# Patient Record
Sex: Female | Born: 1995 | Race: White | Hispanic: No | Marital: Married | Smoking: Never smoker
Health system: Southern US, Community
[De-identification: ages and names within clinical notes are randomized; demographics above are authoritative.]

## PROBLEM LIST (undated history)

## (undated) ENCOUNTER — Inpatient Hospital Stay (HOSPITAL_COMMUNITY): Payer: Self-pay

## (undated) ENCOUNTER — Encounter

## (undated) DIAGNOSIS — K859 Acute pancreatitis without necrosis or infection, unspecified: Secondary | ICD-10-CM

## (undated) DIAGNOSIS — E11649 Type 2 diabetes mellitus with hypoglycemia without coma: Secondary | ICD-10-CM

## (undated) DIAGNOSIS — G35D Multiple sclerosis, unspecified: Secondary | ICD-10-CM

## (undated) DIAGNOSIS — E109 Type 1 diabetes mellitus without complications: Secondary | ICD-10-CM

## (undated) DIAGNOSIS — R000 Tachycardia, unspecified: Secondary | ICD-10-CM

## (undated) DIAGNOSIS — R011 Cardiac murmur, unspecified: Secondary | ICD-10-CM

## (undated) DIAGNOSIS — G35 Multiple sclerosis: Secondary | ICD-10-CM

## (undated) HISTORY — DX: Type 2 diabetes mellitus with hypoglycemia without coma: E11.649

## (undated) HISTORY — DX: Tachycardia, unspecified: R00.0

## (undated) HISTORY — DX: Morbid (severe) obesity due to excess calories: E66.01

## (undated) HISTORY — DX: Type 1 diabetes mellitus without complications: E10.9

## (undated) HISTORY — PX: CHOLECYSTECTOMY: SHX55

---
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
## 2015-02-22 ENCOUNTER — Encounter: Payer: Self-pay | Admitting: Endocrinology

## 2015-03-22 ENCOUNTER — Encounter: Payer: Self-pay | Admitting: Internal Medicine

## 2015-03-22 ENCOUNTER — Ambulatory Visit (INDEPENDENT_AMBULATORY_CARE_PROVIDER_SITE_OTHER): Payer: 59 | Admitting: Internal Medicine

## 2015-03-22 VITALS — BP 126/72 | HR 109 | Temp 98.2°F | Resp 12 | Ht 64.75 in | Wt 246.0 lb

## 2015-03-22 DIAGNOSIS — IMO0002 Reserved for concepts with insufficient information to code with codable children: Secondary | ICD-10-CM

## 2015-03-22 DIAGNOSIS — E1065 Type 1 diabetes mellitus with hyperglycemia: Secondary | ICD-10-CM

## 2015-03-22 LAB — COMPLETE METABOLIC PANEL WITH GFR
ALT: 30 U/L (ref 5–32)
AST: 24 U/L (ref 12–32)
Albumin: 3.8 g/dL (ref 3.6–5.1)
Alkaline Phosphatase: 50 U/L (ref 47–176)
BUN: 15 mg/dL (ref 7–20)
CO2: 19 mmol/L — ABNORMAL LOW (ref 20–31)
Calcium: 8.9 mg/dL (ref 8.9–10.4)
Chloride: 103 mmol/L (ref 98–110)
Creat: 0.62 mg/dL (ref 0.50–1.00)
GFR, Est African American: >89 mL/min (ref >=60)
GFR, Est Non African American: >89 mL/min (ref >=60)
Glucose, Bld: 265 mg/dL — ABNORMAL HIGH (ref 65–99)
Potassium: 4.2 mmol/L (ref 3.8–5.1)
Sodium: 136 mmol/L (ref 135–146)
Total Bilirubin: 0.5 mg/dL (ref 0.2–1.1)
Total Protein: 6.5 g/dL (ref 6.3–8.2)

## 2015-03-22 LAB — MICROALBUMIN / CREATININE URINE RATIO
Creatinine,U: 124.7 mg/dL
Microalb Creat Ratio: 1.7 mg/g (ref 0.0–30.0)
Microalb, Ur: 2.1 mg/dL — ABNORMAL HIGH (ref 0.0–1.9)

## 2015-03-22 LAB — LIPID PANEL
Cholesterol: 160 mg/dL (ref 0–200)
HDL: 35.3 mg/dL — ABNORMAL LOW (ref >39.00)
LDL Cholesterol: 95 mg/dL (ref 0–99)
NonHDL: 124.97
Total CHOL/HDL Ratio: 5
Triglycerides: 151 mg/dL — ABNORMAL HIGH (ref 0.0–149.0)
VLDL: 30.2 mg/dL (ref 0.0–40.0)

## 2015-03-22 LAB — HEMOGLOBIN A1C: Hgb A1c MFr Bld: 8 % — ABNORMAL HIGH (ref 4.6–6.5)

## 2015-03-22 LAB — TSH: TSH: 1.38 u[IU]/mL (ref 0.40–5.00)

## 2015-03-22 MED ORDER — HUMALOG 100 UNIT/ML ~~LOC~~ SOLN: 100.0000 [IU] | Freq: Three times a day (TID) | SUBCUTANEOUS | Status: DC

## 2015-03-22 MED ORDER — INSULIN GLARGINE 100 UNIT/ML ~~LOC~~ SOLN: 60.0000 [IU] | Freq: Every day | SUBCUTANEOUS | Status: DC

## 2015-03-22 NOTE — Patient Instructions (Signed)
Please continue: - Metformin 500 mg 2x a day - Lantus 60 units at bedtime   Please continue: - Humalog ICR 1:15, target 150, ISF 10 for lunch and dinner  Change: - Humalog ICR 1:12, target 130, ISF 10 for breakfast  If you have nausea/vomiting/diarrhea >> stop Metformin right away and stay hydrated.  Please stop at the lab.  Please return in 2 months with your sugar log.   Please schedule an appt with Cristy Folks for pump training.  Basic Rules for Patients with Type I Diabetes Mellitus  1. The American Diabetes Association (ADA) recommended targets: - fasting sugar <130 - after meal sugar <180 - HbA1C <7%  2. Engage in ?150 min moderate exercise per week  3. Make sure you have ?8h of sleep every night as this helps both blood sugars and your weight.  4. Always keep a sugar log (not only record in your meter) and bring it to all appointments with [place].  5. If you are on a pump, know how to access the settings and to modify the parameters.  6.  Remember, you can always call the number on the back of the pump for emergencies related to the pump.  7. "15-15 rule" for hypoglycemia: if sugars are low, take 15 g of carbs** ("fast sugar" - e.g. 4 glucose tablets, 4 oz orange juice), wait 15 min, then check sugars again. If still <80, repeat. Continue  until your sugars >80, then eat a normal meal.   8. Teach family members and coworkers to inject glucagon. Have a glucagon set at home and one at work. They should call 911 after using the set.  9. If you are on a pump, set "insulin on board" time for 5 hours (if your sugars tend to be higher, can use 4 hours).   10. If you are on a pump, use the "dual wave bolus" setting for high fat foods (e.g. pizza). Start with a setting of 50%-50% (50% instant bolus and 50% prolonged bolus over 3h, for e.g.).    11. If you are on a pump, make sure the basal daily insulin dose is approximately equal (not larger) to the daily insulin you get  from boluses, otherwise you are at risk for hypoglycemia.  12. Check sugar before driving. If <100, correct, and only start driving if sugars rise ?638. Check sugar every hour when on a long drive.  13. Check sugar before exercising. If <100, correct, and only start exercising if sugars rise ?100. Check sugar every hour when on a long exercise routine and 1h after you finished exercising.   If >250, check urine for ketones. If you have moderate-large ketones in urine, do not start exercise. Hydrate yourself with clear liquids and correct the high sugar. Recheck sugars and ketones before attempting to exercise.  Be aware that you might need less insulin when exercising.  *intense, short, exercise bursts can increase your sugars, but  *less intense, longer (>1h), exercise routines can decrease your sugars.  If you are on a pump, you might need to decrease your basal rate by 10% or more (or even disconnect your pump) while you exercise to prevent low sugars. Do not disconnect your pump by more than 3 hours at a time! You also might need to decrease your insulin bolus for the meal prior to your exercise time by 20% or more.  14. Make sure you have a MedAlert bracelet or pendant mentioning "Type I Diabetes Mellitus". If you have a  prior episode of severe hypoglycemia or hypoglycemia unawareness, it should also mention this.  15. Please do not walk barefoot. Inspect your feet for sores/cuts and let us know if you have them.  16. Please call [facility] with any questions and concerns [phone]).   **E.g. of "fast carbs": ? first choice (15 g):  1 tube glucose gel, GlucoPouch 15, 2 oz glucose liquid ? second choice (15-16 g):  3 or 4 glucose tablets (best taken  with water), 15 Dextrose Bits chewable ? third choice (15-20 g):   cup fruit juice,  cup regular soda, 1 cup skim milk,  1 cup sports drink ? fourth choice (15-20 g):  1 small tube [place] gel (not frosting), 2  tbsp raisins, 1 tbsp table sugar,  candy, jelly beans, gum drops - check package for carb amount   (adapted from: Juluis Rainier. "Insulin therapy and hypoglycemia" Endocrinol Metab Clin N Am 2012, 41: 57-87)

## 2015-03-22 NOTE — Progress Notes (Signed)
Patient ID: Danielle Baker, female   DOB: May 08, 1996, 19 y.o.   MRN: [id number]  HPI: Danielle Baker is a 19 y.o.-year-old female, referred by her PCP, Marnette Burgess, MD, for management of DM1, uncontrolled, without complications.  Patient has been diagnosed with diabetes in 2003; she started on insulin at dx. Pt was on an insulin pump since 19 y/o >> 37 y/o: [employer] [facility] [place], with Dexcom CGM, used Novolog in the pump. She could not afford a pump. Now has insurance >> would like to get back on a pump.   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 High stress, anxiety. She manages to exercise 3 times a week: Cardio, biking, walking.  She is on: - Metformin 500 mg bid - Lantus 70 >> 60 units at bedtime (had lows on the higher dose) - Humalog ICR 1:15, target 150, ISF 10  Pt checks her sugars 2x a day and they are: - am: 71, 83-184 - 2h after b'fast: 145-229 - before lunch: 150-164 - 2h after lunch: 115-156 - before dinner: 73-127, 160 - 2h after dinner: 72, 104-148 - bedtime:185 - nighttime: n/c No lows. Lowest sugar was 80s; she has hypoglycemia awareness at 75. No previous hypoglycemia admission. She does a glucagon kit at home. Highest sugar was 300s. + previous DKA admissions - June 2016 (norovirus inf - [place] honey mood)  Pt's meals are: - Breakfast: cereal + cereal  - Lunch: school lunch - meat + veggies + grain - Dinner: protein + veggies (croskpot) + less starch - Snacks: none  - no CKD, last BUN/creatinine:  Lab Results  Component Value Date   BUN 15 09/02/2013   CREATININE 0.53 09/02/2013   - last set of lipids: Lab Results  Component Value Date   CHOL 149 04/14/2011   HDL 36 04/14/2011   LDLCALC 87 04/14/2011   TRIG 129 04/14/2011   CHOLHDL 4.1 04/14/2011   - last eye exam was in 05/2014. No DR.  - no numbness and  tingling in her feet.  She also has a history of pancreatitis - repeated episodes (chronic pancreatitis) - last episode this year. ? Cause. She has had cholecystectomy.   Last TSH: Lab Results  Component Value Date   TSH 3.660 08/31/2013   Pt has FH of DM2 in father, aunt, uncles.  ROS: Constitutional: no weight gain/loss, no fatigue, no subjective hyperthermia/hypothermia Eyes: no blurry vision, no xerophthalmia ENT: no sore throat, no nodules palpated in throat, no dysphagia/odynophagia, no hoarseness Cardiovascular: no CP/SOB/palpitations/leg swelling Respiratory: no cough/SOB Gastrointestinal: no N/V/D/C Musculoskeletal: no muscle/joint aches Skin: no rashes Neurological: no tremors/numbness/tingling/dizziness Psychiatric: no depression/+ anxiety  Past Medical History  Diagnosis Date  . Type 1 diabetes mellitus not at goal   . Hypoglycemia associated with diabetes   . DM autonomic neuropathy   . 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 Goiter   . Obesity, morbid   . Dyspepsia   . Acanthosis nigricans, acquired   . Tachycardia   . Hypertension associated with diabetes    Past Surgical History  Procedure Laterality Date  . Cholecystectomy  2012   History   Social History  . Marital Status: Single    Spouse Name: N/A  . Number of Children: 0   Occupational History  .  preschool Corporate investment banker  Social History Main Topics  . Smoking status: Never Smoker   . Smokeless tobacco: Never Used  . Alcohol Use: No  . Drug Use: No  . Sexual Activity: Yes   Social History Narrative   Newly married   [employer] - online   Works part time at a daycare   Works at [employer] time   No current outpatient prescriptions on file prior to visit.   No current facility-administered medications on file prior to visit.   Allergies  Allergen Reactions  . Enalapril Other (See Comments)    "Weak and blacked out"  couldn't stand up  . Metformin And Related Nausea And Vomiting  . Morphine And Related Rash   Family History  Problem Relation Age of Onset  . Thyroid disease Mother   . Obesity Mother   . Diabetes Father   . Obesity Father   . Obesity Sister   . Cancer Maternal Grandmother   . Obesity Paternal Grandfather     PE: BP 126/72 mmHg  Pulse 109  Temp(Src) 98.2 F (36.8 C) (Oral)  Resp 12  Ht 5' 4.75" (1.645 m)  Wt 246 lb (111.585 kg)  BMI 41.24 kg/m2  SpO2 97% Wt Readings from Last 3 Encounters:  03/22/15 246 lb (111.585 kg) (99 %*, Z = 2.48)  09/01/13 228 lb 6.3 oz (103.6 kg) (99 %*, Z = 2.28)  01/14/12 215 lb 12.8 oz (97.886 kg) (99 %*, Z = 2.21)   * Growth percentiles are based on CDC 2-20 Years data.   Constitutional: obese, in NAD Eyes: PERRLA, EOMI, no exophthalmos ENT: moist mucous membranes, no thyromegaly, no cervical lymphadenopathy Cardiovascular: tachycardiaRR, No MRG Respiratory: CTA B Gastrointestinal: abdomen soft, NT, ND, BS+ Musculoskeletal: no deformities, strength intact in all 4 Skin: moist, warm, no rashes Neurological: no tremor with outstretched hands, DTR normal in all 4  ASSESSMENT: 1. 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 Patient with long-standing, uncontrolled DM1, on basal-bolus insulin therapy, with fait control >> baseline a little higher esp. In the am and some highs at bedtime, however, the high CBGs could be after she forgets Lantus the night before. Her sugars are well controlled after she exercises (she does that before dinner). She does a good job in rechecking her sugars if they are abnormal. No lows under 70s for review of her meter download. However, she can get lower if she increases her Lantus. We'll continue with the same dose of Lantus however, I will advise her to take more insulin with breakfast - see below  instructions. - We also discussed about pumps - she is also interested in CGM. I suggested a Medtronic pump (as she needs more insulin per day) + a Dexcom CGM. I explained that the Dexcom CGM was recently approved for insulin dosing, which is great news! I will refer her to diabetes education for more discussion about pumps and maybe trying out some of them. I advised her to check her sugars at least 4 times a day from now on. I don't think we need a C-peptide since she has [employer]. - For now, I suggested the following changes in her basal-bolus regimen and we discussed about continuing her metformin (since she is obese and this may help with insulin resistance), but with strict instructions to stop it immediately when she becomes  dehydrated for various reasons.  Patient Instructions  Please continue: - Metformin 500 mg 2x a day - Lantus 60 units at bedtime   Please continue: - Humalog ICR 1:15, target 150, ISF 10 for lunch and dinner  Change: - Humalog ICR 1:12, target 130, ISF 10 for breakfast  If you have nausea/vomiting/diarrhea >> stop Metformin right away and stay hydrated.  Please stop at the lab.  Please return in 2 months with your sugar log.   Please schedule an appt with Leonia Reader for pump training.  - Strongly advised her to start checking sugars at different times of the day - check at least 4 times a day, rotating checks - given sugar log and advised how to fill it and to bring it at next appt  - given foot care handout and explained the principles  - given instructions for hypoglycemia management "15-15 rule"  - advised for yearly eye exams >> She is up-to-date - sent glucagon kit Rx to pharmacy - advised to get ketone strips - advised to always have Glu tablets with her - advised for a Med-alert bracelet mentioning "type 1 diabetes mellitus". - will refer to DM education for getting back on an insulin pump - given instruction Re: exercising and driving in  DM1 (pt instructions) - Will check a hemoglobin A1c, ACR, lipids, CMP today - no signs of other autoimmune disorders, will check a TSH today - Return to clinic in 2 mo with sugar log   - time spent with the patient: 1 hour, of which >50% was spent in obtaining information about her diabetes, reviewing previous labs, hospitalization records [facility], and DM treatments, counseling pt about her condition (please see the discussed topics above), and developing a plan to prevent further hypoglycemia and hyperglycemia. We also discussed about proper diet. Pt had a number of questions which I addressed.  Office Visit on 03/22/2015  Component Date Value Ref Range Status  . Hgb A1c MFr Bld 03/22/2015 8.0* 4.6 - 6.5 % Final   Glycemic Control Guidelines for People with Diabetes:Non Diabetic:  <6%Goal of Therapy: <7%Additional Action Suggested:  >8%   . Sodium 03/22/2015 136  135 - 146 mmol/L Final  . Potassium 03/22/2015 4.2  3.8 - 5.1 mmol/L Final  . Chloride 03/22/2015 103  98 - 110 mmol/L Final  . CO2 03/22/2015 19* 20 - 31 mmol/L Final  . Glucose, Bld 03/22/2015 265* 65 - 99 mg/dL Final  . BUN 03/22/2015 15  7 - 20 mg/dL Final  . Creat 03/22/2015 0.62  0.50 - 1.00 mg/dL Final  . Total Bilirubin 03/22/2015 0.5  0.2 - 1.1 mg/dL Final  . Alkaline Phosphatase 03/22/2015 50  47 - 176 U/L Final  . AST 03/22/2015 24  12 - 32 U/L Final  . ALT 03/22/2015 30  5 - 32 U/L Final  . Total Protein 03/22/2015 6.5  6.3 - 8.2 g/dL Final  . Albumin 03/22/2015 3.8  3.6 - 5.1 g/dL Final  . Calcium 03/22/2015 8.9  8.9 - 10.4 mg/dL Final  . GFR, Est African American 03/22/2015 >89  >=60 mL/min Final  . GFR, Est Non African American 03/22/2015 >89  >=60 mL/min Final   Comment:   The estimated GFR is a calculation valid for adults (>=45 years old) that uses the CKD-EPI algorithm to adjust for age and sex. It is   not to be used for children, pregnant women, hospitalized patients,    patients on dialysis, or with  rapidly changing  kidney function. According to the NKDEP, eGFR >89 is normal, 60-89 shows mild impairment, 30-59 shows moderate impairment, 15-29 shows severe impairment and <15 is ESRD.     Footnotes:  (1) ** Please note change in unit of measure and reference range(s). **     . Cholesterol 03/22/2015 160  0 - 200 mg/dL Final   ATP III Classification       Desirable:  < 200 mg/dL               Borderline High:  200 - 239 mg/dL          High:  > = 240 mg/dL  . Triglycerides 03/22/2015 151.0* 0.0 - 149.0 mg/dL Final   Normal:  <150 mg/dLBorderline High:  150 - 199 mg/dL  . HDL 03/22/2015 35.30* >39.00 mg/dL Final  . VLDL 03/22/2015 30.2  0.0 - 40.0 mg/dL Final  . LDL Cholesterol 03/22/2015 95  0 - 99 mg/dL Final  . Total CHOL/HDL Ratio 03/22/2015 5   Final                  Men          Women1/2 Average Risk     3.4          3.3Average Risk          5.0          4.42X Average Risk          9.6          7.13X Average Risk          15.0          11.0                      . NonHDL 03/22/2015 124.97   Final   NOTE:  Non-HDL goal should be 30 mg/dL higher than patient's LDL goal (i.e. LDL goal of < 70 mg/dL, would have non-HDL goal of < 100 mg/dL)  . Microalb, Ur 03/22/2015 2.1* 0.0 - 1.9 mg/dL Final  . Creatinine,U 03/22/2015 124.7   Final  . Microalb Creat Ratio 03/22/2015 1.7  0.0 - 30.0 mg/g Final  . TSH 03/22/2015 1.38  0.40 - 5.00 uIU/mL Final   Glucose is high, but liver, kidney, thyroid tests are normal.  Urine proteins are not high.  Lipids close to goal (nonfasting).  HbA1c has improved!

## 2015-03-23 ENCOUNTER — Other Ambulatory Visit: Payer: Self-pay

## 2015-03-26 ENCOUNTER — Encounter: Payer: Self-pay | Admitting: *Deleted

## 2015-03-26 ENCOUNTER — Telehealth: Payer: Self-pay | Admitting: Internal Medicine

## 2015-03-26 NOTE — Telephone Encounter (Signed)
[facility] calling for clarification on the humalog vial rx directions # [phone] ref #[id number]

## 2015-03-27 NOTE — Telephone Encounter (Signed)
Called [facility]. Spoke with Pharmacist and tech. They want to clarify the dosage of the pt's humalog. I read what was ordered. They want the physician to review and clarify. Please review and clarify for me, then I will contact them to advise. Thank you.

## 2015-03-27 NOTE — Telephone Encounter (Signed)
Let's send Rx for Humalog vial - up to 100 units a day in insulin pump, # 9 vials, with 1 refill

## 2015-03-28 MED ORDER — HUMALOG 100 UNIT/ML ~~LOC~~ SOLN: SUBCUTANEOUS | Status: DC

## 2015-03-28 NOTE — Telephone Encounter (Signed)
Resent Humalog vial - up to 100 units daily in insulin pump. #9 vials, w/1 refill. To [facility].

## 2015-03-28 NOTE — Addendum Note (Signed)
Addended by: Adline Mango B on: 03/28/2015 11:02 AM   Modules accepted: Orders

## 2015-04-02 ENCOUNTER — Ambulatory Visit: Payer: Managed Care, Other (non HMO) | Admitting: Nutrition

## 2015-04-09 ENCOUNTER — Ambulatory Visit: Payer: Managed Care, Other (non HMO) | Admitting: Nutrition

## 2015-06-14 ENCOUNTER — Encounter: Payer: Self-pay | Admitting: *Deleted

## 2015-06-14 ENCOUNTER — Ambulatory Visit: Payer: Managed Care, Other (non HMO) | Admitting: Internal Medicine

## 2015-06-14 DIAGNOSIS — Z0289 Encounter for other administrative examinations: Secondary | ICD-10-CM

## 2015-07-27 ENCOUNTER — Ambulatory Visit (INDEPENDENT_AMBULATORY_CARE_PROVIDER_SITE_OTHER): Payer: Managed Care, Other (non HMO) | Admitting: Internal Medicine

## 2015-07-27 ENCOUNTER — Encounter: Payer: Self-pay | Admitting: Internal Medicine

## 2015-07-27 ENCOUNTER — Other Ambulatory Visit (INDEPENDENT_AMBULATORY_CARE_PROVIDER_SITE_OTHER): Payer: Managed Care, Other (non HMO) | Admitting: *Deleted

## 2015-07-27 VITALS — BP 118/64 | HR 102 | Temp 97.9°F | Resp 12 | Wt 248.6 lb

## 2015-07-27 DIAGNOSIS — E1065 Type 1 diabetes mellitus with hyperglycemia: Secondary | ICD-10-CM

## 2015-07-27 LAB — POCT GLYCOSYLATED HEMOGLOBIN (HGB A1C): Hemoglobin A1C: 7.7

## 2015-07-27 NOTE — Progress Notes (Signed)
Patient ID: Danielle Baker, female   DOB: 05/16/1996, 19 y.o.   MRN: [id number]  HPI: [name] is a 19 y.o.-year-old female, returning for f/u for DM1, uncontrolled, without complications. Last visit 4 mo ago. She just found out she is pregnant!  Reviewed hx: Patient has been diagnosed with diabetes in 2003; she started on insulin at dx. Pt was on an insulin pump since 19 y/o >> 17 y/o: [employer] and [place], with Dexcom CGM, used Novolog in the pump. She could not afford a pump. Now has insurance >> would like to get back on a pump. I referred her to DM education at last visit to get prepump training >> did not go yet, but she is planning to do this.  Last hemoglobin A1c was: Lab Results  Component Value Date   HGBA1C 8.0* 03/22/2015   HGBA1C 9.1* 08/31/2013   HGBA1C 8.9 01/14/2012   She exercises 3 times a week: Cardio, biking, walking.  She is on: - Lantus 60 >> 50 units at bedtime  - Humalog  ICR 1:15, target 150, ISF 10 for lunch and dinner ICR 1:12, target 130, ISF 10 for breakfast She stopped Metformin 500 mg bid >> diarrhea, nausea  Pt checks her sugars 2x a day and they are: - am: 71, 83-184 >> 97-136, 158 - 2h after b'fast: 145-229 >> 113-122, 210 - before lunch: 150-164 >> 77 - 2h after lunch: 115-156 >> 128-263 - 1-3 pm: 74-127 - before dinner: 73-127, 160 >> 58-120, 167 - 2h after dinner: 72, 104-148 >> 91-182, 218 - bedtime:185 >> n/c - nighttime: n/c No lows. Lowest sugar was 58; she has hypoglycemia awareness at 75. No previous hypoglycemia admission. She does a glucagon kit at home. Highest sugar was 200s.. + previous DKA admissions - June 2016 (norovirus inf - [place] honey mood)  Pt's meals are: - Breakfast: cereal + milk (12 units) - Lunch: school lunch - meat + veggies + grain (14 units) - Dinner: protein + veggies (croskpot) + less starch (16 units) - Snacks: none  - no CKD, last BUN/creatinine:  Lab Results  Component Value Date   BUN 15 03/22/2015    CREATININE 0.62 03/22/2015   - last set of lipids: Lab Results  Component Value Date   CHOL 160 03/22/2015   HDL 35.30* 03/22/2015   LDLCALC 95 03/22/2015   TRIG 151.0* 03/22/2015   CHOLHDL 5 03/22/2015   - last eye exam was in 05/2014. No DR.  - no numbness and tingling in her feet.  She also has a history of pancreatitis - repeated episodes (chronic pancreatitis) - last episode this year. ? Cause. She has had cholecystectomy.   Last TSH: Lab Results  Component Value Date   TSH 1.38 03/22/2015   ROS: Constitutional: no weight gain/loss, no fatigue, no subjective hyperthermia/hypothermia Eyes: no blurry vision, no xerophthalmia ENT: no sore throat, no nodules palpated in throat, no dysphagia/odynophagia, no hoarseness Cardiovascular: no CP/SOB/palpitations/leg swelling Respiratory: no cough/SOB Gastrointestinal: no N/V/D/C Musculoskeletal: no muscle/joint aches Skin: no rashes Neurological: no tremors/numbness/tingling/dizziness  I reviewed pt's medications, allergies, PMH, social hx, family hx, and changes were documented in the history of present illness. Otherwise, unchanged from my initial visit note.  Past Medical History  Diagnosis Date  . Type 1 diabetes mellitus not at goal (HCC)   . Hypoglycemia associated with diabetes (HCC)   . DM autonomic neuropathy (HCC)   . 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 Goiter   .   Obesity, morbid (HCC)   . Dyspepsia   . Acanthosis nigricans, acquired   . Tachycardia   . Hypertension associated with diabetes (HCC)    Past Surgical History  Procedure Laterality Date  . Cholecystectomy  2012   History   Social History  . Marital Status: Single    Spouse Name: N/A  . Number of Children: 0   Occupational History  .  preschool teacher/school nutrition assistant    Social History Main Topics  . Smoking status: Never Smoker   . Smokeless tobacco: Never Used  . Alcohol Use: No  . Drug Use: No   . Sexual Activity: Yes   Social History Narrative   Newly married   [employer] - online   Works part time at a daycare   Works at a High School Full time   Current Outpatient Prescriptions on File Prior to Visit  Medication Sig Dispense Refill  . HUMALOG 100 UNIT/ML injection Use up to 100 units daily in [place] pump. Use a sliding scale: 1:15 carb: 1:10 for correction 90 mL 1  . insulin glargine (LANTUS) 100 UNIT/ML injection Inject 0.6 mLs (60 Units total) into the skin at bedtime. 30 mL 1  . metFORMIN (GLUCOPHAGE) 500 MG tablet Take 500 mg by mouth 2 (two) times daily with a meal.      No current facility-administered medications on file prior to visit.   Allergies  Allergen Reactions  . Enalapril Other (See Comments)    "Weak and blacked out" couldn't stand up  . Metformin And Related Nausea And Vomiting  . Morphine And Related Rash   Family History  Problem Relation Age of Onset  . Thyroid disease Mother   . Obesity Mother   . Diabetes Father   . Obesity Father   . Obesity Sister   . Cancer Maternal Grandmother   . Obesity Paternal Grandfather     PE: BP 118/64 mmHg  Pulse 102  Temp(Src) 97.9 F (36.6 C) (Oral)  Resp 12  Wt 248 lb 9.6 oz (112.764 kg)  SpO2 99% Body mass index is 41.67 kg/(m^2). Wt Readings from Last 3 Encounters:  07/27/15 248 lb 9.6 oz (112.764 kg)  03/22/15 246 lb (111.585 kg) (99 %*, Z = 2.48)  09/01/13 228 lb 6.3 oz (103.6 kg) (99 %*, Z = 2.28)   * Growth percentiles are based on CDC 2-20 Years data.   Constitutional: obese, in NAD Eyes: PERRLA, EOMI, no exophthalmos ENT: moist mucous membranes, no thyromegaly, no cervical lymphadenopathy Cardiovascular: tachycardiaRR, No MRG Respiratory: CTA B Gastrointestinal: abdomen soft, NT, ND, BS+ Musculoskeletal: no deformities, strength intact in all 4 Skin: moist, warm, no rashes Neurological: no tremor with outstretched hands, DTR normal in all 4  ASSESSMENT: 1. 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 Patient with long-standing, uncontrolled DM1, on basal-bolus insulin therapy, with improved control to the point of lows in the afternoon, but with persistent highs after meals. Since she is dropping her sugars in the afternoon if she prolongs dinner, I will decrease her Lantus dose, but since her sugars are higher 2 hours after meals, I would increase the ICR for dinner. To avoid lows in the afternoon and at night, I will reduce the ISF for lunch and dinner. We will also decrease all her targets to 120. This will probably be even lower   when she starts the pump. - We discussed about CBG targets during the pregnancy, which are more strict then outside pregnancy: - discussed goals of CBGs in pregnancy: Before meals <95, 1h after a meal <140 2h after a meal <120 - We again discussed about pumps - she is also interested in CGM. I suggested a Medtronic pump (as she needs more insulin per day) + a Dexcom CGM.  I don't think we need a C-peptide since she has [employer]. She agrees to retry to schedule the appointment with the diabetes educator for pre-pump training. - For now, I suggested the following changes in her basal-bolus regimen: Patient Instructions  Please decrease Lantus to 45 units at bedtime (may need to decrease further to 40). Please change the Humalog: ICR 1:12, target 120, ISF 10 for breakfast  ICR 1:14, target 120, ISF 20 for lunch ICR 1:12, target 120, ISF 20 for dinner  Please return in 1.5 months with your sugar log.   - Strongly advised her to start checking sugars at different times of the day - check at least 4 times a day, rotating checks - advised for yearly eye exams >> She needs a new eye exam - Will check a hemoglobin A1c >> 7.7% (better), but we discussed that this is not sufficiently low for pregnancy - no  signs of other autoimmune disorders - Return to clinic in 1-1.5 mo with sugar log, hopefully approximately 1 month after she starts the pump   - time spent with the patient: 40 min, of which >50% was spent in reviewing her glucometer downloads, discussing her hypo- and hyper-glycemic episodes, reviewing previous insulin doses, discussing about diabetes management during pregnancy, and developing a plan to avoid hypo- and hyper-glycemia.

## 2015-07-27 NOTE — Patient Instructions (Signed)
Please decrease Lantus to 45 units at bedtime (may need to decrease further to 40). Please change the Humalog: ICR 1:12, target 120, ISF 10 for breakfast  ICR 1:14, target 120, ISF 20 for lunch ICR 1:12, target 120, ISF 20 for dinner  Please return in 1.5 months with your sugar log.

## 2015-08-01 ENCOUNTER — Other Ambulatory Visit: Payer: Self-pay | Admitting: Obstetrics and Gynecology

## 2015-08-01 LAB — OB RESULTS CONSOLE HIV ANTIBODY (ROUTINE TESTING): HIV: NONREACTIVE

## 2015-08-01 LAB — OB RESULTS CONSOLE HEPATITIS B SURFACE ANTIGEN: Hepatitis B Surface Ag: NEGATIVE

## 2015-08-01 LAB — OB RESULTS CONSOLE GC/CHLAMYDIA
Chlamydia: NEGATIVE
Gonorrhea: NEGATIVE

## 2015-08-01 LAB — OB RESULTS CONSOLE RPR: RPR: NONREACTIVE

## 2015-08-01 LAB — OB RESULTS CONSOLE ABO/RH: RH Type: POSITIVE

## 2015-08-01 LAB — OB RESULTS CONSOLE RUBELLA ANTIBODY, IGM: Rubella: IMMUNE

## 2015-08-01 LAB — OB RESULTS CONSOLE ANTIBODY SCREEN: Antibody Screen: NEGATIVE

## 2015-08-14 ENCOUNTER — Encounter: Payer: Managed Care, Other (non HMO) | Admitting: Nutrition

## 2015-09-28 ENCOUNTER — Ambulatory Visit (INDEPENDENT_AMBULATORY_CARE_PROVIDER_SITE_OTHER): Payer: BLUE CROSS/BLUE SHIELD | Admitting: Internal Medicine

## 2015-09-28 ENCOUNTER — Encounter: Payer: Self-pay | Admitting: Internal Medicine

## 2015-09-28 ENCOUNTER — Other Ambulatory Visit: Payer: Self-pay | Admitting: *Deleted

## 2015-09-28 VITALS — BP 120/82 | HR 92 | Temp 97.8°F | Resp 12 | Wt 254.0 lb

## 2015-09-28 DIAGNOSIS — E1065 Type 1 diabetes mellitus with hyperglycemia: Secondary | ICD-10-CM

## 2015-09-28 MED ORDER — INSULIN ASPART 100 UNIT/ML ~~LOC~~ SOLN: 100.0000 [IU] | Freq: Every day | SUBCUTANEOUS | Status: DC

## 2015-09-28 MED ORDER — HUMALOG 100 UNIT/ML ~~LOC~~ SOLN
SUBCUTANEOUS | Status: DC
Start: 2015-09-28 — End: 2015-09-28

## 2015-09-28 MED ORDER — INSULIN GLARGINE 100 UNIT/ML ~~LOC~~ SOLN: 45.0000 [IU] | Freq: Every day | SUBCUTANEOUS | Status: DC

## 2015-09-28 NOTE — Progress Notes (Signed)
Patient ID: Danielle Baker, female   DOB: 07-Jun-1996, 20 y.o.   MRN: [id number]  HPI: Danielle Baker is a 20 y.o.-year-old female, 20 returning for f/u for DM1, uncontrolled, without complications. Last visit 4 mo ago. She is now [redacted] week pregnant.  Reviewed hx: Patient has been diagnosed with diabetes in 2003; she started on insulin at dx. Pt was on an insulin pump since 20 y/o >> 10 y/o: [place], with Dexcom CGM, used Novolog in the pump. She could not afford a pump. Now has insurance >> would like to get back on a pump. I referred her to DM education at last visit to get prepump training >> did not go yet, but she is planning to do this.  Last hemoglobin A1c was: Lab Results  Component Value Date   HGBA1C 7.7 07/27/2015   HGBA1C 8.0* 03/22/2015   HGBA1C 9.1* 08/31/2013   She exercises 3 times a week: Cardio, biking, walking.  She is on: - Lantus 60 >> 50 >> 45 units at bedtime  - Humalog  ICR 1:12, target 120, ISF 10 for breakfast (10-12 units) ICR 1:14, target 120, ISF 20 for lunch  (10-12 units) ICR 1:12, target 120, ISF 20 for dinner (10-12 units) She stopped Metformin 500 mg bid >> diarrhea, nausea  Pt checks her sugars 2x a day and they are: - am: 71, 83-184 >> 97-136, 158 >> 55-345 - had 3 days of very high sugars in am >> changed insulin >> recovered. Now mostly low CBGs - 2h after b'fast: 145-229 >> 113-122, 210 >> 89-132, 204 (the day she had a 312 in am) - before lunch: 150-164 >> 77 >> 131 - 2h after lunch: 115-156 >> 128-263 >> 67-166, 193 - before dinner: 73-127, 160 >> 58-120, 167 >> 113-147, 162, 340 - 2h after dinner: 72, 104-148 >> 91-182, 218 >> 216 - bedtime:185 >> n/c - nighttime: n/c Lowest sugar was 58 >> 55; she has hypoglycemia awareness at 75. No previous hypoglycemia admission. She does a glucagon kit at home. Highest sugar was 200s >> 300s. + previous DKA admissions - June 2016 (norovirus inf - [place] honey mood)  Pt's meals are: - Breakfast: cereal  + milk (12 units) - Lunch: school lunch - meat + veggies + grain (14 units) - Dinner: protein + veggies (croskpot) + less starch (16 units) - Snacks: none  - no CKD, last BUN/creatinine:  Lab Results  Component Value Date   BUN 15 03/22/2015   CREATININE 0.62 03/22/2015   - last set of lipids: Lab Results  Component Value Date   CHOL 160 03/22/2015   HDL 35.30* 03/22/2015   LDLCALC 95 03/22/2015   TRIG 151.0* 03/22/2015   CHOLHDL 5 03/22/2015   - last eye exam was in 05/2014. No DR.  - no numbness and tingling in her feet.  She also has a history of pancreatitis - repeated episodes (chronic pancreatitis) - last episode this year. ? Cause. She has had cholecystectomy.   Last TSH: Lab Results  Component Value Date   TSH 1.38 03/22/2015   ROS: Constitutional: no weight gain/loss, no fatigue, no subjective hyperthermia/hypothermia Eyes: no blurry vision, no xerophthalmia ENT: no sore throat, no nodules palpated in throat, no dysphagia/odynophagia, no hoarseness Cardiovascular: no CP/SOB/palpitations/leg swelling Respiratory: no cough/SOB Gastrointestinal: no N/V/D/C Musculoskeletal: no muscle/joint aches Skin: no rashes Neurological: no tremors/numbness/tingling/dizziness  I reviewed pt's medications, allergies, PMH, social hx, family hx, and changes were documented in the history of present  illness. Otherwise, unchanged from my initial visit note.  Past Medical History  Diagnosis Date  . Type 1 diabetes mellitus not at goal [facility])   . Hypoglycemia associated with diabetes (HCC)   . DM autonomic neuropathy (HCC)   . 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 Goiter   . Obesity, morbid (HCC)   . Dyspepsia   . Acanthosis nigricans, acquired   . Tachycardia   . Hypertension associated with diabetes [facility])    Past Surgical History  Procedure Laterality Date  . Cholecystectomy  2012   History   Social History  . Marital Status: Single     Spouse Name: N/A  . Number of Children: 0   Occupational History  .  preschool teacher/school nutrition assistant    Social History Main Topics  . Smoking status: Never Smoker   . Smokeless tobacco: Never Used  . Alcohol Use: No  . Drug Use: No  . Sexual Activity: Yes   Social History Narrative   Newly married   [employer] - online   Works part time at a daycare   Works at [employer] time   Current Outpatient Prescriptions on File Prior to Visit  Medication Sig Dispense Refill  . HUMALOG 100 UNIT/ML injection Use up to 100 units daily in [place] pump. Use a sliding scale: 1:15 carb: 1:10 for correction 90 mL 1  . insulin glargine (LANTUS) 100 UNIT/ML injection Inject 0.6 mLs (60 Units total) into the skin at bedtime. 30 mL 1  . metFORMIN (GLUCOPHAGE) 500 MG tablet Take 500 mg by mouth 2 (two) times daily with a meal. Reported on 09/28/2015     No current facility-administered medications on file prior to visit.   Allergies  Allergen Reactions  . Enalapril Other (See Comments)    "Weak and blacked out" couldn't stand up  . Metformin And Related Nausea And Vomiting  . Morphine And Related Rash   Family History  Problem Relation Age of Onset  . Thyroid disease Mother   . Obesity Mother   . Diabetes Father   . Obesity Father   . Obesity Sister   . Cancer Maternal Grandmother   . Obesity Paternal Grandfather    PE: BP 120/82 mmHg  Pulse 92  Temp(Src) 97.8 F (36.6 C) (Oral)  Resp 12  Wt 254 lb (115.214 kg)  SpO2 99% Body mass index is 42.58 kg/(m^2). Wt Readings from Last 3 Encounters:  09/28/15 254 lb (115.214 kg)  07/27/15 248 lb 9.6 oz (112.764 kg)  03/22/15 246 lb (111.585 kg) (99 %*, Z = 2.48)   * Growth percentiles are based on CDC 2-20 Years data.   Constitutional: obese, in NAD Eyes: PERRLA, EOMI, no exophthalmos ENT: moist mucous membranes, no thyromegaly, no cervical lymphadenopathy Cardiovascular: tachycardiaRR, No MRG Respiratory:  CTA B Gastrointestinal: abdomen soft, NT, ND, BS+ Musculoskeletal: no deformities, strength intact in all 4 Skin: moist, warm, no rashes Neurological: no tremor with outstretched hands, DTR normal in all 4  ASSESSMENT: 1. 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 Patient with long-standing, uncontrolled DM1, on basal-bolus insulin therapy, with improved control to the point of lows in the am >> will split Lantus in 2 doses. Her high CBGs in am resolved after she changed her insulin. - she is aware about CBG targets  during the pregnancy, which are more strict then outside pregnancy: Before meals <95, 1h after a meal <140 2h after a meal <120 - We again discussed about pumps - she is also interested in CGM. I suggested a Medtronic pump (as she needs more insulin per day) + a Dexcom CGM.  I don't think we need a C-peptide since she has [employer]. She agrees to retry to schedule the appointment with the diabetes educator for pre-pump training as she did not have this yet - For now, I suggested the following changes in her basal-bolus regimen:  Patient Instructions  Please continue Lantus to 45 units but decrease to 30 units at bedtime and 15 units in am.  Please continue the Humalog: ICR 1:12, target 120, ISF 10 for breakfast  ICR 1:14, target 120, ISF 20 for lunch ICR 1:12, target 120, ISF 20 for dinner  Please return in 1.5 months with your sugar log.   - Strongly advised her to start checking sugars at different times of the day - check at least 4 times a day, rotating checks - advised for yearly eye exams >> She needs a new eye exam - Will check a hemoglobin A1c at next visit - no signs of other autoimmune disorders - Return to clinic in 1-1.5 mo with sugar log, hopefully approximately 1 month after she starts the pump

## 2015-09-28 NOTE — Telephone Encounter (Signed)
Ins does not cover Humalog. Switched to [employer].

## 2015-09-28 NOTE — Patient Instructions (Addendum)
Patient Instructions  Please continue Lantus to 45 units but decrease to 30 units at bedtime and 15 units in am.  Please continue the Humalog: ICR 1:12, target 120, ISF 10 for breakfast  ICR 1:14, target 120, ISF 20 for lunch ICR 1:12, target 120, ISF 20 for dinner  Please return in 1.5 months with your sugar log.

## 2015-11-04 ENCOUNTER — Encounter (HOSPITAL_COMMUNITY): Payer: Self-pay | Admitting: Certified Nurse Midwife

## 2015-11-04 ENCOUNTER — Inpatient Hospital Stay (HOSPITAL_COMMUNITY)
Admission: AD | Admit: 2015-11-04 | Discharge: 2015-11-04 | Disposition: A | Discharge status: Home / Self Care | Payer: BLUE CROSS/BLUE SHIELD | Source: Ambulatory Visit | Attending: Obstetrics and Gynecology | Admitting: Obstetrics and Gynecology

## 2015-11-04 DIAGNOSIS — E1043 Type 1 diabetes mellitus with diabetic autonomic (poly)neuropathy: Secondary | ICD-10-CM | POA: Diagnosis not present

## 2015-11-04 DIAGNOSIS — J111 Influenza due to unidentified influenza virus with other respiratory manifestations: Secondary | ICD-10-CM | POA: Insufficient documentation

## 2015-11-04 DIAGNOSIS — O98512 Other viral diseases complicating pregnancy, second trimester: Secondary | ICD-10-CM | POA: Diagnosis not present

## 2015-11-04 DIAGNOSIS — O24012 Pre-existing diabetes mellitus, type 1, in pregnancy, second trimester: Secondary | ICD-10-CM | POA: Insufficient documentation

## 2015-11-04 DIAGNOSIS — Z3A20 20 weeks gestation of pregnancy: Secondary | ICD-10-CM | POA: Insufficient documentation

## 2015-11-04 DIAGNOSIS — Z833 Family history of diabetes mellitus: Secondary | ICD-10-CM | POA: Diagnosis not present

## 2015-11-04 DIAGNOSIS — Z888 Allergy status to other drugs, medicaments and biological substances status: Secondary | ICD-10-CM | POA: Diagnosis not present

## 2015-11-04 DIAGNOSIS — Z8349 Family history of other endocrine, nutritional and metabolic diseases: Secondary | ICD-10-CM | POA: Diagnosis not present

## 2015-11-04 DIAGNOSIS — Z885 Allergy status to narcotic agent status: Secondary | ICD-10-CM | POA: Diagnosis not present

## 2015-11-04 LAB — CBC
HCT: 38 % (ref 36.0–46.0)
Hemoglobin: 13 g/dL (ref 12.0–15.0)
MCH: 29.7 pg (ref 26.0–34.0)
MCHC: 34.2 g/dL (ref 30.0–36.0)
MCV: 87 fL (ref 78.0–100.0)
Platelets: 237 10*3/uL (ref 150–400)
RBC: 4.37 MIL/uL (ref 3.87–5.11)
RDW: 13.6 % (ref 11.5–15.5)
WBC: 8.3 10*3/uL (ref 4.0–10.5)

## 2015-11-04 LAB — URINALYSIS, ROUTINE W REFLEX MICROSCOPIC
Bilirubin Urine: NEGATIVE
Glucose, UA: NEGATIVE mg/dL
Hgb urine dipstick: NEGATIVE
Ketones, ur: 15 mg/dL — AB
Leukocytes, UA: NEGATIVE
Nitrite: NEGATIVE
Protein, ur: NEGATIVE mg/dL
Specific Gravity, Urine: 1.01 (ref 1.005–1.030)
pH: 6.5 (ref 5.0–8.0)

## 2015-11-04 LAB — COMPREHENSIVE METABOLIC PANEL
ALT: 28 U/L (ref 14–54)
AST: 31 U/L (ref 15–41)
Albumin: 2.9 g/dL — ABNORMAL LOW (ref 3.5–5.0)
Alkaline Phosphatase: 89 U/L (ref 38–126)
Anion gap: 5 (ref 5–15)
BUN: 7 mg/dL (ref 6–20)
CO2: 20 mmol/L — ABNORMAL LOW (ref 22–32)
Calcium: 7.9 mg/dL — ABNORMAL LOW (ref 8.9–10.3)
Chloride: 112 mmol/L — ABNORMAL HIGH (ref 101–111)
Creatinine, Ser: 0.41 mg/dL — ABNORMAL LOW (ref 0.44–1.00)
GFR calc Af Amer: >60 mL/min (ref >60)
GFR calc non Af Amer: >60 mL/min (ref >60)
Glucose, Bld: 133 mg/dL — ABNORMAL HIGH (ref 65–99)
Potassium: 3.8 mmol/L (ref 3.5–5.1)
Sodium: 137 mmol/L (ref 135–145)
Total Bilirubin: 0.5 mg/dL (ref 0.3–1.2)
Total Protein: 6 g/dL — ABNORMAL LOW (ref 6.5–8.1)

## 2015-11-04 LAB — GLUCOSE, CAPILLARY: Glucose-Capillary: 55 mg/dL — ABNORMAL LOW (ref 65–99)

## 2015-11-04 MED ORDER — DEXTROSE-NACL 5-0.9 % IV SOLN
1000.0000 mL | Freq: Once | INTRAVENOUS | Status: AC
  Administered 2015-11-04: 1000 mL via INTRAVENOUS

## 2015-11-04 MED ORDER — OSELTAMIVIR PHOSPHATE 75 MG PO CAPS: 75.0000 mg | ORAL_CAPSULE | Freq: Two times a day (BID) | ORAL | Status: DC

## 2015-11-04 MED ORDER — ONDANSETRON 4 MG PO TBDP: 4.0000 mg | ORAL_TABLET | Freq: Four times a day (QID) | ORAL | Status: DC | PRN

## 2015-11-04 MED ORDER — PROMETHAZINE HCL 25 MG RE SUPP: 25.0000 mg | Freq: Four times a day (QID) | RECTAL | Status: DC | PRN

## 2015-11-04 MED ORDER — DM-GUAIFENESIN ER 30-600 MG PO TB12
1.0000 | ORAL_TABLET | Freq: Once | ORAL | Status: AC
  Administered 2015-11-04: 1 via ORAL
  Filled 2015-11-04: qty 1

## 2015-11-04 NOTE — Discharge Instructions (Signed)

## 2015-11-04 NOTE — MAU Note (Signed)
Pt arrives via EMS from [facility]. Pt + for influenza A per sending hospital. Type 1 diabetic.

## 2015-11-04 NOTE — MAU Provider Note (Signed)
History     CSN: [id number]  Arrival date and time: 11/04/15 1825   First Provider Initiated Contact with Patient 11/04/15 1859      Chief Complaint  Patient presents with  . Influenza  . Emesis   HPI Pt is 20 yo [redacted]w[redacted]d G1P0 who is transferred here from [facility] type I Diabetic who was diagnosed with Type A Influenza, having cough, congestion, body aches and vomiting.  Pt had chest congestion with neg CXR.  Pt given DuoNeb treatment with improvement. Pt was having nausea and vomiting that began yesterday with vomiting 10-15 times.  Today, pt had not been able to keep anything down including her medications today.  Pt has hx of pancreatitis in the past. Blood Sugar was 150 at 12 noon, took 4 units Regular Insulin  Pt was sent here for possible DKA When pt seen in MAU- pt states that she has mostly has control over her Nausea and vomiting now, but just is uncomfortable with chest congestion Past Medical History  Diagnosis Date  . Type 1 diabetes mellitus not at goal [facility])   . Hypoglycemia associated with diabetes (HCC)   . DM autonomic neuropathy (HCC)   . 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 Goiter   . Obesity, morbid (HCC)   . Dyspepsia   . Acanthosis nigricans, acquired   . Tachycardia   . Hypertension associated with diabetes [facility])     Past Surgical History  Procedure Laterality Date  . Cholecystectomy      Family History  Problem Relation Age of Onset  . Thyroid disease Mother   . Obesity Mother   . Diabetes Father   . Obesity Father   . Obesity Sister   . Cancer Maternal Grandmother   . Obesity Paternal Grandfather     Social History  Substance Use Topics  . Smoking status: Never Smoker   . Smokeless tobacco: Never Used  . Alcohol Use: No    Allergies:  Allergies  Allergen Reactions  . Enalapril Other (See Comments)    "Weak and blacked out" couldn't stand up  . Metformin And Related Nausea And Vomiting   . Morphine And Related Rash    Prescriptions prior to admission  Medication Sig Dispense Refill Last Dose  . insulin aspart (NOVOLOG) 100 UNIT/ML injection Inject 100 Units into the skin daily. 30 mL 2 11/03/2015 at Unknown time  . insulin glargine (LANTUS) 100 UNIT/ML injection Inject 0.45 mLs (45 Units total) into the skin at bedtime. 30 mL 11 11/03/2015 at Unknown time  . Prenatal Vit-Fe Fumarate-FA (MULTIVITAMIN-PRENATAL) 27-0.8 MG TABS tablet Take 1 tablet by mouth daily at 12 noon.    11/03/2015 at Unknown time    Review of Systems  Constitutional: Positive for fever and chills.  Respiratory: Positive for cough.   Cardiovascular: Positive for chest pain.  Gastrointestinal: Positive for nausea and vomiting.  Neurological: Positive for headaches.   Physical Exam   Blood pressure 131/70, pulse 118, temperature 98.4 F (36.9 C), temperature source Oral, resp. rate 20, SpO2 98 %.  Physical Exam  Nursing note and vitals reviewed. Constitutional: She is oriented to person, place, and time. She appears well-developed and well-nourished. No distress.  HENT:  Head: Normocephalic.  Eyes: Pupils are equal, round, and reactive to light.  Neck: Normal range of motion. Neck supple.  Cardiovascular: Normal rate.   Respiratory: Effort normal.  Musculoskeletal: Normal range of motion.  Neurological: She is alert and oriented to  person, place, and time.  Skin: Skin is warm and dry.  Psychiatric: She has a normal mood and affect.     MAU Course  Procedures Results for orders placed or performed during the hospital encounter of 11/04/15 (from the past 24 hour(s))  Glucose, capillary     Status: Abnormal   Collection Time: 11/04/15  7:33 PM  Result Value Ref Range   Glucose-Capillary 55 (L) 65 - 99 mg/dL  Urinalysis, Routine w reflex microscopic (not at [facility])     Status: Abnormal   Collection Time: 11/04/15  8:00 PM  Result Value Ref Range   Color, Urine YELLOW YELLOW   APPearance  CLEAR CLEAR   Specific Gravity, Urine 1.010 1.005 - 1.030   pH 6.5 5.0 - 8.0   Glucose, UA NEGATIVE NEGATIVE mg/dL   Hgb urine dipstick NEGATIVE NEGATIVE   Bilirubin Urine NEGATIVE NEGATIVE   Ketones, ur 15 (A) NEGATIVE mg/dL   Protein, ur NEGATIVE NEGATIVE mg/dL   Nitrite NEGATIVE NEGATIVE   Leukocytes, UA NEGATIVE NEGATIVE  discussed with Dr. Tenny Craw- will get CBG, CBC, CMET D5NS IV; pt given peanut butter and crackers with reg sprite given- tolerated with PO Mucinex DM and meal ordered Serum labs delayed due to difficulty with obtaining blood- several attempts before labs obtained CXR from [facility]: Findings- the heart and mediastinum are normal.  The lungs are clear.   There is no infiltrate, pleural effusion, or pneumothorax.  No osseous abnormality. Impressions- stable chest.  No acute disease. Urinalysis at [facility] showed 4+glucose 3+ketones and budding yeast Care turned over to [place]  CBC    Component Value Date/Time   WBC 8.3 11/04/2015 2035   RBC 4.37 11/04/2015 2035   HGB 13.0 11/04/2015 2035   HCT 38.0 11/04/2015 2035   PLT 237 11/04/2015 2035   MCV 87.0 11/04/2015 2035   MCH 29.7 11/04/2015 2035   MCHC 34.2 11/04/2015 2035   RDW 13.6 11/04/2015 2035   LYMPHSABS 5.4* 08/31/2013 0500   MONOABS 0.6 08/31/2013 0500   EOSABS 0.9* 08/31/2013 0500   BASOSABS 0.1 08/31/2013 0500   CMP     Component Value Date/Time   NA 137 11/04/2015 2035   K 3.8 11/04/2015 2035   CL 112* 11/04/2015 2035   CO2 20* 11/04/2015 2035   GLUCOSE 133* 11/04/2015 2035   BUN 7 11/04/2015 2035   CREATININE 0.41* 11/04/2015 2035   CREATININE 0.62 03/22/2015 1210   CALCIUM 7.9* 11/04/2015 2035   PROT 6.0* 11/04/2015 2035   ALBUMIN 2.9* 11/04/2015 2035   AST 31 11/04/2015 2035   ALT 28 11/04/2015 2035   ALKPHOS 89 11/04/2015 2035   BILITOT 0.5 11/04/2015 2035   GFRNONAA >60 11/04/2015 2035   GFRNONAA >89 03/22/2015 1210   GFRAA >60 11/04/2015 2035    GFRAA >89 03/22/2015 1210   2150 Pt reassessed; able to keep down food and drink.  General - A&Ox3; Lungs CTA; RRR; pt verbalizes desire to go home.  500 cc of IV fluid infused.  Dr. Tenny Craw called and given update regarding labs and pt ability to tolerate fluids/PO > discharge, call regarding follow-up in office; RX for phenergan and Zofran  Assessment and Plan  19 y.o. G1P0 at [redacted]w[redacted]d IUP Influenza Diabetes - stable  Plan: Discharge to home RX Phenergan RX Zofran Follow-up in office this week  Marlis Edelson, CNM

## 2015-11-09 ENCOUNTER — Ambulatory Visit: Payer: BLUE CROSS/BLUE SHIELD | Admitting: Internal Medicine

## 2015-12-11 ENCOUNTER — Inpatient Hospital Stay (HOSPITAL_COMMUNITY)
Admission: AD | Admit: 2015-12-11 | Discharge: 2015-12-11 | Disposition: A | Discharge status: Home / Self Care | Payer: BLUE CROSS/BLUE SHIELD | Source: Ambulatory Visit | Attending: Obstetrics | Admitting: Obstetrics

## 2015-12-11 ENCOUNTER — Encounter (HOSPITAL_COMMUNITY): Payer: Self-pay | Admitting: *Deleted

## 2015-12-11 DIAGNOSIS — Z885 Allergy status to narcotic agent status: Secondary | ICD-10-CM | POA: Insufficient documentation

## 2015-12-11 DIAGNOSIS — O368120 Decreased fetal movements, second trimester, not applicable or unspecified: Secondary | ICD-10-CM | POA: Diagnosis not present

## 2015-12-11 DIAGNOSIS — Z888 Allergy status to other drugs, medicaments and biological substances status: Secondary | ICD-10-CM | POA: Insufficient documentation

## 2015-12-11 DIAGNOSIS — I10 Essential (primary) hypertension: Secondary | ICD-10-CM | POA: Insufficient documentation

## 2015-12-11 DIAGNOSIS — R197 Diarrhea, unspecified: Secondary | ICD-10-CM | POA: Diagnosis not present

## 2015-12-11 DIAGNOSIS — Z3A25 25 weeks gestation of pregnancy: Secondary | ICD-10-CM | POA: Insufficient documentation

## 2015-12-11 DIAGNOSIS — Z3689 Encounter for other specified antenatal screening: Secondary | ICD-10-CM

## 2015-12-11 DIAGNOSIS — E1043 Type 1 diabetes mellitus with diabetic autonomic (poly)neuropathy: Secondary | ICD-10-CM | POA: Diagnosis not present

## 2015-12-11 DIAGNOSIS — O368130 Decreased fetal movements, third trimester, not applicable or unspecified: Secondary | ICD-10-CM

## 2015-12-11 DIAGNOSIS — E10649 Type 1 diabetes mellitus with hypoglycemia without coma: Secondary | ICD-10-CM | POA: Insufficient documentation

## 2015-12-11 DIAGNOSIS — O368131 Decreased fetal movements, third trimester, fetus 1: Secondary | ICD-10-CM

## 2015-12-11 LAB — URINALYSIS, ROUTINE W REFLEX MICROSCOPIC
Bilirubin Urine: NEGATIVE
Glucose, UA: NEGATIVE mg/dL
Ketones, ur: NEGATIVE mg/dL
Leukocytes, UA: NEGATIVE
Nitrite: NEGATIVE
Protein, ur: NEGATIVE mg/dL
Specific Gravity, Urine: <1.005 — ABNORMAL LOW (ref 1.005–1.030)
pH: 6.5 (ref 5.0–8.0)

## 2015-12-11 LAB — URINE MICROSCOPIC-ADD ON: WBC, UA: NONE SEEN WBC/hpf (ref 0–5)

## 2015-12-11 NOTE — MAU Provider Note (Signed)
Chief Complaint:  Decreased Fetal Movement and Diarrhea   First Provider Initiated Contact with Patient 12/11/15 1803      HPI: Danielle Baker is a 20 y.o. G1P0 at [redacted]w[redacted]d who presents to maternity admissions reporting decreased fetal movement since yesterday with no movement felt today. She also reports an episode of abdominal cramping, described as constant low abdominal pain that woke her up last night and has lessened all day today and resolved this afternoon.  She reports diarrhea x 3 today but denies nausea/vomiting.  She reports she usually feels regular movement throughout the day but has not felt much movement since yesterday.  She has tried to drink fluids and change positions but has not felt any more movement. She called her office and was told to come to MAU. She denies LOF, vaginal bleeding, vaginal itching/burning, urinary symptoms, h/a, dizziness, n/v, or fever/chills.    HPI  Past Medical History: Past Medical History  Diagnosis Date  . Type 1 diabetes mellitus not at goal [facility])   . Hypoglycemia associated with diabetes (HCC)   . DM autonomic neuropathy (HCC)   . 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 Goiter   . Obesity, morbid (HCC)   . Dyspepsia   . Acanthosis nigricans, acquired   . Tachycardia   . Hypertension associated with diabetes (HCC)     Past obstetric history: OB History  Gravida Para Term Preterm AB SAB TAB Ectopic Multiple Living  1             # Outcome Date GA Lbr Len/2nd Weight Sex Delivery Anes PTL Lv  1 Current               Past Surgical History: Past Surgical History  Procedure Laterality Date  . Cholecystectomy      Family History: Family History  Problem Relation Age of Onset  . Thyroid disease Mother   . Obesity Mother   . Diabetes Father   . Obesity Father   . Obesity Sister   . Cancer Maternal Grandmother   . Obesity Paternal Grandfather     Social History: Social History  Substance Use Topics   . Smoking status: Never Smoker   . Smokeless tobacco: Never Used  . Alcohol Use: No    Allergies:  Allergies  Allergen Reactions  . Enalapril Other (See Comments)    "Weak and blacked out" couldn't stand up  . Metformin And Related Nausea And Vomiting  . Morphine And Related Rash    Meds:  No prescriptions prior to admission    ROS:  Review of Systems  Constitutional: Negative for fever, chills and fatigue.  Respiratory: Negative for shortness of breath.   Cardiovascular: Negative for chest pain.  Gastrointestinal: Positive for diarrhea. Negative for nausea and vomiting.  Genitourinary: Positive for pelvic pain. Negative for dysuria, flank pain, vaginal bleeding, vaginal discharge, difficulty urinating and vaginal pain.  Neurological: Negative for dizziness and headaches.  Psychiatric/Behavioral: Negative.      I have reviewed patient's Past Medical Hx, Surgical Hx, Family Hx, Social Hx, medications and allergies.   Physical Exam   Patient Vitals for the past 24 hrs:  BP Temp Temp src Pulse Resp SpO2 Weight  12/11/15 1839 123/62 mmHg - - 110 18 - -  12/11/15 1734 123/69 mmHg - - 111 - 98 % -  12/11/15 1650 138/70 mmHg 98.1 F (36.7 C) Oral 113 20 - 260 lb (117.935 kg)   Constitutional: Well-developed, well-nourished female  in no acute distress.  Cardiovascular: normal rate Respiratory: normal effort GI: Abd soft, non-tender, gravid appropriate for gestational age.  MS: Extremities nontender, no edema, normal ROM Neurologic: Alert and oriented x 4.  GU: Neg CVAT.  PELVIC EXAM: Deferred     FHT:  Baseline 145 , moderate variability, accelerations present, no decelerations Contractions: None on toco or to palpation   Labs: Results for orders placed or performed during the hospital encounter of 12/11/15 (from the past 24 hour(s))  Urinalysis, Routine w reflex microscopic (not at [facility])     Status: Abnormal   Collection Time: 12/11/15  4:55 PM  Result Value Ref  Range   Color, Urine YELLOW YELLOW   APPearance CLEAR CLEAR   Specific Gravity, Urine <1.005 (L) 1.005 - 1.030   pH 6.5 5.0 - 8.0   Glucose, UA NEGATIVE NEGATIVE mg/dL   Hgb urine dipstick TRACE (A) NEGATIVE   Bilirubin Urine NEGATIVE NEGATIVE   Ketones, ur NEGATIVE NEGATIVE mg/dL   Protein, ur NEGATIVE NEGATIVE mg/dL   Nitrite NEGATIVE NEGATIVE   Leukocytes, UA NEGATIVE NEGATIVE  Urine microscopic-add on     Status: Abnormal   Collection Time: 12/11/15  4:55 PM  Result Value Ref Range   Squamous Epithelial / LPF 0-5 (A) NONE SEEN   WBC, UA NONE SEEN 0 - 5 WBC/hpf   RBC / HPF 0-5 0 - 5 RBC/hpf   Bacteria, UA RARE (A) NONE SEEN      Imaging:  No results found.  MAU Course/MDM: I have ordered labs and reviewed results. Discussed reactive NST results with pt. Reassurance provided. Consult Dr Chestine Spore.  Pt to f/u in office as scheduled, return to MAU for emergencies or if symptoms worsen.  Pt stable at time of discharge.  Assessment: 1. Decreased fetal movement, third trimester, fetus 1   2. NST (non-stress test) reactive     Plan: Discharge home Labor precautions and fetal kick counts     Follow-up Information    Follow up with [facility] Chestine Spore, MD.   Specialty:  Obstetrics   Why:  As scheduled, Return to MAU as needed for emergencies   Contact information:   [address] [place] [address] [phone]        Medication List    STOP taking these medications        ondansetron 4 MG disintegrating tablet  Commonly known as:  ZOFRAN ODT     oseltamivir 75 MG capsule  Commonly known as:  TAMIFLU     promethazine 25 MG suppository  Commonly known as:  PHENERGAN      TAKE these medications        acetaminophen 500 MG tablet  Commonly known as:  TYLENOL  Take 1,000 mg by mouth every 6 (six) hours as needed for mild pain.     aspirin EC 81 MG tablet  Take 81 mg by mouth at bedtime.     calcium carbonate 500 MG chewable tablet  Commonly  known as:  TUMS - dosed in mg elemental calcium  Chew 2 tablets by mouth 4 (four) times daily as needed for indigestion or heartburn.     insulin aspart 100 UNIT/ML injection  Commonly known as:  NOVOLOG  Inject 100 Units into the skin daily.     insulin glargine 100 UNIT/ML injection  Commonly known as:  LANTUS  Inject 0.45 mLs (45 Units total) into the skin at bedtime.     prenatal multivitamin Tabs tablet  Take 1 tablet by mouth at bedtime.        Sharen Counter Certified Nurse-Midwife 12/12/2015 1:42 PM

## 2015-12-11 NOTE — MAU Note (Signed)
Decreased fetal movement last 2 days, almost no movement today. Was having pain last night, not too bad now. Stomach has been upset today, no vomiting, normal appetite, has had diarrhea 3 x today.

## 2015-12-11 NOTE — Discharge Instructions (Signed)
Fetal Movement Counts  Patient Name: __________________________________________________ Patient Due Date: ____________________  Performing a fetal movement count is highly recommended in high-risk pregnancies, but it is good for every pregnant woman to do. Your health care provider may ask you to start counting fetal movements at 28 weeks of the pregnancy. Fetal movements often increase:  · After eating a full meal.  · After physical activity.  · After eating or drinking something sweet or cold.  · At rest.  Pay attention to when you feel the baby is most active. This will help you notice a pattern of your baby's sleep and wake cycles and what factors contribute to an increase in fetal movement. It is important to perform a fetal movement count at the same time each day when your baby is normally most active.   HOW TO COUNT FETAL MOVEMENTS  1. Find a quiet and comfortable area to sit or lie down on your left side. Lying on your left side provides the best blood and oxygen circulation to your baby.  2. Write down the day and time on a sheet of paper or in a journal.  3. Start counting kicks, flutters, swishes, rolls, or jabs in a 2-hour period. You should feel at least 10 movements within 2 hours.  4. If you do not feel 10 movements in 2 hours, wait 2-3 hours and count again. Look for a change in the pattern or not enough counts in 2 hours.  SEEK MEDICAL CARE IF:  · You feel less than 10 counts in 2 hours, tried twice.  · There is no movement in over an hour.  · The pattern is changing or taking longer each day to reach 10 counts in 2 hours.  · You feel the baby is not moving as he or she usually does.  Date: ____________ Movements: ____________ Start time: ____________ Finish time: ____________   Date: ____________ Movements: ____________ Start time: ____________ Finish time: ____________  Date: ____________ Movements: ____________ Start time: ____________ Finish time: ____________  Date: ____________ Movements:  ____________ Start time: ____________ Finish time: ____________  Date: ____________ Movements: ____________ Start time: ____________ Finish time: ____________  Date: ____________ Movements: ____________ Start time: ____________ Finish time: ____________  Date: ____________ Movements: ____________ Start time: ____________ Finish time: ____________  Date: ____________ Movements: ____________ Start time: ____________ Finish time: ____________   Date: ____________ Movements: ____________ Start time: ____________ Finish time: ____________  Date: ____________ Movements: ____________ Start time: ____________ Finish time: ____________  Date: ____________ Movements: ____________ Start time: ____________ Finish time: ____________  Date: ____________ Movements: ____________ Start time: ____________ Finish time: ____________  Date: ____________ Movements: ____________ Start time: ____________ Finish time: ____________  Date: ____________ Movements: ____________ Start time: ____________ Finish time: ____________  Date: ____________ Movements: ____________ Start time: ____________ Finish time: ____________   Date: ____________ Movements: ____________ Start time: ____________ Finish time: ____________  Date: ____________ Movements: ____________ Start time: ____________ Finish time: ____________  Date: ____________ Movements: ____________ Start time: ____________ Finish time: ____________  Date: ____________ Movements: ____________ Start time: ____________ Finish time: ____________  Date: ____________ Movements: ____________ Start time: ____________ Finish time: ____________  Date: ____________ Movements: ____________ Start time: ____________ Finish time: ____________  Date: ____________ Movements: ____________ Start time: ____________ Finish time: ____________   Date: ____________ Movements: ____________ Start time: ____________ Finish time: ____________  Date: ____________ Movements: ____________ Start time: ____________ Finish  time: ____________  Date: ____________ Movements: ____________ Start time: ____________ Finish time: ____________  Date: ____________ Movements: ____________ Start time:   ____________ Finish time: ____________  Date: ____________ Movements: ____________ Start time: ____________ Finish time: ____________  Date: ____________ Movements: ____________ Start time: ____________ Finish time: ____________  Date: ____________ Movements: ____________ Start time: ____________ Finish time: ____________   Date: ____________ Movements: ____________ Start time: ____________ Finish time: ____________  Date: ____________ Movements: ____________ Start time: ____________ Finish time: ____________  Date: ____________ Movements: ____________ Start time: ____________ Finish time: ____________  Date: ____________ Movements: ____________ Start time: ____________ Finish time: ____________  Date: ____________ Movements: ____________ Start time: ____________ Finish time: ____________  Date: ____________ Movements: ____________ Start time: ____________ Finish time: ____________  Date: ____________ Movements: ____________ Start time: ____________ Finish time: ____________   Date: ____________ Movements: ____________ Start time: ____________ Finish time: ____________  Date: ____________ Movements: ____________ Start time: ____________ Finish time: ____________  Date: ____________ Movements: ____________ Start time: ____________ Finish time: ____________  Date: ____________ Movements: ____________ Start time: ____________ Finish time: ____________  Date: ____________ Movements: ____________ Start time: ____________ Finish time: ____________  Date: ____________ Movements: ____________ Start time: ____________ Finish time: ____________  Date: ____________ Movements: ____________ Start time: ____________ Finish time: ____________   Date: ____________ Movements: ____________ Start time: ____________ Finish time: ____________  Date: ____________  Movements: ____________ Start time: ____________ Finish time: ____________  Date: ____________ Movements: ____________ Start time: ____________ Finish time: ____________  Date: ____________ Movements: ____________ Start time: ____________ Finish time: ____________  Date: ____________ Movements: ____________ Start time: ____________ Finish time: ____________  Date: ____________ Movements: ____________ Start time: ____________ Finish time: ____________  Date: ____________ Movements: ____________ Start time: ____________ Finish time: ____________   Date: ____________ Movements: ____________ Start time: ____________ Finish time: ____________  Date: ____________ Movements: ____________ Start time: ____________ Finish time: ____________  Date: ____________ Movements: ____________ Start time: ____________ Finish time: ____________  Date: ____________ Movements: ____________ Start time: ____________ Finish time: ____________  Date: ____________ Movements: ____________ Start time: ____________ Finish time: ____________  Date: ____________ Movements: ____________ Start time: ____________ Finish time: ____________     This information is not intended to replace advice given to you by your health care provider. Make sure you discuss any questions you have with your health care provider.     Document Released: 09/03/2006 Document Revised: 08/25/2014 Document Reviewed: 05/31/2012  Elsevier Interactive Patient Education ©2016 [employer]

## 2016-01-07 ENCOUNTER — Inpatient Hospital Stay (HOSPITAL_COMMUNITY): Payer: BLUE CROSS/BLUE SHIELD

## 2016-01-07 ENCOUNTER — Encounter (HOSPITAL_COMMUNITY): Payer: Self-pay

## 2016-01-07 ENCOUNTER — Inpatient Hospital Stay (HOSPITAL_COMMUNITY)
Admission: AD | Admit: 2016-01-07 | Discharge: 2016-01-07 | Disposition: A | Discharge status: Home / Self Care | Payer: BLUE CROSS/BLUE SHIELD | Source: Ambulatory Visit | Attending: Obstetrics and Gynecology | Admitting: Obstetrics and Gynecology

## 2016-01-07 DIAGNOSIS — Z6841 Body Mass Index (BMI) 40.0 and over, adult: Secondary | ICD-10-CM | POA: Diagnosis not present

## 2016-01-07 DIAGNOSIS — Z833 Family history of diabetes mellitus: Secondary | ICD-10-CM | POA: Insufficient documentation

## 2016-01-07 DIAGNOSIS — Z888 Allergy status to other drugs, medicaments and biological substances status: Secondary | ICD-10-CM | POA: Insufficient documentation

## 2016-01-07 DIAGNOSIS — Z8349 Family history of other endocrine, nutritional and metabolic diseases: Secondary | ICD-10-CM | POA: Diagnosis not present

## 2016-01-07 DIAGNOSIS — Z3A29 29 weeks gestation of pregnancy: Secondary | ICD-10-CM | POA: Insufficient documentation

## 2016-01-07 DIAGNOSIS — O368130 Decreased fetal movements, third trimester, not applicable or unspecified: Secondary | ICD-10-CM | POA: Insufficient documentation

## 2016-01-07 DIAGNOSIS — O368190 Decreased fetal movements, unspecified trimester, not applicable or unspecified: Secondary | ICD-10-CM

## 2016-01-07 DIAGNOSIS — E1043 Type 1 diabetes mellitus with diabetic autonomic (poly)neuropathy: Secondary | ICD-10-CM | POA: Diagnosis not present

## 2016-01-07 DIAGNOSIS — Z885 Allergy status to narcotic agent status: Secondary | ICD-10-CM | POA: Insufficient documentation

## 2016-01-07 DIAGNOSIS — O24013 Pre-existing diabetes mellitus, type 1, in pregnancy, third trimester: Secondary | ICD-10-CM | POA: Insufficient documentation

## 2016-01-07 DIAGNOSIS — Z794 Long term (current) use of insulin: Secondary | ICD-10-CM | POA: Insufficient documentation

## 2016-01-07 DIAGNOSIS — O99213 Obesity complicating pregnancy, third trimester: Secondary | ICD-10-CM | POA: Insufficient documentation

## 2016-01-07 NOTE — MAU Provider Note (Signed)
First Provider Initiated Contact with Patient 01/07/16 1640      Chief Complaint:  Decreased Fetal Movement   MC GENTIL is  20 y.o. G1P0 at [redacted]w[redacted]d presents complaining of Decreased Fetal Movement .  She had not felt the baby move today until she was on the way to MAU, whereas she felt one kick.  She visualized movement during [place] that she did not feel.  However, now the baby is active.   Obstetrical/Gynecological History: OB History    Gravida Para Term Preterm AB TAB SAB Ectopic Multiple Living   1              Past Medical History: Past Medical History  Diagnosis Date  . Type 1 diabetes mellitus not at goal [facility])   . Hypoglycemia associated with diabetes (HCC)   . DM autonomic neuropathy (HCC)   . 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 Goiter   . Obesity, morbid (HCC)   . Dyspepsia   . Acanthosis nigricans, acquired   . Tachycardia   . Hypertension associated with diabetes [facility])     Past Surgical History: Past Surgical History  Procedure Laterality Date  . Cholecystectomy      Family History: Family History  Problem Relation Age of Onset  . Thyroid disease Mother   . Obesity Mother   . Diabetes Father   . Obesity Father   . Obesity Sister   . Cancer Maternal Grandmother   . Obesity Paternal Grandfather     Social History: Social History  Substance Use Topics  . Smoking status: Never Smoker   . Smokeless tobacco: Never Used  . Alcohol Use: No    Allergies:  Allergies  Allergen Reactions  . Enalapril Other (See Comments)    "Weak and blacked out" couldn't stand up  . Metformin And Related Nausea And Vomiting  . Morphine And Related Rash    Meds:  Prescriptions prior to admission  Medication Sig Dispense Refill Last Dose  . acetaminophen (TYLENOL) 500 MG tablet Take 1,000 mg by mouth every 6 (six) hours as needed for mild pain.   Past Month at Unknown time  . aspirin EC 81 MG tablet Take 81 mg by mouth at bedtime.     01/06/2016 at Unknown time  . calcium carbonate (TUMS - DOSED IN MG ELEMENTAL CALCIUM) 500 MG chewable tablet Chew 2 tablets by mouth 4 (four) times daily as needed for indigestion or heartburn.   Past Week at Unknown time  . insulin aspart (NOVOLOG) 100 UNIT/ML injection Inject 100 Units into the skin daily. (Patient taking differently: Inject 5-20 Units into the skin 4 (four) times daily. Patient normally uses a total of about 100 units daily.) 30 mL 2 01/07/2016 at 1430  . insulin glargine (LANTUS) 100 UNIT/ML injection Inject 0.45 mLs (45 Units total) into the skin at bedtime. 30 mL 11 01/06/2016 at Unknown time  . Prenatal Vit-Fe Fumarate-FA (PRENATAL MULTIVITAMIN) TABS tablet Take 1 tablet by mouth at bedtime.   01/06/2016 at Unknown time  . promethazine (PHENERGAN) 25 MG tablet Take 25 mg by mouth every 4 (four) hours as needed for nausea or vomiting.   0 Past Week at Unknown time    Review of Systems   Constitutional: Negative for fever and chills Eyes: Negative for visual disturbances Respiratory: Negative for shortness of breath, dyspnea Cardiovascular: Negative for chest pain or palpitations  Gastrointestinal: Negative for vomiting, diarrhea and constipation Genitourinary: Negative for dysuria and urgency  Musculoskeletal: Negative for back pain, joint pain, myalgias.  Normal ROM  Neurological: Negative for dizziness and headaches    Physical Exam  Blood pressure 119/66, pulse 109, temperature 98.2 F (36.8 C), temperature source Oral, resp. rate 18, height  (1.626 m), weight 117.482 kg (259 lb), SpO2 99 %. GENERAL: Well-developed, well-nourished female in no acute distress.  LUNGS: Clear to auscultation bilaterally.  HEART: Regular rate and rhythm. ABDOMEN: Soft, nontender, nondistended, gravid.  EXTREMITIES: Nontender, no edema, 2+ distal pulses. DTR's 2+ FHT:  Baseline rate 150 bpm   Variability moderate  Accelerations present   Decelerations one mild variable, c/w  dates Contractions: Every 0 mins   Labs: No results found for this or any previous visit (from the past 24 hour(s)). Imaging Studies:  No results found.  Assessment: DAANYA LANPHIER is  20 y.o. G1P0 at [redacted]w[redacted]d presents with decreased FM, resolved. Normal AFI.  Plan: DC home. Discussed w/Dr Thornton Papas 5/22/20175:53 PM

## 2016-01-07 NOTE — Discharge Instructions (Signed)
Fetal Movement Counts  Patient Name: __________________________________________________ Patient Due Date: ____________________  Performing a fetal movement count is highly recommended in high-risk pregnancies, but it is good for every pregnant woman to do. Your health care provider may ask you to start counting fetal movements at 28 weeks of the pregnancy. Fetal movements often increase:  · After eating a full meal.  · After physical activity.  · After eating or drinking something sweet or cold.  · At rest.  Pay attention to when you feel the baby is most active. This will help you notice a pattern of your baby's sleep and wake cycles and what factors contribute to an increase in fetal movement. It is important to perform a fetal movement count at the same time each day when your baby is normally most active.   HOW TO COUNT FETAL MOVEMENTS  1. Find a quiet and comfortable area to sit or lie down on your left side. Lying on your left side provides the best blood and oxygen circulation to your baby.  2. Write down the day and time on a sheet of paper or in a journal.  3. Start counting kicks, flutters, swishes, rolls, or jabs in a 2-hour period. You should feel at least 10 movements within 2 hours.  4. If you do not feel 10 movements in 2 hours, wait 2-3 hours and count again. Look for a change in the pattern or not enough counts in 2 hours.  SEEK MEDICAL CARE IF:  · You feel less than 10 counts in 2 hours, tried twice.  · There is no movement in over an hour.  · The pattern is changing or taking longer each day to reach 10 counts in 2 hours.  · You feel the baby is not moving as he or she usually does.  Date: ____________ Movements: ____________ Start time: ____________ Finish time: ____________   Date: ____________ Movements: ____________ Start time: ____________ Finish time: ____________  Date: ____________ Movements: ____________ Start time: ____________ Finish time: ____________  Date: ____________ Movements:  ____________ Start time: ____________ Finish time: ____________  Date: ____________ Movements: ____________ Start time: ____________ Finish time: ____________  Date: ____________ Movements: ____________ Start time: ____________ Finish time: ____________  Date: ____________ Movements: ____________ Start time: ____________ Finish time: ____________  Date: ____________ Movements: ____________ Start time: ____________ Finish time: ____________   Date: ____________ Movements: ____________ Start time: ____________ Finish time: ____________  Date: ____________ Movements: ____________ Start time: ____________ Finish time: ____________  Date: ____________ Movements: ____________ Start time: ____________ Finish time: ____________  Date: ____________ Movements: ____________ Start time: ____________ Finish time: ____________  Date: ____________ Movements: ____________ Start time: ____________ Finish time: ____________  Date: ____________ Movements: ____________ Start time: ____________ Finish time: ____________  Date: ____________ Movements: ____________ Start time: ____________ Finish time: ____________   Date: ____________ Movements: ____________ Start time: ____________ Finish time: ____________  Date: ____________ Movements: ____________ Start time: ____________ Finish time: ____________  Date: ____________ Movements: ____________ Start time: ____________ Finish time: ____________  Date: ____________ Movements: ____________ Start time: ____________ Finish time: ____________  Date: ____________ Movements: ____________ Start time: ____________ Finish time: ____________  Date: ____________ Movements: ____________ Start time: ____________ Finish time: ____________  Date: ____________ Movements: ____________ Start time: ____________ Finish time: ____________   Date: ____________ Movements: ____________ Start time: ____________ Finish time: ____________  Date: ____________ Movements: ____________ Start time: ____________ Finish  time: ____________  Date: ____________ Movements: ____________ Start time: ____________ Finish time: ____________  Date: ____________ Movements: ____________ Start time:   ____________ Finish time: ____________  Date: ____________ Movements: ____________ Start time: ____________ Finish time: ____________  Date: ____________ Movements: ____________ Start time: ____________ Finish time: ____________  Date: ____________ Movements: ____________ Start time: ____________ Finish time: ____________   Date: ____________ Movements: ____________ Start time: ____________ Finish time: ____________  Date: ____________ Movements: ____________ Start time: ____________ Finish time: ____________  Date: ____________ Movements: ____________ Start time: ____________ Finish time: ____________  Date: ____________ Movements: ____________ Start time: ____________ Finish time: ____________  Date: ____________ Movements: ____________ Start time: ____________ Finish time: ____________  Date: ____________ Movements: ____________ Start time: ____________ Finish time: ____________  Date: ____________ Movements: ____________ Start time: ____________ Finish time: ____________   Date: ____________ Movements: ____________ Start time: ____________ Finish time: ____________  Date: ____________ Movements: ____________ Start time: ____________ Finish time: ____________  Date: ____________ Movements: ____________ Start time: ____________ Finish time: ____________  Date: ____________ Movements: ____________ Start time: ____________ Finish time: ____________  Date: ____________ Movements: ____________ Start time: ____________ Finish time: ____________  Date: ____________ Movements: ____________ Start time: ____________ Finish time: ____________  Date: ____________ Movements: ____________ Start time: ____________ Finish time: ____________   Date: ____________ Movements: ____________ Start time: ____________ Finish time: ____________  Date: ____________  Movements: ____________ Start time: ____________ Finish time: ____________  Date: ____________ Movements: ____________ Start time: ____________ Finish time: ____________  Date: ____________ Movements: ____________ Start time: ____________ Finish time: ____________  Date: ____________ Movements: ____________ Start time: ____________ Finish time: ____________  Date: ____________ Movements: ____________ Start time: ____________ Finish time: ____________  Date: ____________ Movements: ____________ Start time: ____________ Finish time: ____________   Date: ____________ Movements: ____________ Start time: ____________ Finish time: ____________  Date: ____________ Movements: ____________ Start time: ____________ Finish time: ____________  Date: ____________ Movements: ____________ Start time: ____________ Finish time: ____________  Date: ____________ Movements: ____________ Start time: ____________ Finish time: ____________  Date: ____________ Movements: ____________ Start time: ____________ Finish time: ____________  Date: ____________ Movements: ____________ Start time: ____________ Finish time: ____________     This information is not intended to replace advice given to you by your health care provider. Make sure you discuss any questions you have with your health care provider.     Document Released: 09/03/2006 Document Revised: 08/25/2014 Document Reviewed: 05/31/2012  Elsevier Interactive Patient Education ©2016 [employer]

## 2016-01-07 NOTE — MAU Note (Signed)
PT c/o not feeling the baby move as much since Friday. Pt has felt the baby move once today. Pt denies contractions, bleeding and leaking fluid.

## 2016-02-07 ENCOUNTER — Inpatient Hospital Stay (HOSPITAL_COMMUNITY)
Admission: EM | Admit: 2016-02-07 | Discharge: 2016-02-07 | Disposition: A | Discharge status: Home / Self Care | Payer: BLUE CROSS/BLUE SHIELD | Source: Ambulatory Visit | Attending: Obstetrics and Gynecology | Admitting: Obstetrics and Gynecology

## 2016-02-07 ENCOUNTER — Encounter (HOSPITAL_COMMUNITY): Payer: Self-pay | Admitting: *Deleted

## 2016-02-07 DIAGNOSIS — Z794 Long term (current) use of insulin: Secondary | ICD-10-CM | POA: Diagnosis not present

## 2016-02-07 DIAGNOSIS — Z3A33 33 weeks gestation of pregnancy: Secondary | ICD-10-CM | POA: Insufficient documentation

## 2016-02-07 DIAGNOSIS — O99513 Diseases of the respiratory system complicating pregnancy, third trimester: Secondary | ICD-10-CM | POA: Insufficient documentation

## 2016-02-07 DIAGNOSIS — J45909 Unspecified asthma, uncomplicated: Secondary | ICD-10-CM | POA: Insufficient documentation

## 2016-02-07 DIAGNOSIS — O24013 Pre-existing diabetes mellitus, type 1, in pregnancy, third trimester: Secondary | ICD-10-CM | POA: Diagnosis not present

## 2016-02-07 DIAGNOSIS — O212 Late vomiting of pregnancy: Secondary | ICD-10-CM | POA: Diagnosis not present

## 2016-02-07 DIAGNOSIS — O99213 Obesity complicating pregnancy, third trimester: Secondary | ICD-10-CM | POA: Diagnosis not present

## 2016-02-07 DIAGNOSIS — E109 Type 1 diabetes mellitus without complications: Secondary | ICD-10-CM | POA: Insufficient documentation

## 2016-02-07 DIAGNOSIS — Z7982 Long term (current) use of aspirin: Secondary | ICD-10-CM | POA: Diagnosis not present

## 2016-02-07 DIAGNOSIS — O219 Vomiting of pregnancy, unspecified: Secondary | ICD-10-CM | POA: Diagnosis not present

## 2016-02-07 DIAGNOSIS — R112 Nausea with vomiting, unspecified: Secondary | ICD-10-CM | POA: Diagnosis present

## 2016-02-07 HISTORY — DX: Cardiac murmur, unspecified: R01.1

## 2016-02-07 HISTORY — DX: Acute pancreatitis without necrosis or infection, unspecified: K85.90

## 2016-02-07 LAB — URINALYSIS, ROUTINE W REFLEX MICROSCOPIC
Bilirubin Urine: NEGATIVE
Glucose, UA: 100 mg/dL — AB
Hgb urine dipstick: NEGATIVE
Ketones, ur: NEGATIVE mg/dL
Leukocytes, UA: NEGATIVE
Nitrite: NEGATIVE
Protein, ur: NEGATIVE mg/dL
Specific Gravity, Urine: 1.015 (ref 1.005–1.030)
pH: 7.5 (ref 5.0–8.0)

## 2016-02-07 LAB — COMPREHENSIVE METABOLIC PANEL
ALT: 14 U/L (ref 14–54)
AST: 15 U/L (ref 15–41)
Albumin: 3 g/dL — ABNORMAL LOW (ref 3.5–5.0)
Alkaline Phosphatase: 76 U/L (ref 38–126)
Anion gap: 9 (ref 5–15)
BUN: 11 mg/dL (ref 6–20)
CO2: 20 mmol/L — ABNORMAL LOW (ref 22–32)
Calcium: 8.6 mg/dL — ABNORMAL LOW (ref 8.9–10.3)
Chloride: 108 mmol/L (ref 101–111)
Creatinine, Ser: 0.49 mg/dL (ref 0.44–1.00)
GFR calc Af Amer: >60 mL/min (ref >60)
GFR calc non Af Amer: >60 mL/min (ref >60)
Glucose, Bld: 132 mg/dL — ABNORMAL HIGH (ref 65–99)
Potassium: 3.7 mmol/L (ref 3.5–5.1)
Sodium: 137 mmol/L (ref 135–145)
Total Bilirubin: 0.3 mg/dL (ref 0.3–1.2)
Total Protein: 6.4 g/dL — ABNORMAL LOW (ref 6.5–8.1)

## 2016-02-07 LAB — GLUCOSE, CAPILLARY: Glucose-Capillary: 148 mg/dL — ABNORMAL HIGH (ref 65–99)

## 2016-02-07 MED ORDER — ONDANSETRON 8 MG PO TBDP
8.0000 mg | ORAL_TABLET | Freq: Once | ORAL | Status: AC
  Administered 2016-02-07: 8 mg via ORAL
  Filled 2016-02-07: qty 1

## 2016-02-07 MED ORDER — ONDANSETRON 4 MG PO TBDP: 4.0000 mg | ORAL_TABLET | Freq: Three times a day (TID) | ORAL | Status: DC | PRN

## 2016-02-07 NOTE — MAU Provider Note (Signed)
History     CSN: [id number]  Arrival date and time: 02/07/16 1418   First Provider Initiated Contact with Patient 02/07/16 1525      Chief Complaint  Patient presents with  . Emesis During Pregnancy   HPI   Danielle Baker is a 20 y.o. female G1P0 @ [redacted]w[redacted]d with a history of type 1 DM  here with Nausea and vomiting X 2 days. She has not been able to keep anything down in the last 12 hours. No problems with N/V in the pregnancy. Had phenergan suppositories at home and took this morning which helped.   Was able to eat 6 wheat thins today and was able to keep those down. The last time she vomited was 10:30 this morning.   She denies pain at this time.  + fetal movement Denies vaginal bleeding.   OB History    Gravida Para Term Preterm AB TAB SAB Ectopic Multiple Living   1               Past Medical History  Diagnosis Date  . Type 1 diabetes mellitus not at goal [facility])   . Hypoglycemia associated with diabetes (HCC)   . DM autonomic neuropathy (HCC)   . 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 Goiter   . Obesity, morbid (HCC)   . Dyspepsia   . Acanthosis nigricans, acquired   . Tachycardia   . Asthma     seasonal  . Heart murmur     as a child  . Pancreatitis     Past Surgical History  Procedure Laterality Date  . Cholecystectomy      Family History  Problem Relation Age of Onset  . Thyroid disease Mother   . Obesity Mother   . Diabetes Father   . Obesity Father   . Obesity Sister   . Cancer Maternal Grandmother   . Obesity Paternal Grandfather     Social History  Substance Use Topics  . Smoking status: Never Smoker   . Smokeless tobacco: Never Used  . Alcohol Use: No    Allergies:  Allergies  Allergen Reactions  . Enalapril Other (See Comments)    "Weak and blacked out" couldn't stand up  . Metformin And Related Nausea And Vomiting  . Morphine And Related Rash    Prescriptions prior to admission  Medication Sig  Dispense Refill Last Dose  . acetaminophen (TYLENOL) 500 MG tablet Take 1,000 mg by mouth every 6 (six) hours as needed for mild pain.   02/06/2016 at Unknown time  . insulin aspart (NOVOLOG) 100 UNIT/ML injection Inject 100 Units into the skin daily. (Patient taking differently: Inject 5-25 Units into the skin 4 (four) times daily. Patient normally uses a total of about 100 units daily.) 30 mL 2 02/06/2016 at Unknown time  . insulin glargine (LANTUS) 100 UNIT/ML injection Inject 0.45 mLs (45 Units total) into the skin at bedtime. 30 mL 11 02/06/2016 at Unknown time  . promethazine (PHENERGAN) 25 MG tablet Take 25 mg by mouth every 4 (four) hours as needed for nausea or vomiting.   0 02/06/2016 at Unknown time  . aspirin EC 81 MG tablet Take 81 mg by mouth at bedtime.    02/05/2016  . calcium carbonate (TUMS - DOSED IN MG ELEMENTAL CALCIUM) 500 MG chewable tablet Chew 2 tablets by mouth 4 (four) times daily as needed for indigestion or heartburn.   01/17/2016  . Prenatal Vit-Fe Fumarate-FA (PRENATAL MULTIVITAMIN) TABS  tablet Take 1 tablet by mouth at bedtime.   02/05/2016   Results for orders placed or performed during the hospital encounter of 02/07/16 (from the past 48 hour(s))  Urinalysis, Routine w reflex microscopic (not at [facility])     Status: Abnormal   Collection Time: 02/07/16  2:30 PM  Result Value Ref Range   Color, Urine YELLOW YELLOW   APPearance CLOUDY (A) CLEAR   Specific Gravity, Urine 1.015 1.005 - 1.030   pH 7.5 5.0 - 8.0   Glucose, UA 100 (A) NEGATIVE mg/dL   Hgb urine dipstick NEGATIVE NEGATIVE   Bilirubin Urine NEGATIVE NEGATIVE   Ketones, ur NEGATIVE NEGATIVE mg/dL   Protein, ur NEGATIVE NEGATIVE mg/dL   Nitrite NEGATIVE NEGATIVE   Leukocytes, UA NEGATIVE NEGATIVE    Comment: MICROSCOPIC NOT DONE ON URINES WITH NEGATIVE PROTEIN, BLOOD, LEUKOCYTES, NITRITE, OR GLUCOSE <1000 mg/dL.  Glucose, capillary     Status: Abnormal   Collection Time: 02/07/16  3:32 PM  Result Value Ref  Range   Glucose-Capillary 148 (H) 65 - 99 mg/dL  Comprehensive metabolic panel     Status: Abnormal   Collection Time: 02/07/16  3:58 PM  Result Value Ref Range   Sodium 137 135 - 145 mmol/L   Potassium 3.7 3.5 - 5.1 mmol/L   Chloride 108 101 - 111 mmol/L   CO2 20 (L) 22 - 32 mmol/L   Glucose, Bld 132 (H) 65 - 99 mg/dL   BUN 11 6 - 20 mg/dL   Creatinine, Ser 8.51 0.44 - 1.00 mg/dL   Calcium 8.6 (L) 8.9 - 10.3 mg/dL   Total Protein 6.4 (L) 6.5 - 8.1 g/dL   Albumin 3.0 (L) 3.5 - 5.0 g/dL   AST 15 15 - 41 U/L   ALT 14 14 - 54 U/L   Alkaline Phosphatase 76 38 - 126 U/L   Total Bilirubin 0.3 0.3 - 1.2 mg/dL   GFR calc non Af Amer >60 >60 mL/min   GFR calc Af Amer >60 >60 mL/min    Comment: (NOTE) The eGFR has been calculated using the CKD EPI equation. This calculation has not been validated in all clinical situations. eGFR's persistently <60 mL/min signify possible Chronic Kidney Disease.    Anion gap 9 5 - 15    Review of Systems  Constitutional: Negative for fever and chills.  Gastrointestinal: Positive for heartburn, nausea and vomiting. Negative for abdominal pain.  Genitourinary: Negative for dysuria.  Neurological: Negative for dizziness.   Physical Exam   Blood pressure 118/73, pulse 94, temperature 98 F (36.7 C), temperature source Oral, resp. rate 18, weight 262 lb (118.842 kg), SpO2 98 %.  Physical Exam  Constitutional: She is oriented to person, place, and time. She appears well-developed and well-nourished. No distress.  HENT:  Head: Normocephalic.  Respiratory: Effort normal.  Musculoskeletal: Normal range of motion.  Neurological: She is alert and oriented to person, place, and time.  Skin: Skin is warm. She is not diaphoretic.  Psychiatric: Her behavior is normal.   Fetal Tracing: Baseline: 130 bpm  Variability: Variability  Accelerations: 15x15 Decelerations: done  Toco: Quiet   MAU Course  Procedures  None  MDM  Zofran 8 mg ODT  CBG:  148 Anion gap normal.  Patient tolerating PO fluids   Discussed patient with Dr. Tenny Craw    Assessment and Plan   A:  1. Nausea and vomiting in pregnancy     P:  Discharge home in stable condition Rx: Zofran  Follow  up with Dr. Harrington Challenger as scheduled  Return to MAU if symptoms worsen    Lezlie Lye, NP 02/07/2016 3:26 PM

## 2016-02-07 NOTE — Discharge Instructions (Signed)

## 2016-02-07 NOTE — MAU Note (Signed)
Vomiting for the past 2 days, became worse last night & continues today.  Denies diarrhea.  Unable to keep liquids down.  Took 2 phenergan orally last night & vomited, then used phenergan suppository @ midnight which helped for a while.  Started vomiting again this morning.  Has some abd pain usually right after vomiting.  Denies bleeding or LOF.

## 2016-02-18 ENCOUNTER — Other Ambulatory Visit: Payer: Self-pay | Admitting: Obstetrics and Gynecology

## 2016-02-18 LAB — OB RESULTS CONSOLE GBS: GBS: POSITIVE

## 2016-02-29 ENCOUNTER — Other Ambulatory Visit: Payer: Self-pay | Admitting: Obstetrics & Gynecology

## 2016-03-10 ENCOUNTER — Inpatient Hospital Stay (HOSPITAL_COMMUNITY)
Admission: AD | Admit: 2016-03-10 | Discharge: 2016-03-10 | Disposition: A | Discharge status: Home / Self Care | Payer: BLUE CROSS/BLUE SHIELD | Source: Ambulatory Visit | Attending: Obstetrics & Gynecology | Admitting: Obstetrics & Gynecology

## 2016-03-10 ENCOUNTER — Encounter (HOSPITAL_COMMUNITY): Payer: Self-pay | Admitting: *Deleted

## 2016-03-10 DIAGNOSIS — Z3A38 38 weeks gestation of pregnancy: Secondary | ICD-10-CM | POA: Diagnosis not present

## 2016-03-10 DIAGNOSIS — Z794 Long term (current) use of insulin: Secondary | ICD-10-CM | POA: Insufficient documentation

## 2016-03-10 DIAGNOSIS — Z888 Allergy status to other drugs, medicaments and biological substances status: Secondary | ICD-10-CM | POA: Diagnosis not present

## 2016-03-10 DIAGNOSIS — Z833 Family history of diabetes mellitus: Secondary | ICD-10-CM | POA: Insufficient documentation

## 2016-03-10 DIAGNOSIS — O133 Gestational [pregnancy-induced] hypertension without significant proteinuria, third trimester: Secondary | ICD-10-CM | POA: Diagnosis not present

## 2016-03-10 DIAGNOSIS — E1043 Type 1 diabetes mellitus with diabetic autonomic (poly)neuropathy: Secondary | ICD-10-CM | POA: Diagnosis not present

## 2016-03-10 DIAGNOSIS — R030 Elevated blood-pressure reading, without diagnosis of hypertension: Secondary | ICD-10-CM | POA: Diagnosis present

## 2016-03-10 DIAGNOSIS — O26893 Other specified pregnancy related conditions, third trimester: Secondary | ICD-10-CM | POA: Insufficient documentation

## 2016-03-10 DIAGNOSIS — Z8349 Family history of other endocrine, nutritional and metabolic diseases: Secondary | ICD-10-CM | POA: Insufficient documentation

## 2016-03-10 DIAGNOSIS — Z885 Allergy status to narcotic agent status: Secondary | ICD-10-CM | POA: Diagnosis not present

## 2016-03-10 DIAGNOSIS — O24013 Pre-existing diabetes mellitus, type 1, in pregnancy, third trimester: Secondary | ICD-10-CM | POA: Diagnosis not present

## 2016-03-10 DIAGNOSIS — R000 Tachycardia, unspecified: Secondary | ICD-10-CM | POA: Insufficient documentation

## 2016-03-10 LAB — COMPREHENSIVE METABOLIC PANEL
ALT: 20 U/L (ref 14–54)
AST: 22 U/L (ref 15–41)
Albumin: 2.8 g/dL — ABNORMAL LOW (ref 3.5–5.0)
Alkaline Phosphatase: 101 U/L (ref 38–126)
Anion gap: 7 (ref 5–15)
BUN: 10 mg/dL (ref 6–20)
CO2: 18 mmol/L — ABNORMAL LOW (ref 22–32)
Calcium: 8.4 mg/dL — ABNORMAL LOW (ref 8.9–10.3)
Chloride: 108 mmol/L (ref 101–111)
Creatinine, Ser: 0.6 mg/dL (ref 0.44–1.00)
GFR calc Af Amer: >60 mL/min (ref >60)
GFR calc non Af Amer: >60 mL/min (ref >60)
Glucose, Bld: 204 mg/dL — ABNORMAL HIGH (ref 65–99)
Potassium: 3.7 mmol/L (ref 3.5–5.1)
Sodium: 133 mmol/L — ABNORMAL LOW (ref 135–145)
Total Bilirubin: 0.4 mg/dL (ref 0.3–1.2)
Total Protein: 6.5 g/dL (ref 6.5–8.1)

## 2016-03-10 LAB — CBC
HCT: 37.3 % (ref 36.0–46.0)
Hemoglobin: 11.9 g/dL — ABNORMAL LOW (ref 12.0–15.0)
MCH: 25.8 pg — ABNORMAL LOW (ref 26.0–34.0)
MCHC: 31.9 g/dL (ref 30.0–36.0)
MCV: 80.9 fL (ref 78.0–100.0)
Platelets: 320 10*3/uL (ref 150–400)
RBC: 4.61 MIL/uL (ref 3.87–5.11)
RDW: 14.1 % (ref 11.5–15.5)
WBC: 12.3 10*3/uL — ABNORMAL HIGH (ref 4.0–10.5)

## 2016-03-10 LAB — URINALYSIS, ROUTINE W REFLEX MICROSCOPIC
Bilirubin Urine: NEGATIVE
Glucose, UA: 500 mg/dL — AB
Hgb urine dipstick: NEGATIVE
Ketones, ur: NEGATIVE mg/dL
Leukocytes, UA: NEGATIVE
Nitrite: NEGATIVE
Protein, ur: NEGATIVE mg/dL
Specific Gravity, Urine: 1.015 (ref 1.005–1.030)
pH: 7 (ref 5.0–8.0)

## 2016-03-10 LAB — PROTEIN / CREATININE RATIO, URINE
Creatinine, Urine: 90 mg/dL
Protein Creatinine Ratio: 0.16 mg/mg{Cre} — ABNORMAL HIGH (ref 0.00–0.15)
Total Protein, Urine: 14 mg/dL

## 2016-03-10 LAB — GLUCOSE, CAPILLARY: Glucose-Capillary: 76 mg/dL (ref 65–99)

## 2016-03-10 LAB — LACTATE DEHYDROGENASE: LDH: 126 U/L (ref 98–192)

## 2016-03-10 LAB — URIC ACID: Uric Acid, Serum: 3.5 mg/dL (ref 2.3–6.6)

## 2016-03-10 MED ORDER — LACTATED RINGERS IV BOLUS (SEPSIS)
1000.0000 mL | Freq: Once | INTRAVENOUS | Status: AC
  Administered 2016-03-10: 1000 mL via INTRAVENOUS

## 2016-03-10 MED ORDER — HYDRALAZINE HCL 20 MG/ML IJ SOLN
10.0000 mg | Freq: Once | INTRAMUSCULAR | Status: DC | PRN
Start: 2016-03-10 — End: 2016-03-10

## 2016-03-10 MED ORDER — ONDANSETRON HCL 4 MG/2ML IJ SOLN
4.0000 mg | Freq: Once | INTRAMUSCULAR | Status: AC
  Administered 2016-03-10: 4 mg via INTRAVENOUS
  Filled 2016-03-10: qty 2

## 2016-03-10 MED ORDER — LABETALOL HCL 5 MG/ML IV SOLN: 20.0000 mg | INTRAVENOUS | Status: DC | PRN

## 2016-03-10 NOTE — MAU Provider Note (Signed)
History     CSN: [id number]  Arrival date and time: 03/10/16 1601   First Provider Initiated Contact with Patient 03/10/16 1726      Chief Complaint  Patient presents with  . Hypertension   HPI  Danielle Baker is a 20 y.o. G1P0 at [redacted]w[redacted]d who presents sent from office for BP evaluation. Elevated BPs in office today, 140s/90s; pt also presented to office with headache. Pt states headache as resolved since then after taking tylenol. Denies vision changes, epigastric pain, CP, or SOB. Denies abdominal pain LOF, or vaginal bleeding. Positive fetal movement.   OB History    Gravida Para Term Preterm AB Living   1             SAB TAB Ectopic Multiple Live Births                  Past Medical History:  Diagnosis Date  . Asthma    seasonal  . DM autonomic neuropathy (HCC)   . Heart murmur    as a child  . Hypoglycemia associated with diabetes (HCC)   . Obesity, morbid (HCC)   . Pancreatitis   . 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 Tachycardia   . Type 1 diabetes mellitus not at goal [facility])     Past Surgical History:  Procedure Laterality Date  . CHOLECYSTECTOMY      Family History  Problem Relation Age of Onset  . Thyroid disease Mother   . Obesity Mother   . Diabetes Father   . Obesity Father   . Obesity Sister   . Cancer Maternal Grandmother   . Obesity Paternal Grandfather     Social History  Substance Use Topics  . Smoking status: Never Smoker  . Smokeless tobacco: Never Used  . Alcohol use No    Allergies:  Allergies  Allergen Reactions  . Enalapril Other (See Comments)    "Weak and blacked out" couldn't stand up  . Metformin And Related Nausea And Vomiting  . Morphine And Related Rash    Prescriptions Prior to Admission  Medication Sig Dispense Refill Last Dose  . acetaminophen (TYLENOL) 500 MG tablet Take 1,000 mg by mouth every 6 (six) hours as needed for mild pain.   03/10/2016 at 1000  . insulin aspart (NOVOLOG) 100  UNIT/ML injection Inject 100 Units into the skin daily. (Patient taking differently: Inject 5-25 Units into the skin 4 (four) times daily. Patient normally uses a total of about 100 units daily.) 30 mL 2 03/10/2016 at Unknown time  . insulin glargine (LANTUS) 100 UNIT/ML injection Inject 0.45 mLs (45 Units total) into the skin at bedtime. (Patient taking differently: Inject 20-25 Units into the skin 2 (two) times daily. Patient uses 20 units at bedtime and 25 units every morning.) 30 mL 11 03/10/2016 at Unknown time  . Prenatal Vit-Fe Fumarate-FA (PRENATAL MULTIVITAMIN) TABS tablet Take 1 tablet by mouth at bedtime.   03/09/2016 at Unknown time  . ranitidine (ZANTAC) 150 MG tablet Take 150 mg by mouth daily as needed for heartburn.   Past Week at Unknown time  . ondansetron (ZOFRAN ODT) 4 MG disintegrating tablet Take 1 tablet (4 mg total) by mouth every 8 (eight) hours as needed for nausea or vomiting. (Patient not taking: Reported on 03/10/2016) 20 tablet 0 Not Taking at Unknown time    Review of Systems  Constitutional: Negative for chills and fever.  Respiratory: Negative for shortness of breath.  Cardiovascular: Negative for chest pain.  Gastrointestinal: Negative.   Genitourinary: Negative.   Neurological: Positive for headaches (resolved PTA with tylenol). Negative for weakness.   Physical Exam   Blood pressure 120/67, pulse (!) 121, temperature 98 F (36.7 C), temperature source Oral, resp. rate 18, SpO2 98 %. Patient Vitals for the past 24 hrs:  BP Temp Temp src Pulse Resp SpO2  03/10/16 1828 - - - 103 - 98 %  03/10/16 1815 - - - - - 98 %  03/10/16 1814 106/58 98.2 F (36.8 C) Oral 98 18 99 %  03/10/16 1745 99/59 - - (!) 124 - 97 %  03/10/16 1700 119/81 - - (!) 129 18 -  03/10/16 1659 - - - 113 - 98 %  03/10/16 1645 120/67 - - (!) 121 18 98 %  03/10/16 1637 122/71 98 F (36.7 C) Oral 110 20 98 %  03/10/16 1634 - - - - - 98 %    Physical Exam  Nursing note and vitals  reviewed. Constitutional: She is oriented to person, place, and time. She appears well-developed and well-nourished. No distress.  HENT:  Head: Normocephalic and atraumatic.  Eyes: Conjunctivae are normal. Right eye exhibits no discharge. Left eye exhibits no discharge. No scleral icterus.  Neck: Normal range of motion.  Cardiovascular: Regular rhythm and normal heart sounds.  Tachycardia present.   No murmur heard. Respiratory: Effort normal and breath sounds normal. No respiratory distress. She has no wheezes.  GI: Soft. There is no tenderness.  Neurological: She is alert and oriented to person, place, and time. She has normal reflexes.  Skin: Skin is warm and dry. She is not diaphoretic.  Psychiatric: She has a normal mood and affect. Her behavior is normal. Judgment and thought content normal.   Fetal Tracing:  Baseline: 140 Variability: moderate Accelerations: 15x15 Decelerations: none  Toco: none   MAU Course  Procedures Results for orders placed or performed during the hospital encounter of 03/10/16 (from the past 24 hour(s))  Protein / creatinine ratio, urine     Status: Abnormal   Collection Time: 03/10/16  4:14 PM  Result Value Ref Range   Creatinine, Urine 90.00 mg/dL   Total Protein, Urine 14 mg/dL   Protein Creatinine Ratio 0.16 (H) 0.00 - 0.15 mg/mg[Cre]  Urinalysis, Routine w reflex microscopic (not at [facility])     Status: Abnormal   Collection Time: 03/10/16  4:15 PM  Result Value Ref Range   Color, Urine YELLOW YELLOW   APPearance CLEAR CLEAR   Specific Gravity, Urine 1.015 1.005 - 1.030   pH 7.0 5.0 - 8.0   Glucose, UA 500 (A) NEGATIVE mg/dL   Hgb urine dipstick NEGATIVE NEGATIVE   Bilirubin Urine NEGATIVE NEGATIVE   Ketones, ur NEGATIVE NEGATIVE mg/dL   Protein, ur NEGATIVE NEGATIVE mg/dL   Nitrite NEGATIVE NEGATIVE   Leukocytes, UA NEGATIVE NEGATIVE  Comprehensive metabolic panel     Status: Abnormal   Collection Time: 03/10/16  4:29 PM  Result Value  Ref Range   Sodium 133 (L) 135 - 145 mmol/L   Potassium 3.7 3.5 - 5.1 mmol/L   Chloride 108 101 - 111 mmol/L   CO2 18 (L) 22 - 32 mmol/L   Glucose, Bld 204 (H) 65 - 99 mg/dL   BUN 10 6 - 20 mg/dL   Creatinine, Ser 1.61 0.44 - 1.00 mg/dL   Calcium 8.4 (L) 8.9 - 10.3 mg/dL   Total Protein 6.5 6.5 - 8.1 g/dL   Albumin  2.8 (L) 3.5 - 5.0 g/dL   AST 22 15 - 41 U/L   ALT 20 14 - 54 U/L   Alkaline Phosphatase 101 38 - 126 U/L   Total Bilirubin 0.4 0.3 - 1.2 mg/dL   GFR calc non Af Amer >60 >60 mL/min   GFR calc Af Amer >60 >60 mL/min   Anion gap 7 5 - 15  CBC     Status: Abnormal   Collection Time: 03/10/16  4:29 PM  Result Value Ref Range   WBC 12.3 (H) 4.0 - 10.5 K/uL   RBC 4.61 3.87 - 5.11 MIL/uL   Hemoglobin 11.9 (L) 12.0 - 15.0 g/dL   HCT 95.1 88.4 - 16.6 %   MCV 80.9 78.0 - 100.0 fL   MCH 25.8 (L) 26.0 - 34.0 pg   MCHC 31.9 30.0 - 36.0 g/dL   RDW 06.3 01.6 - 01.0 %   Platelets 320 150 - 400 K/uL  Lactate dehydrogenase     Status: None   Collection Time: 03/10/16  4:29 PM  Result Value Ref Range   LDH 126 98 - 192 U/L  Uric acid     Status: None   Collection Time: 03/10/16  4:29 PM  Result Value Ref Range   Uric Acid, Serum 3.5 2.3 - 6.6 mg/dL  Glucose, capillary     Status: None   Collection Time: 03/10/16  6:01 PM  Result Value Ref Range   Glucose-Capillary 76 65 - 99 mg/dL    MDM Reactive fetal tracing No elevated BPs CBC, CMP, LDH, uric acid, urine PCR Pt complaining of feeling like she's "running a marathon" d/t heart racing; also reports feelings of nausea. Felt "weird" this morning, but symptoms significantly worsened since being in MAU. Reports hx of tachycardia earlier in pregnancy; denies cardiac issues. Denies chest pain or SOB.   CBG 76 IV fluid bolus & IV zofran 4 mg EKG sinus tachycardia. Anterior infarct likely d/t lead placement in pregnancy Pt reports marked improvement after fluids & zofran. HR has returned to pt's baseline.  S/w Dr. Mora Appl regarding  patient, including episode of symptomatic tachycardia. Ok to discharge home & f/u later this week as scheduled for NST.   Assessment and Plan  A: 1. Tachycardia     P: Discharge home PreE s/s discussed Increase water intake & stay out of the sun Discussed reasons to return to MAU Keep f/u with OB - has scheduled NST later this week  Judeth Horn 03/10/2016, 4:49 PM

## 2016-03-10 NOTE — Discharge Instructions (Signed)

## 2016-03-10 NOTE — MAU Note (Signed)
Sent over from Dr. Madalyn Rob office for HTN.  Patient will be a primary section for baby estimated weight of 10lbs.

## 2016-03-11 ENCOUNTER — Encounter (HOSPITAL_COMMUNITY): Payer: Self-pay | Admitting: *Deleted

## 2016-03-14 ENCOUNTER — Encounter (HOSPITAL_COMMUNITY)
Admission: RE | Admit: 2016-03-14 | Discharge: 2016-03-14 | Disposition: A | Discharge status: Home / Self Care | Payer: BLUE CROSS/BLUE SHIELD | Source: Ambulatory Visit | Attending: Obstetrics | Admitting: Obstetrics

## 2016-03-14 LAB — ABO/RH: ABO/RH(D): A POS

## 2016-03-14 LAB — CBC
HCT: 37.2 % (ref 36.0–46.0)
Hemoglobin: 12.1 g/dL (ref 12.0–15.0)
MCH: 26.1 pg (ref 26.0–34.0)
MCHC: 32.5 g/dL (ref 30.0–36.0)
MCV: 80.3 fL (ref 78.0–100.0)
Platelets: 311 10*3/uL (ref 150–400)
RBC: 4.63 MIL/uL (ref 3.87–5.11)
RDW: 14.1 % (ref 11.5–15.5)
WBC: 12.9 10*3/uL — ABNORMAL HIGH (ref 4.0–10.5)

## 2016-03-14 LAB — BASIC METABOLIC PANEL
Anion gap: 9 (ref 5–15)
BUN: 15 mg/dL (ref 6–20)
CO2: 20 mmol/L — ABNORMAL LOW (ref 22–32)
Calcium: 8.7 mg/dL — ABNORMAL LOW (ref 8.9–10.3)
Chloride: 105 mmol/L (ref 101–111)
Creatinine, Ser: 0.57 mg/dL (ref 0.44–1.00)
GFR calc Af Amer: >60 mL/min (ref >60)
GFR calc non Af Amer: >60 mL/min (ref >60)
Glucose, Bld: 147 mg/dL — ABNORMAL HIGH (ref 65–99)
Potassium: 4 mmol/L (ref 3.5–5.1)
Sodium: 134 mmol/L — ABNORMAL LOW (ref 135–145)

## 2016-03-14 NOTE — Patient Instructions (Signed)
19 Danielle Baker  03/14/2016   Your procedure is scheduled on:  03/17/2016  Enter through the Main Entrance of [facility] at 0800 AM.  Pick up the phone at the desk and dial [phone].   Call this number if you have problems the morning of surgery: [phone]   Remember:   Do not eat food:After Midnight.  Do not drink clear liquids: After Midnight.  Take these medicines the morning of surgery with A SIP OF WATER: no meds in the morning.  Take 10units lantus insulin tonight at bedtime   Do not wear jewelry, make-up or nail polish.  Do not wear lotions, powders, or perfumes. You may wear deodorant.  Do not shave 48 hours prior to surgery.  Do not bring valuables to the hospital.  [facility] is not   responsible for any belongings or valuables brought to the hospital.  Contacts, dentures or bridgework may not be worn into surgery.  Leave suitcase in the car. After surgery it may be brought to your room.  For patients admitted to the hospital, checkout time is 11:00 AM the day of              discharge.   Patients discharged the day of surgery will not be allowed to drive             home.  Name and phone number of your driver: na  Special Instructions:   N/A   Please read over the following fact sheets that you were given:   Surgical Site Infection Prevention

## 2016-03-15 LAB — RPR: RPR Ser Ql: NONREACTIVE

## 2016-03-16 MED ORDER — DEXTROSE 5 % IV SOLN
3.0000 g | INTRAVENOUS | Status: AC
  Administered 2016-03-17: 3 g via INTRAVENOUS
  Filled 2016-03-16: qty 3000

## 2016-03-17 ENCOUNTER — Inpatient Hospital Stay (HOSPITAL_COMMUNITY)
Admission: RE | Admit: 2016-03-17 | Discharge: 2016-03-20 | DRG: 765 | Disposition: A | Discharge status: Home / Self Care | Payer: BLUE CROSS/BLUE SHIELD | Source: Ambulatory Visit | Attending: Obstetrics | Admitting: Obstetrics

## 2016-03-17 ENCOUNTER — Inpatient Hospital Stay (HOSPITAL_COMMUNITY): Payer: BLUE CROSS/BLUE SHIELD | Admitting: Anesthesiology

## 2016-03-17 ENCOUNTER — Encounter (HOSPITAL_COMMUNITY)
Admission: RE | Disposition: A | Discharge status: Home / Self Care | Payer: Self-pay | Source: Ambulatory Visit | Attending: Obstetrics

## 2016-03-17 ENCOUNTER — Encounter (HOSPITAL_COMMUNITY): Payer: Self-pay | Admitting: *Deleted

## 2016-03-17 ENCOUNTER — Inpatient Hospital Stay (HOSPITAL_COMMUNITY): Admission: RE | Admit: 2016-03-17 | Payer: BLUE CROSS/BLUE SHIELD | Source: Ambulatory Visit

## 2016-03-17 DIAGNOSIS — O2402 Pre-existing diabetes mellitus, type 1, in childbirth: Secondary | ICD-10-CM | POA: Diagnosis present

## 2016-03-17 DIAGNOSIS — O9952 Diseases of the respiratory system complicating childbirth: Secondary | ICD-10-CM | POA: Diagnosis present

## 2016-03-17 DIAGNOSIS — O3663X0 Maternal care for excessive fetal growth, third trimester, not applicable or unspecified: Secondary | ICD-10-CM | POA: Diagnosis present

## 2016-03-17 DIAGNOSIS — O99214 Obesity complicating childbirth: Secondary | ICD-10-CM | POA: Diagnosis present

## 2016-03-17 DIAGNOSIS — E109 Type 1 diabetes mellitus without complications: Secondary | ICD-10-CM | POA: Diagnosis present

## 2016-03-17 DIAGNOSIS — O99824 Streptococcus B carrier state complicating childbirth: Secondary | ICD-10-CM | POA: Diagnosis present

## 2016-03-17 DIAGNOSIS — Z794 Long term (current) use of insulin: Secondary | ICD-10-CM | POA: Diagnosis not present

## 2016-03-17 DIAGNOSIS — O24913 Unspecified diabetes mellitus in pregnancy, third trimester: Secondary | ICD-10-CM | POA: Diagnosis present

## 2016-03-17 DIAGNOSIS — Z6841 Body Mass Index (BMI) 40.0 and over, adult: Secondary | ICD-10-CM | POA: Diagnosis not present

## 2016-03-17 DIAGNOSIS — K219 Gastro-esophageal reflux disease without esophagitis: Secondary | ICD-10-CM | POA: Diagnosis present

## 2016-03-17 DIAGNOSIS — Z3A39 39 weeks gestation of pregnancy: Secondary | ICD-10-CM | POA: Diagnosis not present

## 2016-03-17 DIAGNOSIS — J45909 Unspecified asthma, uncomplicated: Secondary | ICD-10-CM | POA: Diagnosis present

## 2016-03-17 DIAGNOSIS — O9962 Diseases of the digestive system complicating childbirth: Secondary | ICD-10-CM | POA: Diagnosis present

## 2016-03-17 LAB — GLUCOSE, CAPILLARY
Glucose-Capillary: 122 mg/dL — ABNORMAL HIGH (ref 65–99)
Glucose-Capillary: 108 mg/dL — ABNORMAL HIGH (ref 65–99)
Glucose-Capillary: 175 mg/dL — ABNORMAL HIGH (ref 65–99)
Glucose-Capillary: 154 mg/dL — ABNORMAL HIGH (ref 65–99)
Glucose-Capillary: 158 mg/dL — ABNORMAL HIGH (ref 65–99)
Glucose-Capillary: 150 mg/dL — ABNORMAL HIGH (ref 65–99)
Glucose-Capillary: 138 mg/dL — ABNORMAL HIGH (ref 65–99)

## 2016-03-17 LAB — PREPARE RBC (CROSSMATCH)

## 2016-03-17 SURGERY — Surgical Case
Anesthesia: Spinal | Site: Abdomen | Wound class: Clean Contaminated

## 2016-03-17 MED ORDER — WITCH HAZEL-GLYCERIN EX PADS: 1.0000 "application " | MEDICATED_PAD | CUTANEOUS | Status: DC | PRN

## 2016-03-17 MED ORDER — KETOROLAC TROMETHAMINE 30 MG/ML IJ SOLN
30.0000 mg | Freq: Once | INTRAMUSCULAR | Status: AC
  Administered 2016-03-17: 30 mg via INTRAMUSCULAR

## 2016-03-17 MED ORDER — IBUPROFEN 600 MG PO TABS
600.0000 mg | ORAL_TABLET | Freq: Four times a day (QID) | ORAL | Status: DC
  Administered 2016-03-17 – 2016-03-20 (×12): 600 mg via ORAL
  Filled 2016-03-17 (×12): qty 1

## 2016-03-17 MED ORDER — PHENYLEPHRINE HCL 10 MG/ML IJ SOLN
INTRAMUSCULAR | Status: DC | PRN
  Administered 2016-03-17: 80 ug via INTRAVENOUS
  Administered 2016-03-17: 120 ug via INTRAVENOUS

## 2016-03-17 MED ORDER — INSULIN ASPART 100 UNIT/ML ~~LOC~~ SOLN
0.0000 [IU] | Freq: Three times a day (TID) | SUBCUTANEOUS | Status: DC
  Administered 2016-03-17: 3 [IU] via SUBCUTANEOUS

## 2016-03-17 MED ORDER — FENTANYL CITRATE (PF) 100 MCG/2ML IJ SOLN
INTRAMUSCULAR | Status: AC
  Filled 2016-03-17: qty 2

## 2016-03-17 MED ORDER — LACTATED RINGERS IV SOLN
Freq: Once | INTRAVENOUS | Status: AC
  Administered 2016-03-17 (×2): via INTRAVENOUS

## 2016-03-17 MED ORDER — OXYTOCIN 10 UNIT/ML IJ SOLN
INTRAVENOUS | Status: DC | PRN
  Administered 2016-03-17: 40 [IU] via INTRAVENOUS

## 2016-03-17 MED ORDER — PHENYLEPHRINE 40 MCG/ML (10ML) SYRINGE FOR IV PUSH (FOR BLOOD PRESSURE SUPPORT)
PREFILLED_SYRINGE | INTRAVENOUS | Status: AC
  Filled 2016-03-17: qty 10

## 2016-03-17 MED ORDER — FENTANYL CITRATE (PF) 100 MCG/2ML IJ SOLN: 25.0000 ug | INTRAMUSCULAR | Status: DC | PRN

## 2016-03-17 MED ORDER — INSULIN GLARGINE 100 UNIT/ML ~~LOC~~ SOLN
10.0000 [IU] | Freq: Every day | SUBCUTANEOUS | Status: AC
  Administered 2016-03-17: 10 [IU] via SUBCUTANEOUS
  Filled 2016-03-17: qty 0.1

## 2016-03-17 MED ORDER — PROMETHAZINE HCL 25 MG/ML IJ SOLN: 6.2500 mg | INTRAMUSCULAR | Status: DC | PRN

## 2016-03-17 MED ORDER — DIPHENHYDRAMINE HCL 25 MG PO CAPS
25.0000 mg | ORAL_CAPSULE | Freq: Four times a day (QID) | ORAL | Status: DC | PRN
  Administered 2016-03-18: 25 mg via ORAL

## 2016-03-17 MED ORDER — FENTANYL CITRATE (PF) 100 MCG/2ML IJ SOLN
INTRAMUSCULAR | Status: DC | PRN
  Administered 2016-03-17: 20 ug via INTRATHECAL

## 2016-03-17 MED ORDER — NALOXONE HCL 0.4 MG/ML IJ SOLN: 0.4000 mg | INTRAMUSCULAR | Status: DC | PRN

## 2016-03-17 MED ORDER — SIMETHICONE 80 MG PO CHEW
80.0000 mg | CHEWABLE_TABLET | ORAL | Status: DC
  Administered 2016-03-17 – 2016-03-20 (×3): 80 mg via ORAL
  Filled 2016-03-17 (×3): qty 1

## 2016-03-17 MED ORDER — DIPHENHYDRAMINE HCL 12.5 MG/5ML PO ELIX
12.5000 mg | ORAL_SOLUTION | Freq: Four times a day (QID) | ORAL | Status: DC | PRN
  Filled 2016-03-17: qty 5

## 2016-03-17 MED ORDER — LACTATED RINGERS IV SOLN
INTRAVENOUS | Status: DC
  Administered 2016-03-17: 10:00:00 via INTRAVENOUS

## 2016-03-17 MED ORDER — SENNOSIDES-DOCUSATE SODIUM 8.6-50 MG PO TABS
2.0000 | ORAL_TABLET | ORAL | Status: DC
  Administered 2016-03-17 – 2016-03-20 (×3): 2 via ORAL
  Filled 2016-03-17 (×3): qty 2

## 2016-03-17 MED ORDER — PRENATAL MULTIVITAMIN CH
1.0000 | ORAL_TABLET | Freq: Every day | ORAL | Status: DC
  Administered 2016-03-18 – 2016-03-20 (×3): 1 via ORAL
  Filled 2016-03-17 (×3): qty 1

## 2016-03-17 MED ORDER — SIMETHICONE 80 MG PO CHEW: 80.0000 mg | CHEWABLE_TABLET | ORAL | Status: DC | PRN

## 2016-03-17 MED ORDER — TETANUS-DIPHTH-ACELL PERTUSSIS 5-2.5-18.5 LF-MCG/0.5 IM SUSP: 0.5000 mL | Freq: Once | INTRAMUSCULAR | Status: DC

## 2016-03-17 MED ORDER — OXYTOCIN 10 UNIT/ML IJ SOLN
INTRAMUSCULAR | Status: AC
  Filled 2016-03-17: qty 4

## 2016-03-17 MED ORDER — SCOPOLAMINE 1 MG/3DAYS TD PT72
MEDICATED_PATCH | TRANSDERMAL | Status: AC
  Administered 2016-03-17: 1.5 mg via TRANSDERMAL
  Filled 2016-03-17: qty 1

## 2016-03-17 MED ORDER — LACTATED RINGERS IV SOLN
INTRAVENOUS | Status: DC
  Administered 2016-03-18 (×2): via INTRAVENOUS

## 2016-03-17 MED ORDER — COCONUT OIL OIL: 1.0000 "application " | TOPICAL_OIL | Status: DC | PRN

## 2016-03-17 MED ORDER — DIBUCAINE 1 % RE OINT: 1.0000 "application " | TOPICAL_OINTMENT | RECTAL | Status: DC | PRN

## 2016-03-17 MED ORDER — KETOROLAC TROMETHAMINE 30 MG/ML IJ SOLN
INTRAMUSCULAR | Status: AC
  Filled 2016-03-17: qty 1

## 2016-03-17 MED ORDER — SODIUM CHLORIDE 0.9% FLUSH: 9.0000 mL | INTRAVENOUS | Status: DC | PRN

## 2016-03-17 MED ORDER — ONDANSETRON HCL 4 MG/2ML IJ SOLN
INTRAMUSCULAR | Status: DC | PRN
  Administered 2016-03-17: 4 mg via INTRAVENOUS

## 2016-03-17 MED ORDER — PHENYLEPHRINE 8 MG IN D5W 100 ML (0.08MG/ML) PREMIX OPTIME
INJECTION | INTRAVENOUS | Status: AC
  Filled 2016-03-17: qty 100

## 2016-03-17 MED ORDER — SIMETHICONE 80 MG PO CHEW
80.0000 mg | CHEWABLE_TABLET | Freq: Three times a day (TID) | ORAL | Status: DC
  Administered 2016-03-17 – 2016-03-20 (×6): 80 mg via ORAL
  Filled 2016-03-17 (×7): qty 1

## 2016-03-17 MED ORDER — OXYTOCIN 40 UNITS IN LACTATED RINGERS INFUSION - SIMPLE MED: 2.5000 [IU]/h | INTRAVENOUS | Status: AC

## 2016-03-17 MED ORDER — PHENYLEPHRINE 8 MG IN D5W 100 ML (0.08MG/ML) PREMIX OPTIME
INJECTION | INTRAVENOUS | Status: DC | PRN
  Administered 2016-03-17: 40 ug/min via INTRAVENOUS

## 2016-03-17 MED ORDER — ONDANSETRON HCL 4 MG/2ML IJ SOLN
4.0000 mg | Freq: Four times a day (QID) | INTRAMUSCULAR | Status: DC | PRN
Start: 2016-03-17 — End: 2016-03-18

## 2016-03-17 MED ORDER — SCOPOLAMINE 1 MG/3DAYS TD PT72
1.0000 | MEDICATED_PATCH | Freq: Once | TRANSDERMAL | Status: DC
  Administered 2016-03-17: 1.5 mg via TRANSDERMAL

## 2016-03-17 MED ORDER — ONDANSETRON HCL 4 MG/2ML IJ SOLN
INTRAMUSCULAR | Status: AC
  Filled 2016-03-17: qty 2

## 2016-03-17 MED ORDER — MENTHOL 3 MG MT LOZG: 1.0000 | LOZENGE | OROMUCOSAL | Status: DC | PRN

## 2016-03-17 MED ORDER — MEPERIDINE HCL 25 MG/ML IJ SOLN: 6.2500 mg | INTRAMUSCULAR | Status: DC | PRN

## 2016-03-17 MED ORDER — DIPHENHYDRAMINE HCL 50 MG/ML IJ SOLN: 12.5000 mg | Freq: Four times a day (QID) | INTRAMUSCULAR | Status: DC | PRN

## 2016-03-17 MED ORDER — ACETAMINOPHEN 325 MG PO TABS
650.0000 mg | ORAL_TABLET | ORAL | Status: DC | PRN
  Administered 2016-03-20: 650 mg via ORAL
  Filled 2016-03-17: qty 2

## 2016-03-17 MED ORDER — HYDROMORPHONE 1 MG/ML IV SOLN
INTRAVENOUS | Status: DC
  Administered 2016-03-17: 15:00:00 via INTRAVENOUS
  Administered 2016-03-17: 4.1 mg via INTRAVENOUS
  Administered 2016-03-17: 6.8 mg via INTRAVENOUS
  Administered 2016-03-18: 0.9 mg via INTRAVENOUS
  Administered 2016-03-18: 3.6 mg via INTRAVENOUS
  Administered 2016-03-18: 0.6 mg via INTRAVENOUS
  Filled 2016-03-17: qty 25

## 2016-03-17 MED ORDER — BUPIVACAINE IN DEXTROSE 0.75-8.25 % IT SOLN
INTRATHECAL | Status: DC | PRN
  Administered 2016-03-17: 1.7 mL via INTRATHECAL

## 2016-03-17 SURGICAL SUPPLY — 39 items
APL SKNCLS STERI-STRIP NONHPOA (GAUZE/BANDAGES/DRESSINGS) ×1
BENZOIN TINCTURE PRP APPL 2/3 (GAUZE/BANDAGES/DRESSINGS) ×3 IMPLANT
CHLORAPREP W/TINT 26ML (MISCELLANEOUS) ×3 IMPLANT
CLAMP CORD UMBIL (MISCELLANEOUS) IMPLANT
CLOSURE WOUND 1/2 X4 (GAUZE/BANDAGES/DRESSINGS) ×1
CLOTH BEACON ORANGE TIMEOUT ST (SAFETY) ×3 IMPLANT
DRSG OPSITE POSTOP 4X10 (GAUZE/BANDAGES/DRESSINGS) ×3 IMPLANT
ELECT REM PT RETURN 9FT ADLT (ELECTROSURGICAL) ×3
ELECTRODE REM PT RTRN 9FT ADLT (ELECTROSURGICAL) ×1 IMPLANT
EXTRACTOR VACUUM KIWI (MISCELLANEOUS) IMPLANT
GLOVE BIO SURGEON STRL SZ 6 (GLOVE) ×3 IMPLANT
GLOVE BIOGEL PI IND STRL 6.5 (GLOVE) ×1 IMPLANT
GLOVE BIOGEL PI IND STRL 7.0 (GLOVE) ×1 IMPLANT
GLOVE BIOGEL PI INDICATOR 6.5 (GLOVE) ×2
GLOVE BIOGEL PI INDICATOR 7.0 (GLOVE) ×2
GOWN STRL REUS W/TWL LRG LVL3 (GOWN DISPOSABLE) ×6 IMPLANT
KIT ABG SYR 3ML LUER SLIP (SYRINGE) IMPLANT
NDL HYPO 25X5/8 SAFETYGLIDE (NEEDLE) IMPLANT
NEEDLE HYPO 25X5/8 SAFETYGLIDE (NEEDLE) IMPLANT
NS IRRIG 1000ML POUR BTL (IV SOLUTION) ×3 IMPLANT
PACK C SECTION WH (CUSTOM PROCEDURE TRAY) ×3 IMPLANT
PAD ABD 8X7 1/2 STERILE (GAUZE/BANDAGES/DRESSINGS) ×2 IMPLANT
PAD OB MATERNITY 4.3X12.25 (PERSONAL CARE ITEMS) ×3 IMPLANT
PENCIL SMOKE EVAC W/HOLSTER (ELECTROSURGICAL) ×3 IMPLANT
RTRCTR C-SECT PINK 25CM LRG (MISCELLANEOUS) ×3 IMPLANT
SPONGE GAUZE 4X4 16PLY NONSTR (GAUZE/BANDAGES/DRESSINGS) ×2 IMPLANT
STRIP CLOSURE SKIN 1/2X4 (GAUZE/BANDAGES/DRESSINGS) ×2 IMPLANT
SUT MNCRL 0 VIOLET CTX 36 (SUTURE) ×2 IMPLANT
SUT MNCRL AB 3-0 PS2 27 (SUTURE) ×3 IMPLANT
SUT MONOCRYL 0 CTX 36 (SUTURE) ×4
SUT PLAIN 0 NONE (SUTURE) IMPLANT
SUT PLAIN 2 0 (SUTURE) ×3
SUT PLAIN ABS 2-0 CT1 27XMFL (SUTURE) ×1 IMPLANT
SUT VIC AB 0 CTX 36 (SUTURE) ×6
SUT VIC AB 0 CTX36XBRD ANBCTRL (SUTURE) ×2 IMPLANT
SUT VIC AB 2-0 CT1 27 (SUTURE) ×3
SUT VIC AB 2-0 CT1 TAPERPNT 27 (SUTURE) ×1 IMPLANT
TOWEL OR 17X24 6PK STRL BLUE (TOWEL DISPOSABLE) ×3 IMPLANT
TRAY FOLEY CATH SILVER 14FR (SET/KITS/TRAYS/PACK) ×3 IMPLANT

## 2016-03-17 NOTE — Addendum Note (Signed)
Addendum  created 03/17/16 1552 by Shanon Payor, CRNA   Sign clinical note

## 2016-03-17 NOTE — Op Note (Signed)
Cesarean Section Procedure Note  Pre-operative Diagnosis: 1. Intrauterine pregnancy at [redacted]w[redacted]d  2. Type 1 diabetes mellitus 3. Suspected fetal macrosomia  Post-operative Diagnosis: same as above  Surgeon: Marlow Baars, MD  Procedure: Primary low transverse cesarean section   Anesthesia: Spinal anesthesia  Estimated Blood Loss: 700 mL         Drains: Foley catheter         Specimens: none         Implants: none         Complications:  None; patient tolerated the procedure well.         Disposition: PACU - hemodynamically stable.  Findings:  Normal uterus, tubes and ovaries bilaterally.  Viable female infant, 4270g (9lb 6oz) Apgars Per NNP, 1 minute apgar not assigned due to delayed cord clamping, but infant was vigorous and crying with good tone on the field.  5 minute apgar  9.    Procedure Details   After spinal anesthesia was found to adequate , the patient was placed in the dorsal supine position with a leftward tilt, draped and prepped in the usual sterile manner. A Pfannenstiel incision was made and carried down through the subcutaneous tissue to the fascia. The fascia was incised in the midline and the fascial incision was extended laterally with Mayo scissors. The superior aspect of the fascial incision was grasped with two Kocher clamp, tented up and the rectus muscles dissected off sharply. The rectus was then dissected off with blunt dissection and Mayo scissors inferiorly. The rectus muscles were separated in the midline. The abdominal peritoneum was identified, tented up, entered bluntly, and the incision was extended superiorly and inferiorly with good visualization of the bladder. The Alexis retractor was deployed. The vesicouterine peritoneum was identified, tented up, entered sharply, and the bladder flap was created digitally. Scalpel was then used to make a low transverse incision on the uterus which was extended in the cephalad-caudad direction with bandage scissors. The  fluid was clear. The fetal vertex was identified, elevated out of the pelvis and brought to the hysterotomy. The head was delivered easily followed by the shoulders and body. The cord was clamped and cut and the infant was passed to the waiting neonatologist. Placenta was then delivered spontaneously, intact and appear normal, the uterus was cleared of all clot and debris   The hysterotomy was repaired with #0 Monocryl in running locked fashion.  A second imbricating layer was placed with #0 Monocryl.  The mid-right portion of the hysterotomy had continued slow oozing, so a figure of eight stitch was placed with good hemostasis.   The serosal edges of the incision were oozy, and bovie cautery was used to achieve hemostasis.  The hysterotomy was reexamined and excellent hemostasis was noted.  The Alexis retractor was removed from the abdomen. The peritoneum was examined and all vessels noted to be hemostatic. The abdominal cavity was cleared of all clot and debris.  The peritoneum was closed with 2-0 vicryl in a running fashion. The fascia and rectus muscles were inspected and were hemostatic. The fascia was closed with 0 PDS in a running fashion. The subcuticular layer was irrigated and all bleeders cauterized.  The subcutaneous layer was re approximated with interrupted 3-0 plain gut.  The skin was closed with 3-0 monocryl in a subcuticular fashion. The incision was dressed with benzoine, steri strips and pressure dressing. All sponge lap and needle counts were correct x3. Patient tolerated the procedure well and recovered in stable condition following  the procedure.

## 2016-03-17 NOTE — Progress Notes (Signed)
Inpatient Diabetes Program Recommendations  AACE/ADA: New Consensus Statement on Inpatient Glycemic Control (2015)  Target Ranges:  Prepandial:   less than 140 mg/dL      Peak postprandial:   less than 180 mg/dL (1-2 hours)      Critically ill patients:  140 - 180 mg/dL   Results for FAIN, Tiaunna L (MRN [id number]) as of 03/17/2016 12:50  Ref. Range 03/17/2016 08:20 7/31/CHAUNA, OSORIA  Glucose-Capillary Latest Ref Range: 65 - 99 mg/dL 409 (H) 811 (H)   Review of Glycemic Control  Diabetes history: DM1 Outpatient Diabetes medications: Lantus 25 units QAM, Lantus 20 units QPM, Novolog 5-25 units TID with meals Current orders for Inpatient glycemic control: None yet; in PACU  Inpatient Diabetes Program Recommendations: Insulin - Basal: According to the chart, patient has Type 1 diabetes and therefore will require insulin. According to home medicaiton list, patient last took Lantus 10 units at 22:00 last night. Since patient has delivered, she will not require as much insulin. Recommend ordering Lantus 10 units QHS and continue to adjust accordingly. Correction (SSI): Please order CBGs and Novolog sensitive correction scale Q4H using the Glycemic Control order set.  Thanks, Orlando Penner, RN, MSN, CDE Diabetes Coordinator Inpatient Diabetes Program [phone] (Team Pager from 8am to 5pm) [phone] ([facility] office) [phone] [facility] office) [phone] [facility] office)

## 2016-03-17 NOTE — Anesthesia Postprocedure Evaluation (Signed)
Anesthesia Post Note  Patient: Danielle Baker  Procedure(s) Performed: Procedure(s) (LRB): CESAREAN SECTION (N/A)  Patient location during evaluation: PACU Anesthesia Type: Spinal Level of consciousness: oriented and awake and alert Pain management: pain level controlled Vital Signs Assessment: post-procedure vital signs reviewed and stable Respiratory status: spontaneous breathing, respiratory function stable and patient connected to nasal cannula oxygen Cardiovascular status: blood pressure returned to baseline and stable Postop Assessment: no headache, no backache, spinal receding, no signs of nausea or vomiting and patient able to bend at knees Anesthetic complications: no     Last Vitals:  Vitals:   03/17/16 1300 03/17/16 1311  BP: 112/73 110/61  Pulse: 91 82  Resp: (!) 25 20  Temp:  36.6 C    Last Pain:  Vitals:   03/17/16 1311  TempSrc: Oral  PainSc:    Pain Goal: Patients Stated Pain Goal: 3 (03/17/16 0830)               Cecile Hearing

## 2016-03-17 NOTE — Lactation Note (Signed)
This note was copied from a baby's chart. Lactation Consultation Note  Patient Name: Danielle Baker [id number] Date: 03/17/2016 Reason for consult: Initial assessment Baby at 7 hr of life. There are visitors present, RN in room, lab came in to draw blood from baby, and mom seems sleepy. Mom reported baby is latching well. FOB stated they are f/u with formula because of the baby's low blood sugar. Left handouts and instructed mom to call at next feeding for lactation.   Maternal Data    Feeding Feeding Type: Formula Length of feed: 10 min  LATCH Score/Interventions Latch: Grasps breast easily, tongue down, lips flanged, rhythmical sucking.  Audible Swallowing: A few with stimulation Intervention(s): Skin to skin  Type of Nipple: Everted at rest and after stimulation  Comfort (Breast/Nipple): Soft / non-tender     Hold (Positioning): Full assist, staff holds infant at breast  LATCH Score: 7  Lactation Tools Discussed/Used     Consult Status Consult Status: Follow-up Date: 03/17/16 Follow-up type: In-patient    Danielle Baker 03/17/2016, 5:20 PM

## 2016-03-17 NOTE — Anesthesia Postprocedure Evaluation (Signed)
Anesthesia Post Note  Patient: Danielle Baker  Procedure(s) Performed: Procedure(s) (LRB): CESAREAN SECTION (N/A)  Patient location during evaluation: Mother Baby Anesthesia Type: Spinal Level of consciousness: awake and alert and oriented Pain management: satisfactory to patient Vital Signs Assessment: post-procedure vital signs reviewed and stable Respiratory status: spontaneous breathing and nonlabored ventilation Cardiovascular status: stable Postop Assessment: no headache, no backache, patient able to bend at knees, no signs of nausea or vomiting and adequate PO intake Anesthetic complications: no     Last Vitals:  Vitals:   03/17/16 1437 03/17/16 1525  BP: 127/76 120/61  Pulse: 90 93  Resp: (!) 22 20  Temp: 36.8 C 36.6 C    Last Pain:  Vitals:   03/17/16 1525  TempSrc: Oral  PainSc:    Pain Goal: Patients Stated Pain Goal: 3 (03/17/16 0830)               Madison Hickman

## 2016-03-17 NOTE — H&P (Signed)
20 y.o. G1P0 @ [redacted]w[redacted]d presents for elective primary cesarean section for suspected fetal macrosomia.  Otherwise has good fetal movement and no bleeding.  Pregnancy c/b: 1. Type 1 DM:  EFW on 7/24: 4581g (>90%, AC>97%).  On split dose Lantus 20U qAM / 20 qPM, followed by [place] endocrinology.  Normal fetal ECHO.    Past Medical History:  Diagnosis Date  . Asthma    seasonal  . Heart murmur    as a child  . Hypoglycemia associated with diabetes (HCC)   . Obesity, morbid (HCC)   . Pancreatitis   . Tachycardia   . Type 1 diabetes mellitus not at goal [facility])    since age of 20 years old    Past Surgical History:  Procedure Laterality Date  . CHOLECYSTECTOMY      OB History  Gravida Para Term Preterm AB Living  1            SAB TAB Ectopic Multiple Live Births               # Outcome Date GA Lbr Len/2nd Weight Sex Delivery Anes PTL Lv  1 Current               Social History   Social History  . Marital status: Married    Spouse name: N/A  . Number of children: N/A  . Years of education: N/A   Occupational History  . Not on file.   Social History Main Topics  . Smoking status: Never Smoker  . Smokeless tobacco: Never Used  . Alcohol use No  . Drug use: No  . Sexual activity: Yes   Other Topics Concern  . Not on file   Social History Narrative   Newly married   [employer] - online   Works part time at a daycare   Works at a [employer] time      Enalapril; Metformin and related; and Morphine and related    Prenatal Transfer Tool  Maternal Diabetes: Yes:  Diabetes Type:  Pre-pregnancy Genetic Screening: Normal Maternal Ultrasounds/Referrals: Normal Fetal Ultrasounds or other Referrals:  Fetal echo Maternal Substance Abuse:  No Significant Maternal Medications:  Meds include: Other:  insulin Significant Maternal Lab Results: Lab values include: Group B Strep positive  ABO, Rh: --/--/A POS, A POS (07/28 1145) Antibody: NEG (07/28 1145) Rubella:  Immune RPR: Non Reactive (07/28 1145)  HBsAg: Negative (12/14 0000)  HIV: Non-reactive (12/14 0000)  GBS: Positive (07/03 0000)     Vitals:   03/17/16 0830  BP: (!) 136/91  Pulse: (!) 124  Resp: 18  Temp: 98.4 F (36.9 C)     General:  NAD Abdomen:  soft, gravid, obese Ex:  1+ edema FHTs:  present    A/P   20 y.o. G1P0 [redacted]w[redacted]d presents for elective primary cesarean section for suspected fetal macrosomia in the setting of type 1 DM.  Patient has been counseled on the option for elective primary c/s given EFW >4500gm.  She is aware of the potential for error in the EFW.  She elects to proceed.  Discussed risks of cesarean section to include, but not limited to, infection, bleeding, damage to surrounding strutcures (including bowel, bladder, tubes, ovaries, nerves, vessels, baby), need for additional procedures, risk of blood clot, need for transfusion. Consent signed,.  We reviewed the need for good glycemic control post op to minimize risk of post op infection.  Ancef 3gm on call to OR   [facility] GEFFEL  Brittian Renaldo

## 2016-03-17 NOTE — Progress Notes (Signed)
Called Dr. Chestine Spore to clarify blood sugars orders for every 1 hr.  Shared progress note from Diabetic Coordinator with Dr. Chestine Spore about starting pt. Back on Lantus 10 units at HS tonight, and meal coverage with sliding scale with Novolog, Bld. Sugars AC and HS.  Dr. Dwana Curd the order for 10 units of Lantus and to do blood sugars with meals and at HS and to cover meals with sliding scale insulin orders that she wrote in her orders.

## 2016-03-17 NOTE — Anesthesia Procedure Notes (Signed)
Spinal  Patient location during procedure: OR Staffing Anesthesiologist: Vonette Grosso EDWARD Performed: anesthesiologist  Preanesthetic Checklist Completed: patient identified, surgical consent, pre-op evaluation, timeout performed, IV checked, risks and benefits discussed and monitors and equipment checked Spinal Block Patient position: sitting Prep: site prepped and draped and DuraPrep Patient monitoring: continuous pulse ox and blood pressure Approach: midline Location: L3-4 Injection technique: single-shot Needle Needle type: Pencan  Needle gauge: 24 G Needle length: 9 cm Assessment Sensory level: T4 Additional Notes Functioning IV was confirmed and monitors were applied. Sterile prep and drape, including hand hygiene, mask and sterile gloves were used. The patient was positioned and the spine was prepped. The skin was anesthetized with lidocaine.  Free flow of clear CSF was obtained prior to injecting local anesthetic into the CSF.  The spinal needle aspirated freely following injection.  The needle was carefully withdrawn.  The patient tolerated the procedure well. Consent was obtained prior to procedure with all questions answered and concerns addressed. Risks including but not limited to bleeding, infection, nerve damage, paralysis, failed block, inadequate analgesia, allergic reaction, high spinal, itching and headache were discussed and the patient wished to proceed.   [name], MD     

## 2016-03-17 NOTE — Brief Op Note (Signed)
03/17/2016  11:35 AM  PATIENT:  Tawni Millers  20 y.o. female  PRE-OPERATIVE DIAGNOSIS:  TYPE I DM MACROSOMIA EDD:03/23/16 ALLERGIES: MORPHINE  POST-OPERATIVE DIAGNOSIS:  TYPE I DM MACROSOMIA EDD:03/23/16 ALLERGIES: MORPHINE  PROCEDURE:  Procedure(s) with comments: CESAREAN SECTION (N/A) - REQUEST RNFA  SURGEON:  Surgeon(s) and Role:    * Marlow Baars, MD - Primary  ANESTHESIA:   spinal  EBL:  Total I/O In: 2100 [I.V.:2100] Out: 900 [Urine:200; Blood:700]  BLOOD ADMINISTERED:none  DRAINS: none   LOCAL MEDICATIONS USED:  NONE  SPECIMEN:  No Specimen  DISPOSITION OF SPECIMEN:  N/A  COUNTS:  YES  TOURNIQUET:  * No tourniquets in log *  DICTATION: .Note written in EPIC  PLAN OF CARE: Admit to inpatient   PATIENT DISPOSITION:  PACU - hemodynamically stable.   Delay start of Pharmacological VTE agent (>24hrs) due to surgical blood loss or risk of bleeding: not applicable

## 2016-03-17 NOTE — Transfer of Care (Signed)
Immediate Anesthesia Transfer of Care Note  Patient: Danielle Baker  Procedure(s) Performed: Procedure(s) with comments: CESAREAN SECTION (N/A) - REQUEST RNFA  Patient Location: PACU  Anesthesia Type:Spinal  Level of Consciousness: awake  Airway & Oxygen Therapy: Patient Spontanous Breathing  Post-op Assessment: Report given to RN  Post vital signs: Reviewed and stable  Last Vitals:  Vitals:   03/17/16 0830  BP: (!) 136/91  Pulse: (!) 124  Resp: 18  Temp: 36.9 C    Last Pain:  Vitals:   03/17/16 0830  TempSrc: Oral      Patients Stated Pain Goal: 3 (03/17/16 0830)  Complications: No apparent anesthesia complications

## 2016-03-17 NOTE — Anesthesia Preprocedure Evaluation (Addendum)
Anesthesia Evaluation  Patient identified by MRN, date of birth, ID band Patient awake    Reviewed: Allergy & Precautions, NPO status , Patient's Chart, lab work & pertinent test results  Airway Mallampati: II  TM Distance: >3 FB Neck ROM: Full    Dental  (+) Teeth Intact, Dental Advisory Given   Pulmonary asthma ,    Pulmonary exam normal breath sounds clear to auscultation       Cardiovascular Exercise Tolerance: Good negative cardio ROS Normal cardiovascular exam Rhythm:Regular Rate:Normal     Neuro/Psych negative neurological ROS  negative psych ROS   GI/Hepatic Neg liver ROS, GERD  Medicated,  Endo/Other  diabetes (peripheral neuropathy), Type 1, Insulin DependentMorbid obesity  Renal/GU negative Renal ROS     Musculoskeletal negative musculoskeletal ROS (+)   Abdominal   Peds  Hematology negative hematology ROS (+) Plt 311k on 03/14/16   Anesthesia Other Findings Day of surgery medications reviewed with the patient.  Reproductive/Obstetrics (+) Pregnancy Macrosomia                            Anesthesia Physical Anesthesia Plan  ASA: III  Anesthesia Plan: Spinal   Post-op Pain Management:    Induction:   Airway Management Planned:   Additional Equipment:   Intra-op Plan:   Post-operative Plan:   Informed Consent: I have reviewed the patients History and Physical, chart, labs and discussed the procedure including the risks, benefits and alternatives for the proposed anesthesia with the patient or authorized representative who has indicated his/her understanding and acceptance.   Dental advisory given  Plan Discussed with: CRNA, Anesthesiologist and Surgeon  Anesthesia Plan Comments: (Discussed risks and benefits of and differences between spinal and general. Discussed risks of spinal including headache, backache, failure, bleeding, infection, and nerve damage. Patient  consents to spinal. Questions answered. Coagulation studies and platelet count acceptable.  **No intrathecal morphine due to patient allergy.**)       Anesthesia Quick Evaluation

## 2016-03-18 DIAGNOSIS — O24913 Unspecified diabetes mellitus in pregnancy, third trimester: Secondary | ICD-10-CM | POA: Diagnosis present

## 2016-03-18 LAB — GLUCOSE, CAPILLARY
Glucose-Capillary: 146 mg/dL — ABNORMAL HIGH (ref 65–99)
Glucose-Capillary: 105 mg/dL — ABNORMAL HIGH (ref 65–99)
Glucose-Capillary: 64 mg/dL — ABNORMAL LOW (ref 65–99)
Glucose-Capillary: 104 mg/dL — ABNORMAL HIGH (ref 65–99)
Glucose-Capillary: 57 mg/dL — ABNORMAL LOW (ref 65–99)
Glucose-Capillary: 137 mg/dL — ABNORMAL HIGH (ref 65–99)
Glucose-Capillary: 139 mg/dL — ABNORMAL HIGH (ref 65–99)

## 2016-03-18 LAB — TYPE AND SCREEN
ABO/RH(D): A POS
Antibody Screen: NEGATIVE
Unit division: 0
Unit division: 0

## 2016-03-18 LAB — CBC
HCT: 31.5 % — ABNORMAL LOW (ref 36.0–46.0)
Hemoglobin: 10.1 g/dL — ABNORMAL LOW (ref 12.0–15.0)
MCH: 25.6 pg — ABNORMAL LOW (ref 26.0–34.0)
MCHC: 32.1 g/dL (ref 30.0–36.0)
MCV: 79.9 fL (ref 78.0–100.0)
Platelets: 286 10*3/uL (ref 150–400)
RBC: 3.94 MIL/uL (ref 3.87–5.11)
RDW: 14.4 % (ref 11.5–15.5)
WBC: 14.3 10*3/uL — ABNORMAL HIGH (ref 4.0–10.5)

## 2016-03-18 MED ORDER — HYDROMORPHONE HCL 2 MG PO TABS
2.0000 mg | ORAL_TABLET | ORAL | Status: DC | PRN
  Administered 2016-03-18 – 2016-03-20 (×7): 2 mg via ORAL
  Filled 2016-03-18 (×8): qty 1

## 2016-03-18 MED ORDER — INSULIN GLARGINE 100 UNIT/ML ~~LOC~~ SOLN
10.0000 [IU] | Freq: Every day | SUBCUTANEOUS | Status: AC
  Administered 2016-03-18: 10 [IU] via SUBCUTANEOUS
  Filled 2016-03-18: qty 0.1

## 2016-03-18 MED ORDER — SUCROSE 24% NICU/PEDS ORAL SOLUTION
OROMUCOSAL | Status: AC
  Filled 2016-03-18: qty 0.5

## 2016-03-18 MED ORDER — INSULIN ASPART 100 UNIT/ML ~~LOC~~ SOLN
3.0000 [IU] | Freq: Three times a day (TID) | SUBCUTANEOUS | Status: DC
  Administered 2016-03-18 – 2016-03-20 (×5): 3 [IU] via SUBCUTANEOUS

## 2016-03-18 MED ORDER — INSULIN ASPART 100 UNIT/ML ~~LOC~~ SOLN
0.0000 [IU] | Freq: Three times a day (TID) | SUBCUTANEOUS | Status: DC
  Administered 2016-03-18: 3 [IU] via SUBCUTANEOUS
  Administered 2016-03-19: 1 [IU] via SUBCUTANEOUS
  Administered 2016-03-19: 2 [IU] via SUBCUTANEOUS
  Administered 2016-03-19: 1 [IU] via SUBCUTANEOUS
  Administered 2016-03-20: 3 [IU] via SUBCUTANEOUS

## 2016-03-18 NOTE — Progress Notes (Signed)
Subjective:  Postpartum Day 1: Cesarean Delivery Patient reports pain controlled with pca. Denies nausea or vomiting    Objective: Vital signs in last 24 hours: Temp:  [97.7 F (36.5 C)-98.7 F (37.1 C)] 98.2 F (36.8 C) (08/01 0512) Pulse Rate:  [81-116] 98 (08/01 0512) Resp:  [15-30] 20 (08/01 0559) BP: (86-135)/(41-76) 118/65 (08/01 0512) SpO2:  [96 %-100 %] 96 % (08/01 0559)  Physical Exam:  General: alert, cooperative and appears stated age Lochia: appropriate Uterine Fundus: firm Incision: healing well DVT Evaluation: No evidence of DVT seen on physical exam.   Recent Labs  03/18/16 0559  HGB 10.1*  HCT 31.5*    Assessment/Plan: Status post Cesarean section. Doing well postoperatively.  Continue current care. Insulin changed per DM coordinator recommendations  Harlowe Dowler H. 03/18/2016, 9:55 AM

## 2016-03-18 NOTE — Progress Notes (Signed)
Pt Blood sugar at meal was 64. 3 units of insulin given although patient hadn't eaten yet. Pt encouraged to eat. Rechecked blood sugar with result of 57. Pt given apple juice and gram cracker. Recheck Blood sugar with result of 104. Rechecked once again with result of 137. Pt states she did not feel low and felt like herself.  Will check blood sugar again tonight before given scheduled lantus.

## 2016-03-18 NOTE — Progress Notes (Addendum)
Inpatient Diabetes Program Recommendations  AACE/ADA: New Consensus Statement on Inpatient Glycemic Control (2015)  Target Ranges:  Prepandial:   less than 140 mg/dL      Peak postprandial:   less than 180 mg/dL (1-2 hours)      Critically ill patients:  140 - 180 mg/dL  Results for Danielle Baker, Danielle Baker (MRN [id number]) as of 03/18/2016 07:57  Ref. Range 03/17/2016 08:20 03/17/2016 11:13 03/17/2016 16:12 03/17/2016 17:22 03/17/2016 18:47 03/17/2016 19:41 03/17/2016 21:59  Glucose-Capillary Latest Ref Range: 65 - 99 mg/dL 308 (H) 657 (H) 846 (H) 154 (H) 158 (H) 150 (H) 138 (H)    Review of Glycemic Control  Diabetes history: DM1 Outpatient Diabetes medications: : Lantus 25 units QAM, Lantus 20 units QPM, Novolog 5-25 units TID with meals Current orders for Inpatient glycemic control: Novolog 0-16 units TID with meals  Inpatient Diabetes Program Recommendations:  Insulin - Basal: Patient received one time order of Lantus 10 units last night at bedtime.Fasting 146 mg/dl this morning. Please place order for Lantus 10 units QHS. Correction (SSI): Please discontinue current Novolog 0-16 units TID scale and use the regular Glycemic Control order set to order CBGs with Novolog sensitive scale (0-9 units) TID with meals and Novolog bedtime scale (0-5 units) QHS. Insulin-Meal Coverage: Patient will likely need meal coverage to cover carbohydrates. Recommend ordering Novolog 3 units TID with meals if patient eats at least 50% of meal (NURSING: please ensure patient eats before giving meal coverage).  Thanks, Orlando Penner, RN, MSN, CDE Diabetes Coordinator Inpatient Diabetes Program [phone] (Team Pager from 8am to 5pm) [phone] ([place] office) [phone] [facility] office) [phone] [facility] office)

## 2016-03-19 LAB — GLUCOSE, CAPILLARY
Glucose-Capillary: 115 mg/dL — ABNORMAL HIGH (ref 65–99)
Glucose-Capillary: 122 mg/dL — ABNORMAL HIGH (ref 65–99)
Glucose-Capillary: 227 mg/dL — ABNORMAL HIGH (ref 65–99)
Glucose-Capillary: 193 mg/dL — ABNORMAL HIGH (ref 65–99)
Glucose-Capillary: 131 mg/dL — ABNORMAL HIGH (ref 65–99)
Glucose-Capillary: 207 mg/dL — ABNORMAL HIGH (ref 65–99)

## 2016-03-19 MED ORDER — INSULIN GLARGINE 100 UNIT/ML ~~LOC~~ SOLN
10.0000 [IU] | Freq: Every day | SUBCUTANEOUS | Status: DC
  Administered 2016-03-19: 10 [IU] via SUBCUTANEOUS
  Filled 2016-03-19 (×2): qty 0.1

## 2016-03-19 NOTE — Lactation Note (Signed)
This note was copied from a baby's chart. Lactation Consultation Note  Patient Name: Danielle Baker [id number] Date: 03/19/2016 Reason for consult: Follow-up assessment Baby at 55 hr of life and mom has switched to formula. Discussed breast changes and pump to feed options when she gets home. She is aware of lactation services and support group. She will call if needed.   Maternal Data    Feeding Feeding Type: Bottle Fed - Formula Nipple Type: Slow - flow  LATCH Score/Interventions                      Lactation Tools Discussed/Used     Consult Status Consult Status: Complete    Rulon Eisenmenger 03/19/2016, 5:31 PM

## 2016-03-19 NOTE — Progress Notes (Addendum)
Inpatient Diabetes Program Recommendations  AACE/ADA: New Consensus Statement on Inpatient Glycemic Control (2015)  Target Ranges:  Prepandial:   less than 140 mg/dL      Peak postprandial:   less than 180 mg/dL (1-2 hours)      Critically ill patients:  140 - 180 mg/dL   Results for Danielle Baker, Danielle Baker (MRN [id number]) as of 03/19/2016 09:39  Ref. Range 03/18/2016 09:40 03/18/2016 15:00 03/18/2016 19:14 03/18/2016 19:45 03/18/2016 20:13 03/18/2016 20:39 03/18/2016 21:52 03/19/2016 07:01 03/19/2016 08:09  Glucose-Capillary Latest Ref Range: 65 - 99 mg/dL 644 (H) 034 (H) 64 (L) 57 (L) 104 (H) 137 (H) 139 (H) 115 (H) 122 (H)   Review of Glycemic Control  Current orders for Inpatient glycemic control: Novolog 0-9 units TID with meals, Novolog 3 units TID with meals for meal coverage  Inpatient Diabetes Program Recommendations:  Insulin - Basal: Noted Lantus 10 units QHS was discontinued and fasting glucose was 146 mg/dl on 8/1 and fasting glucose this morning was 122 mg/dl. Please keep in mind that patient has Type 1 diabetes so therefore she makes no insulin and will require basal, bolus, and correction insulin. Please reorder Lantus 10 units QHS. Insulin - Meal Coverage: Noted patient experienced hypoglycemia yesterday. Glucose was 64 mg/dl at 74:25 and patient was given Novolog 3 units at 19:17 for meal coverage and patient did not eat. Per meal coverage parameters, meal coverage should NOT be given if patient did not eat at least 50% of meal and/or if premeal glucose is less than 80 mg/dl. NURSING: please be sure patient eat at least 50% of meal and premeal glucose is at least 80 mg/dl before administering meal coverage insulin.  Thanks, Orlando Penner, RN, MSN, CDE Diabetes Coordinator Inpatient Diabetes Program [phone] (Team Pager from 8am to 5pm) [phone] ([place] office) [phone] [facility] office) [phone] [facility] office)

## 2016-03-19 NOTE — Plan of Care (Signed)
Problem: Nutritional: Goal: Mother's verbalization of comfort with breastfeeding process will improve Outcome: Not Progressing Mother has decided to just bottle feed not breast feed

## 2016-03-19 NOTE — Progress Notes (Signed)
POD#2 Pt without complaints. She has had some elevated Blood sugars. She has not had a shower yet.  VSSAF Imp/ Stable Plan/Continue to work with insulin dosing.

## 2016-03-20 LAB — GLUCOSE, CAPILLARY
Glucose-Capillary: 215 mg/dL — ABNORMAL HIGH (ref 65–99)
Glucose-Capillary: 61 mg/dL — ABNORMAL LOW (ref 65–99)
Glucose-Capillary: 79 mg/dL (ref 65–99)

## 2016-03-20 MED ORDER — HYDROCODONE-ACETAMINOPHEN 5-325 MG PO TABS: 1.0000 | ORAL_TABLET | ORAL | 0 refills | Status: DC | PRN

## 2016-03-20 MED ORDER — IBUPROFEN 800 MG PO TABS: 800.0000 mg | ORAL_TABLET | Freq: Three times a day (TID) | ORAL | 3 refills | Status: DC | PRN

## 2016-03-20 NOTE — Progress Notes (Signed)
Subjective: Postpartum Day 3: Cesarean Delivery Patient reports incisional pain, tolerating PO, + flatus and no problems voiding.    Objective: Vital signs in last 24 hours: Temp:  [97.9 F (36.6 C)-98.3 F (36.8 C)] 98.3 F (36.8 C) (08/03 0540) Pulse Rate:  [87-113] 87 (08/03 0540) Resp:  [20] 20 (08/03 0540) BP: (129-133)/(64-69) 133/64 (08/03 0540)  Physical Exam:  General: alert, cooperative, appears stated age and no distress Lochia: appropriate Uterine Fundus: firm Incision: healing well, no significant drainage, no dehiscence, no significant erythema DVT Evaluation: No evidence of DVT seen on physical exam. Negative Homan's sign. No cords or calf tenderness.   Recent Labs  03/18/16 0559  HGB 10.1*  HCT 31.5*    Assessment/Plan: Status post Cesarean section. Doing well postoperatively.  Discharge home with standard precautions and return to clinic in 4-6 weeks.  Essie Hart STACIA 03/20/2016, 8:22 AM

## 2016-03-20 NOTE — Discharge Summary (Signed)
Obstetric Discharge Summary Reason for Admission: cesarean section for suspected fetal macrosomia in Type I diabetic  Prenatal Procedures: NST and ultrasound Intrapartum Procedures: cesarean: low cervical, transverse Postpartum Procedures: none Complications-Operative and Postpartum: none Hemoglobin  Date Value Ref Range Status  03/18/2016 10.1 (L) 12.0 - 15.0 g/dL Final   HCT  Date Value Ref Range Status  03/18/2016 31.5 (L) 36.0 - 46.0 % Final    Physical Exam:  General: alert, cooperative, appears stated age and no distress Lochia: appropriate Uterine Fundus: firm Incision: healing well, no significant drainage, no dehiscence, no significant erythema DVT Evaluation: No evidence of DVT seen on physical exam. Negative Homan's sign. No cords or calf tenderness.   Discharge Diagnoses: Term Pregnancy-delivered  Discharge Information: Date: 03/20/2016 Activity: pelvic rest Diet: routine Medications: Ibuprofen and Percocet Condition: stable Instructions: refer to practice specific booklet Discharge to: home   Newborn Data: Live born female  Birth Weight: 9 lb 6.6 oz (4270 g) APGAR: , 9  Home with mother.  Danielle Baker Danielle Baker 03/20/2016, 8:28 AM

## 2016-03-20 NOTE — Progress Notes (Signed)
Hypoglycemic Event  CBG: 61 Treatment: 4 oz apple juice  Symptoms: None  Follow-up CBG: Time:1240 CBG Result:79  Possible Reasons for Event: Unknown  Comments/MD notified:    Laqueta Jean

## 2016-03-20 NOTE — Progress Notes (Signed)
Inpatient Diabetes Program Recommendations  AACE/ADA: New Consensus Statement on Inpatient Glycemic Control (2015)  Target Ranges:  Prepandial:   less than 140 mg/dL      Peak postprandial:   less than 180 mg/dL (1-2 hours)      Critically ill patients:  140 - 180 mg/dL   Results for Danielle Baker, Danielle Baker (MRN [id number]) as of 03/20/2016 08:34  Ref. Range 03/19/2016 07:01 03/19/2016 08:09 03/19/2016 12:17 03/19/2016 13:08 03/19/2016 18:38 03/19/2016 22:16 03/20/2016 07:52  Glucose-Capillary Latest Ref Range: 65 - 99 mg/dL 128 (H) 786 (H) 767 (H) 193 (H) 131 (H) 207 (H) 215 (H)   Review of Glycemic Control  Diabetes history: DM1 Current orders for Inpatient glycemic control: Lantus 10 units QHS, Novolog 0-9 units TID with meals, Novolog 3 units TID with meals  Inpatient Diabetes Program Recommendations: Insulin - Basal: Please consider increasing Lantus to 14 units QHS. Insulin - Meal Coverage: Please consider increasing meal coverage to Novolog 6 units TID with meals if patient eats 50% of meals.  Thanks, Orlando Penner, RN, MSN, CDE Diabetes Coordinator Inpatient Diabetes Program [phone] (Team Pager from 8am to 5pm) [phone] ([place] office) [phone] [facility] office) [phone] [facility] office)

## 2016-03-24 ENCOUNTER — Encounter (HOSPITAL_COMMUNITY): Payer: Self-pay | Admitting: *Deleted

## 2016-04-01 ENCOUNTER — Ambulatory Visit: Payer: BLUE CROSS/BLUE SHIELD | Admitting: Internal Medicine

## 2016-04-01 DIAGNOSIS — Z0289 Encounter for other administrative examinations: Secondary | ICD-10-CM

## 2016-05-14 ENCOUNTER — Other Ambulatory Visit: Payer: Self-pay | Admitting: Internal Medicine

## 2016-05-21 DIAGNOSIS — K859 Acute pancreatitis without necrosis or infection, unspecified: Secondary | ICD-10-CM | POA: Insufficient documentation

## 2016-05-21 DIAGNOSIS — R932 Abnormal findings on diagnostic imaging of liver and biliary tract: Secondary | ICD-10-CM | POA: Diagnosis not present

## 2016-05-21 DIAGNOSIS — E1310 Other specified diabetes mellitus with ketoacidosis without coma: Secondary | ICD-10-CM | POA: Diagnosis not present

## 2016-05-22 DIAGNOSIS — E1310 Other specified diabetes mellitus with ketoacidosis without coma: Secondary | ICD-10-CM | POA: Diagnosis not present

## 2016-05-22 DIAGNOSIS — K859 Acute pancreatitis without necrosis or infection, unspecified: Secondary | ICD-10-CM | POA: Diagnosis not present

## 2016-05-23 DIAGNOSIS — K859 Acute pancreatitis without necrosis or infection, unspecified: Secondary | ICD-10-CM | POA: Diagnosis not present

## 2016-05-23 DIAGNOSIS — E1310 Other specified diabetes mellitus with ketoacidosis without coma: Secondary | ICD-10-CM | POA: Diagnosis not present

## 2016-05-24 DIAGNOSIS — E1310 Other specified diabetes mellitus with ketoacidosis without coma: Secondary | ICD-10-CM | POA: Diagnosis not present

## 2016-05-24 DIAGNOSIS — R932 Abnormal findings on diagnostic imaging of liver and biliary tract: Secondary | ICD-10-CM | POA: Diagnosis not present

## 2016-05-24 DIAGNOSIS — K859 Acute pancreatitis without necrosis or infection, unspecified: Secondary | ICD-10-CM | POA: Diagnosis not present

## 2016-09-03 ENCOUNTER — Inpatient Hospital Stay: Admit: 2016-09-03 | Discharge: 2016-09-04

## 2016-09-03 DIAGNOSIS — E119 Type 2 diabetes mellitus without complications: Principal | ICD-10-CM

## 2016-09-03 DIAGNOSIS — Z3490 Encounter for supervision of normal pregnancy, unspecified, unspecified trimester: Secondary | ICD-10-CM

## 2016-09-03 DIAGNOSIS — Z5321 Procedure and treatment not carried out due to patient leaving prior to being seen by health care provider: Secondary | ICD-10-CM

## 2016-09-03 DIAGNOSIS — R109 Unspecified abdominal pain: Principal | ICD-10-CM

## 2016-09-03 MED ORDER — INSULIN ASPART 100 UNIT/ML SC SOLN
Freq: Three times a day (TID) | SUBCUTANEOUS
Start: 2016-09-03 — End: ?

## 2016-09-03 MED ORDER — INSULIN GLARGINE 100 UNIT/ML SC SOLN
Freq: Every evening | SUBCUTANEOUS
Start: 2016-09-03 — End: ?

## 2016-09-03 NOTE — ED Triage Notes
Pt ambulated into triage with steady gait and c/o RLQ abdominal pain radiating to right flank. Pt describes pain as stabbing 6/10.  Pt c/o diarrhea, fevers, chills, and loss of appetite. Pt denies dysuria. LMP 05/28/16, pt states IUD in place. Last bowel movement 40 minutes prior to arrival, describes as diarrhea.

## 2016-10-07 ENCOUNTER — Other Ambulatory Visit: Payer: Self-pay | Admitting: Internal Medicine

## 2016-10-08 ENCOUNTER — Other Ambulatory Visit: Payer: Self-pay

## 2016-10-08 MED ORDER — "INSULIN SYRINGE-NEEDLE U-100 31G X 5/16"" 1 ML MISC": 5 refills | Status: DC

## 2016-12-09 ENCOUNTER — Other Ambulatory Visit: Payer: Self-pay | Admitting: Internal Medicine

## 2016-12-10 ENCOUNTER — Encounter: Payer: Self-pay | Admitting: Internal Medicine

## 2016-12-10 NOTE — Telephone Encounter (Signed)
Last seen 09/30/15, no showed past two appointments. Okay to refill?

## 2016-12-10 NOTE — Telephone Encounter (Signed)
Submitted

## 2016-12-10 NOTE — Telephone Encounter (Signed)
No, I will need to discharge her 2/2 no shows.

## 2016-12-10 NOTE — Telephone Encounter (Signed)
Actually, Danielle Baker, since I am discharging her >> please refill a 3 mo supply for her for now.

## 2016-12-12 ENCOUNTER — Telehealth: Payer: Self-pay | Admitting: Internal Medicine

## 2016-12-12 NOTE — Telephone Encounter (Signed)
Patient dismissed from [facility] by Carlus Pavlov MD , effective December 10, 2016. Dismissal letter sent out by certified / registered mail.  daj

## 2016-12-17 NOTE — Telephone Encounter (Signed)
Certified dismissal letter returned as undeliverable, unclaimed, return to sender after one attempt by [employer] due to insufficient address. Resend the certified dismissal letter to new address provided by the [employer]. Copy of the returned envelope with the new address label send to [facility].  daj

## 2016-12-25 NOTE — Telephone Encounter (Signed)
Received signed domestic return receipt verifying delivery of certified letter on May 5,2018. Article number [id number] [phone] daj

## 2017-09-13 DIAGNOSIS — E1010 Type 1 diabetes mellitus with ketoacidosis without coma: Secondary | ICD-10-CM | POA: Diagnosis not present

## 2017-09-14 DIAGNOSIS — K859 Acute pancreatitis without necrosis or infection, unspecified: Secondary | ICD-10-CM | POA: Diagnosis not present

## 2017-09-14 DIAGNOSIS — E1010 Type 1 diabetes mellitus with ketoacidosis without coma: Secondary | ICD-10-CM | POA: Diagnosis not present

## 2017-09-15 DIAGNOSIS — E1010 Type 1 diabetes mellitus with ketoacidosis without coma: Secondary | ICD-10-CM | POA: Diagnosis not present

## 2017-09-15 DIAGNOSIS — K859 Acute pancreatitis without necrosis or infection, unspecified: Secondary | ICD-10-CM | POA: Diagnosis not present

## 2017-09-16 DIAGNOSIS — K859 Acute pancreatitis without necrosis or infection, unspecified: Secondary | ICD-10-CM | POA: Diagnosis not present

## 2017-09-16 DIAGNOSIS — E1010 Type 1 diabetes mellitus with ketoacidosis without coma: Secondary | ICD-10-CM | POA: Diagnosis not present

## 2017-09-17 DIAGNOSIS — E1010 Type 1 diabetes mellitus with ketoacidosis without coma: Secondary | ICD-10-CM | POA: Diagnosis not present

## 2017-09-17 DIAGNOSIS — K859 Acute pancreatitis without necrosis or infection, unspecified: Secondary | ICD-10-CM | POA: Diagnosis not present

## 2018-01-04 DIAGNOSIS — Z2839 Other underimmunization status: Secondary | ICD-10-CM | POA: Insufficient documentation

## 2018-01-04 DIAGNOSIS — O09899 Supervision of other high risk pregnancies, unspecified trimester: Secondary | ICD-10-CM | POA: Insufficient documentation

## 2018-01-14 DIAGNOSIS — Z3490 Encounter for supervision of normal pregnancy, unspecified, unspecified trimester: Secondary | ICD-10-CM | POA: Insufficient documentation

## 2018-01-29 ENCOUNTER — Encounter (HOSPITAL_COMMUNITY): Payer: Self-pay | Admitting: *Deleted

## 2018-01-29 ENCOUNTER — Inpatient Hospital Stay (HOSPITAL_COMMUNITY): Payer: BC Managed Care – PPO

## 2018-01-29 ENCOUNTER — Inpatient Hospital Stay (HOSPITAL_COMMUNITY)
Admission: AD | Admit: 2018-01-29 | Discharge: 2018-01-29 | Disposition: A | Discharge status: Home / Self Care | Payer: BC Managed Care – PPO | Source: Ambulatory Visit | Attending: Obstetrics | Admitting: Obstetrics

## 2018-01-29 DIAGNOSIS — Z885 Allergy status to narcotic agent status: Secondary | ICD-10-CM | POA: Insufficient documentation

## 2018-01-29 DIAGNOSIS — Z3A10 10 weeks gestation of pregnancy: Secondary | ICD-10-CM | POA: Diagnosis not present

## 2018-01-29 DIAGNOSIS — O99511 Diseases of the respiratory system complicating pregnancy, first trimester: Secondary | ICD-10-CM | POA: Insufficient documentation

## 2018-01-29 DIAGNOSIS — O99211 Obesity complicating pregnancy, first trimester: Secondary | ICD-10-CM | POA: Insufficient documentation

## 2018-01-29 DIAGNOSIS — E109 Type 1 diabetes mellitus without complications: Secondary | ICD-10-CM | POA: Insufficient documentation

## 2018-01-29 DIAGNOSIS — J45909 Unspecified asthma, uncomplicated: Secondary | ICD-10-CM | POA: Insufficient documentation

## 2018-01-29 DIAGNOSIS — O26891 Other specified pregnancy related conditions, first trimester: Secondary | ICD-10-CM | POA: Diagnosis not present

## 2018-01-29 DIAGNOSIS — R102 Pelvic and perineal pain: Secondary | ICD-10-CM | POA: Diagnosis not present

## 2018-01-29 DIAGNOSIS — Z79899 Other long term (current) drug therapy: Secondary | ICD-10-CM | POA: Diagnosis not present

## 2018-01-29 DIAGNOSIS — Z794 Long term (current) use of insulin: Secondary | ICD-10-CM | POA: Diagnosis not present

## 2018-01-29 DIAGNOSIS — Z9049 Acquired absence of other specified parts of digestive tract: Secondary | ICD-10-CM | POA: Insufficient documentation

## 2018-01-29 DIAGNOSIS — O24011 Pre-existing diabetes mellitus, type 1, in pregnancy, first trimester: Secondary | ICD-10-CM | POA: Diagnosis not present

## 2018-01-29 DIAGNOSIS — Z3490 Encounter for supervision of normal pregnancy, unspecified, unspecified trimester: Secondary | ICD-10-CM

## 2018-01-29 LAB — URINALYSIS, ROUTINE W REFLEX MICROSCOPIC
Bilirubin Urine: NEGATIVE
Glucose, UA: >=500 mg/dL — AB
Hgb urine dipstick: NEGATIVE
Ketones, ur: NEGATIVE mg/dL
Leukocytes, UA: NEGATIVE
Nitrite: NEGATIVE
Protein, ur: NEGATIVE mg/dL
Specific Gravity, Urine: 1.018 (ref 1.005–1.030)
pH: 6 (ref 5.0–8.0)

## 2018-01-29 LAB — CBC
HCT: 42.7 % (ref 36.0–46.0)
Hemoglobin: 14.5 g/dL (ref 12.0–15.0)
MCH: 30 pg (ref 26.0–34.0)
MCHC: 34 g/dL (ref 30.0–36.0)
MCV: 88.4 fL (ref 78.0–100.0)
Platelets: 290 10*3/uL (ref 150–400)
RBC: 4.83 MIL/uL (ref 3.87–5.11)
RDW: 12.4 % (ref 11.5–15.5)
WBC: 11.4 10*3/uL — ABNORMAL HIGH (ref 4.0–10.5)

## 2018-01-29 LAB — WET PREP, GENITAL
Clue Cells Wet Prep HPF POC: NONE SEEN
Sperm: NONE SEEN
Trich, Wet Prep: NONE SEEN
Yeast Wet Prep HPF POC: NONE SEEN

## 2018-01-29 LAB — GLUCOSE, CAPILLARY: Glucose-Capillary: 138 mg/dL — ABNORMAL HIGH (ref 65–99)

## 2018-01-29 LAB — HCG, QUANTITATIVE, PREGNANCY: hCG, Beta Chain, Quant, S: 117290 m[IU]/mL — ABNORMAL HIGH (ref <5)

## 2018-01-29 LAB — POCT PREGNANCY, URINE: Preg Test, Ur: POSITIVE — AB

## 2018-01-29 MED ORDER — IBUPROFEN 800 MG PO TABS
800.0000 mg | ORAL_TABLET | Freq: Once | ORAL | Status: AC
  Administered 2018-01-29: 800 mg via ORAL
  Filled 2018-01-29: qty 1

## 2018-01-29 NOTE — MAU Note (Signed)
Having pain in lower abd, lower back, and sometimes down legs since Tues. Mentioned to doctor Weds and cked urine and ok. Pain continues. Also having pain in L ear and left side of face with numbness L side of face.

## 2018-01-29 NOTE — MAU Provider Note (Signed)
History     CSN: [id number]  Arrival date and time: 01/29/18 1845   First Provider Initiated Contact with Patient 01/29/18 2002      Chief Complaint  Patient presents with  . Pelvic Pain   Danielle Baker is a 22 y.o. G2P1001 at [redacted]w[redacted]d who presents today with pelvic pain that radiates to her back and down her legs.   Pelvic Pain  The patient's primary symptoms include pelvic pain. The patient's pertinent negatives include no vaginal discharge. This is a new problem. Episode onset: 01/26/18. The problem occurs constantly. The problem has been unchanged. Pain severity now: 6/10. The problem affects both sides. She is pregnant. Associated symptoms include nausea. Pertinent negatives include no chills, dysuria, fever, frequency or vomiting. The vaginal discharge was normal. There has been no bleeding. Nothing aggravates the symptoms. She has tried acetaminophen for the symptoms. The treatment provided no relief. Her menstrual history has been irregular (LMP: 11/09/17).   Past Medical History:  Diagnosis Date  . Asthma    seasonal  . Heart murmur    as a child  . Hypoglycemia associated with diabetes (HCC)   . Obesity, morbid (HCC)   . Pancreatitis   . Tachycardia   . Type 1 diabetes mellitus not at goal [facility])    since age of 22 years old    Past Surgical History:  Procedure Laterality Date  . CESAREAN SECTION N/A 03/17/2016   Procedure: CESAREAN SECTION;  Surgeon: Marlow Baars, MD;  Location: [facility] BIRTHING SUITES;  Service: Obstetrics;  Laterality: N/A;  REQUEST RNFA  . CHOLECYSTECTOMY      Family History  Problem Relation Age of Onset  . Thyroid disease Mother   . Obesity Mother   . Diabetes Father   . Obesity Father   . Obesity Sister   . Cancer Maternal Grandmother   . Obesity Paternal Grandfather     Social History   Tobacco Use  . Smoking status: Never Smoker  . Smokeless tobacco: Never Used  Substance Use Topics  . Alcohol use: No    Alcohol/week: 0.0 oz  . Drug  use: No    Allergies:  Allergies  Allergen Reactions  . Enalapril Other (See Comments)    "Weak and blacked out" couldn't stand up. Pt states it dropped her BP too low   . Morphine And Related Rash    Medications Prior to Admission  Medication Sig Dispense Refill Last Dose  . acetaminophen (TYLENOL) 325 MG tablet Take 650 mg by mouth every 6 (six) hours as needed for moderate pain.   01/28/2018 at Unknown time  . insulin detemir (LEVEMIR) 100 UNIT/ML injection Inject 60 Units into the skin at bedtime.   01/28/2018 at Unknown time  . NOVOLOG 100 UNIT/ML injection inject 100 units subcutaneously daily (Patient taking differently: sliding scale 1-100 units depending on BG 3 times a day with meals) 30 mL 2 01/29/2018 at Unknown time  . Prenatal Vit-Fe Fumarate-FA (PRENATAL MULTIVITAMIN) TABS tablet Take 1 tablet by mouth at bedtime.   01/28/2018 at Unknown time  . LANTUS 100 UNIT/ML injection inject 45 units subcutaneously at bedtime 30 mL 11     Review of Systems  Constitutional: Negative for chills and fever.  Gastrointestinal: Positive for nausea. Negative for vomiting.  Genitourinary: Positive for pelvic pain. Negative for dysuria, frequency, vaginal bleeding and vaginal discharge.   Physical Exam   Temperature 98.7 F (37.1 C), resp. rate 20, height 5\' 4"  (1.626 m), weight 269 lb (122 kg),  unknown if currently breastfeeding.  Physical Exam  Nursing note and vitals reviewed. Constitutional: She is oriented to person, place, and time. She appears well-developed and well-nourished. No distress.  HENT:  Head: Normocephalic.  Cardiovascular: Normal rate.  Respiratory: Effort normal.  GI: Soft. There is no tenderness. There is no rebound.  Genitourinary:  Genitourinary Comments: No CVA tenderness   Neurological: She is alert and oriented to person, place, and time.  Skin: Skin is warm and dry.  Psychiatric: She has a normal mood and affect.     Results for orders placed or  performed during the hospital encounter of 01/29/18 (from the past 24 hour(s))  Urinalysis, Routine w reflex microscopic     Status: Abnormal   Collection Time: 01/29/18  7:31 PM  Result Value Ref Range   Color, Urine YELLOW YELLOW   APPearance CLEAR CLEAR   Specific Gravity, Urine 1.018 1.005 - 1.030   pH 6.0 5.0 - 8.0   Glucose, UA >=500 (A) NEGATIVE mg/dL   Hgb urine dipstick NEGATIVE NEGATIVE   Bilirubin Urine NEGATIVE NEGATIVE   Ketones, ur NEGATIVE NEGATIVE mg/dL   Protein, ur NEGATIVE NEGATIVE mg/dL   Nitrite NEGATIVE NEGATIVE   Leukocytes, UA NEGATIVE NEGATIVE   RBC / HPF 0-5 0 - 5 RBC/hpf   WBC, UA 0-5 0 - 5 WBC/hpf   Bacteria, UA RARE (A) NONE SEEN  Pregnancy, urine POC     Status: Abnormal   Collection Time: 01/29/18  7:45 PM  Result Value Ref Range   Preg Test, Ur POSITIVE (A) NEGATIVE  Wet prep, genital     Status: Abnormal   Collection Time: 01/29/18  8:12 PM  Result Value Ref Range   Yeast Wet Prep HPF POC NONE SEEN NONE SEEN   Trich, Wet Prep NONE SEEN NONE SEEN   Clue Cells Wet Prep HPF POC NONE SEEN NONE SEEN   WBC, Wet Prep HPF POC FEW (A) NONE SEEN   Sperm NONE SEEN   CBC     Status: Abnormal   Collection Time: 01/29/18  8:18 PM  Result Value Ref Range   WBC 11.4 (H) 4.0 - 10.5 K/uL   RBC 4.83 3.87 - 5.11 MIL/uL   Hemoglobin 14.5 12.0 - 15.0 g/dL   HCT 16.1 09.6 - 04.5 %   MCV 88.4 78.0 - 100.0 fL   MCH 30.0 26.0 - 34.0 pg   MCHC 34.0 30.0 - 36.0 g/dL   RDW 40.9 81.1 - 91.4 %   Platelets 290 150 - 400 K/uL  hCG, quantitative, pregnancy     Status: Abnormal   Collection Time: 01/29/18  8:18 PM  Result Value Ref Range   hCG, Beta Chain, Quant, S 117,290 (H) <5 mIU/mL  Glucose, capillary     Status: Abnormal   Collection Time: 01/29/18  8:44 PM  Result Value Ref Range   Glucose-Capillary 138 (H) 65 - 99 mg/dL   US Ob Comp Less 14 Wks  Result Date: 01/29/2018 CLINICAL DATA:  Cramps.  Pregnant patient. EXAM: OBSTETRIC <14 WK ULTRASOUND TECHNIQUE:  Transabdominal ultrasound was performed for evaluation of the gestation as well as the maternal uterus and adnexal regions. COMPARISON:  None. FINDINGS: Intrauterine gestational sac: Single Yolk sac:  Not Visualized. Embryo:  Visualized. Cardiac Activity: Visualized. Heart Rate: 165 bpm MSD:   mm    w     d CRL:   32.3 mm   10 w 1 d  [place] EDC: August 26, 2018 Subchorionic hemorrhage:  None visualized. Maternal uterus/adnexae: Normal ovaries. IMPRESSION: Single live IUP.  No cause for cramps identified. Electronically Signed   By: Gerome Sam III M.D   On: 01/29/2018 21:20   MAU Course  Procedures  MDM Patient has had ibuprofen here. She reports that the cramping has improved.   Assessment and Plan   1. Pelvic pain in pregnancy, antepartum, first trimester   2. [redacted] weeks gestation of pregnancy   3. Intrauterine pregnancy    DC home Comfort measures reviewed  1st Trimester precautions  Bleeding precautions RX: none  Return to MAU as needed FU with OB as planned  Follow-up Information    Marlow Baars, MD Follow up.   Specialty:  Obstetrics Contact information: [address] [place] [address] [phone]            Thressa Sheller 01/29/2018, 8:04 PM

## 2018-01-29 NOTE — Discharge Instructions (Signed)

## 2018-01-31 LAB — CULTURE, OB URINE

## 2018-02-01 LAB — GC/CHLAMYDIA PROBE AMP (~~LOC~~) NOT AT ARMC
Chlamydia: NEGATIVE
Neisseria Gonorrhea: NEGATIVE

## 2018-02-10 LAB — OB RESULTS CONSOLE GC/CHLAMYDIA
Chlamydia: NEGATIVE
Gonorrhea: NEGATIVE

## 2018-02-10 LAB — OB RESULTS CONSOLE ABO/RH: RH Type: POSITIVE

## 2018-02-17 LAB — OB RESULTS CONSOLE HEPATITIS B SURFACE ANTIGEN: Hepatitis B Surface Ag: NEGATIVE

## 2018-02-17 LAB — OB RESULTS CONSOLE RPR: RPR: NONREACTIVE

## 2018-02-17 LAB — OB RESULTS CONSOLE RUBELLA ANTIBODY, IGM: Rubella: IMMUNE

## 2018-02-17 LAB — OB RESULTS CONSOLE HIV ANTIBODY (ROUTINE TESTING): HIV: NONREACTIVE

## 2018-03-23 ENCOUNTER — Other Ambulatory Visit (HOSPITAL_COMMUNITY): Payer: Self-pay | Admitting: Obstetrics

## 2018-03-23 DIAGNOSIS — Z3689 Encounter for other specified antenatal screening: Secondary | ICD-10-CM

## 2018-03-25 ENCOUNTER — Encounter (HOSPITAL_COMMUNITY): Payer: Self-pay

## 2018-03-30 DIAGNOSIS — R002 Palpitations: Secondary | ICD-10-CM | POA: Insufficient documentation

## 2018-04-01 ENCOUNTER — Other Ambulatory Visit (HOSPITAL_COMMUNITY): Payer: Self-pay | Admitting: *Deleted

## 2018-04-01 ENCOUNTER — Other Ambulatory Visit (HOSPITAL_COMMUNITY): Payer: Self-pay | Admitting: Obstetrics

## 2018-04-01 ENCOUNTER — Encounter (HOSPITAL_COMMUNITY): Payer: Self-pay

## 2018-04-01 ENCOUNTER — Ambulatory Visit (HOSPITAL_COMMUNITY)
Admission: RE | Admit: 2018-04-01 | Discharge: 2018-04-01 | Disposition: A | Discharge status: Home / Self Care | Payer: BC Managed Care – PPO | Source: Ambulatory Visit | Attending: Obstetrics | Admitting: Obstetrics

## 2018-04-01 ENCOUNTER — Ambulatory Visit (HOSPITAL_BASED_OUTPATIENT_CLINIC_OR_DEPARTMENT_OTHER)
Admission: RE | Admit: 2018-04-01 | Discharge: 2018-04-01 | Disposition: A | Discharge status: Home / Self Care | Payer: BC Managed Care – PPO | Source: Ambulatory Visit | Attending: Obstetrics | Admitting: Obstetrics

## 2018-04-01 DIAGNOSIS — Z9049 Acquired absence of other specified parts of digestive tract: Secondary | ICD-10-CM | POA: Insufficient documentation

## 2018-04-01 DIAGNOSIS — O24012 Pre-existing diabetes mellitus, type 1, in pregnancy, second trimester: Secondary | ICD-10-CM

## 2018-04-01 DIAGNOSIS — Z7982 Long term (current) use of aspirin: Secondary | ICD-10-CM | POA: Diagnosis not present

## 2018-04-01 DIAGNOSIS — Z362 Encounter for other antenatal screening follow-up: Secondary | ICD-10-CM

## 2018-04-01 DIAGNOSIS — Z3A20 20 weeks gestation of pregnancy: Secondary | ICD-10-CM | POA: Insufficient documentation

## 2018-04-01 DIAGNOSIS — Z794 Long term (current) use of insulin: Secondary | ICD-10-CM | POA: Insufficient documentation

## 2018-04-01 DIAGNOSIS — Z363 Encounter for antenatal screening for malformations: Secondary | ICD-10-CM

## 2018-04-01 DIAGNOSIS — Z833 Family history of diabetes mellitus: Secondary | ICD-10-CM | POA: Diagnosis not present

## 2018-04-01 DIAGNOSIS — Z3689 Encounter for other specified antenatal screening: Secondary | ICD-10-CM | POA: Insufficient documentation

## 2018-04-01 DIAGNOSIS — O99212 Obesity complicating pregnancy, second trimester: Secondary | ICD-10-CM | POA: Diagnosis not present

## 2018-04-01 DIAGNOSIS — O34219 Maternal care for unspecified type scar from previous cesarean delivery: Secondary | ICD-10-CM | POA: Insufficient documentation

## 2018-04-01 DIAGNOSIS — E669 Obesity, unspecified: Secondary | ICD-10-CM | POA: Insufficient documentation

## 2018-04-01 DIAGNOSIS — Z885 Allergy status to narcotic agent status: Secondary | ICD-10-CM | POA: Diagnosis not present

## 2018-04-01 NOTE — Consult Note (Signed)
CONSULT NOTE      MATERNAL FETAL MEDICINE CONSULT  Patient Name: Danielle Baker  Medical Record Number: [id number] Date of Birth: 1996/04/04  Requesting Physician Name: Marlow Baars, MD Date of Service: 04/01/18  I had the pleasure of seeing [facility]  She is a G2, P1 at [redacted] weeks gestation.  She has type 1 diabetes and is here for fetal anatomy scan and consultation. Diabetes was diagnosed at age 22 patient has been on insulin since then.  She reports she had used insulin pump, but multiple dose injections seem to control her diabetes better.  She is currently on Levemir 75 units at night and sliding scale Humalog with meals (1-5 carb ratio).  He reports her fasting and postprandial levels are within normal limits. Patient had recent ophthalmology examination and there was no evidence of retinopathy.  Does not have neuropathy or nephropathy. Her most-recent hemoglobin A1C was 6.7%. She does not have hypertension or any other chronic medical conditions. Past surgical history: Cesarean section, laparoscopic cholecystectomy (2017). Medications: Prenatal vitamins, insulin, aspirin. Allergies: Morphine (hives). Social history: Denies tobacco drug or alcohol use.  She has been married for years and her husband is in good health.  She works as a Youth worker in school. Family history: Father died of diabetic complications possibly diabetic ketoacidosis at age 79.  Mother is healthy.  No history of venous thromboembolism in the family. Obstetric history: Term cesarean delivery in July 2017 of a female infant weighing 9 pounds 6 ounces at birth.  Cesarean section was electively performed because of macrosomia in diabetes. GYN history: PCOS.  No history of abnormal Pap smears or cervical surgeries.  Pregestational diabetes:  Assuming her hemoglobin A1C was under 7% in the periconception period (recent one was 6.7%), the likelihood of fetal congenital  malformations is very low. However, fetal congenital malformations are slightly increased even if diabetes is well controlled.  I explained the normal parameters of blood glucose levels and encouraged her to regularly check her blood glucose levels. Patient seems very knowledgeable and motivated and reports her blood glucose levels are within normal range. I discussed the importance of good control of diabetes to prevent adverse fetal and neonatal outcomes.   Other complications include fetal macrosomia leading shoulder dystocia or neurological injuries at birth (Erb's palsy). Neonatal complications including respiratory-distress syndrome, hypoglycemia can also occur. In poorly-controlled diabetes, the likelihood of stillbirth is increased.  I discussed management that includes diet, exercise and insulin. Insulin is the mainstay of management. Metformin can be continued with insulin.  I discussed ultrasound protocol. Serial fetal growth assessments and weekly antenatal testing beginning at 32 weeks till delivery will be performed.  Delivery is recommended at 39 weeks if diabetes is well controlled. Early term delivery (37 to 39 weeks) may be recommended if poor control is seen.  I discussed the benefit of low-dose aspirin in reducing the likelihood of developing preeclampsia. Patient is already taking low-dose aspirin.  Marginal cord insertion is not associated with adverse fetal outcomes. In some cases growth restriction can be seen. Patient will be having serial fetal growth assessment because of diabetes and growth restriction can be detected.  Previous cesarean delivery: I discussed VBAC and its complication of scar dehiscence (1%). Previous cesarean deliveries increase the likelihood of placenta previa or accreta in subsequent pregnancies.  Recommendations: -An appointment was made for her to return in 4 weeks for completion of fetal anatomy scan. -Fetal growth assessments  every 4  weeks. -Patient has an appointment for fetal echocardiography scheduled by your office (04/26/18). -Weekly antenatal testing from 32 weeks till delivery. -Delivery at 39 weeks. -Continue low-dose aspirin.   Thank you for your consultation.  If you have any questions or concerns please do not hesitate to contact me at the [facility].  Consultation including face-to-face counseling 40 minutes.

## 2018-04-29 ENCOUNTER — Encounter (HOSPITAL_COMMUNITY): Payer: Self-pay

## 2018-04-29 ENCOUNTER — Ambulatory Visit (HOSPITAL_COMMUNITY)
Admission: RE | Admit: 2018-04-29 | Discharge: 2018-04-29 | Disposition: A | Discharge status: Home / Self Care | Payer: BC Managed Care – PPO | Source: Ambulatory Visit | Attending: Obstetrics | Admitting: Obstetrics

## 2018-04-29 DIAGNOSIS — O34219 Maternal care for unspecified type scar from previous cesarean delivery: Secondary | ICD-10-CM | POA: Diagnosis not present

## 2018-04-29 DIAGNOSIS — O99212 Obesity complicating pregnancy, second trimester: Secondary | ICD-10-CM

## 2018-04-29 DIAGNOSIS — O24012 Pre-existing diabetes mellitus, type 1, in pregnancy, second trimester: Secondary | ICD-10-CM

## 2018-04-29 DIAGNOSIS — Z362 Encounter for other antenatal screening follow-up: Secondary | ICD-10-CM | POA: Diagnosis not present

## 2018-04-29 DIAGNOSIS — Z3A22 22 weeks gestation of pregnancy: Secondary | ICD-10-CM

## 2018-05-04 ENCOUNTER — Other Ambulatory Visit (HOSPITAL_COMMUNITY): Payer: Self-pay | Admitting: *Deleted

## 2018-05-04 DIAGNOSIS — O24019 Pre-existing diabetes mellitus, type 1, in pregnancy, unspecified trimester: Secondary | ICD-10-CM

## 2018-05-26 ENCOUNTER — Encounter (HOSPITAL_COMMUNITY): Payer: Self-pay | Admitting: *Deleted

## 2018-05-26 ENCOUNTER — Inpatient Hospital Stay (HOSPITAL_COMMUNITY)
Admission: AD | Admit: 2018-05-26 | Discharge: 2018-05-26 | Disposition: A | Discharge status: Home / Self Care | Payer: BC Managed Care – PPO | Source: Ambulatory Visit | Attending: Obstetrics | Admitting: Obstetrics

## 2018-05-26 DIAGNOSIS — R030 Elevated blood-pressure reading, without diagnosis of hypertension: Secondary | ICD-10-CM | POA: Diagnosis not present

## 2018-05-26 DIAGNOSIS — Z7982 Long term (current) use of aspirin: Secondary | ICD-10-CM | POA: Insufficient documentation

## 2018-05-26 DIAGNOSIS — O162 Unspecified maternal hypertension, second trimester: Secondary | ICD-10-CM

## 2018-05-26 DIAGNOSIS — O368120 Decreased fetal movements, second trimester, not applicable or unspecified: Secondary | ICD-10-CM | POA: Insufficient documentation

## 2018-05-26 DIAGNOSIS — Z3689 Encounter for other specified antenatal screening: Secondary | ICD-10-CM

## 2018-05-26 DIAGNOSIS — Z3A26 26 weeks gestation of pregnancy: Secondary | ICD-10-CM | POA: Diagnosis not present

## 2018-05-26 LAB — PROTEIN / CREATININE RATIO, URINE
Creatinine, Urine: 29 mg/dL
Total Protein, Urine: <6 mg/dL

## 2018-05-26 LAB — CBC
HCT: 39.3 % (ref 36.0–46.0)
Hemoglobin: 13.2 g/dL (ref 12.0–15.0)
MCH: 29.7 pg (ref 26.0–34.0)
MCHC: 33.6 g/dL (ref 30.0–36.0)
MCV: 88.5 fL (ref 80.0–100.0)
Platelets: 296 10*3/uL (ref 150–400)
RBC: 4.44 MIL/uL (ref 3.87–5.11)
RDW: 12.7 % (ref 11.5–15.5)
WBC: 11.8 10*3/uL — ABNORMAL HIGH (ref 4.0–10.5)
nRBC: 0 % (ref 0.0–0.2)

## 2018-05-26 LAB — COMPREHENSIVE METABOLIC PANEL
ALT: 15 U/L (ref 0–44)
AST: 13 U/L — ABNORMAL LOW (ref 15–41)
Albumin: 3 g/dL — ABNORMAL LOW (ref 3.5–5.0)
Alkaline Phosphatase: 76 U/L (ref 38–126)
Anion gap: 7 (ref 5–15)
BUN: 9 mg/dL (ref 6–20)
CO2: 24 mmol/L (ref 22–32)
Calcium: 8.7 mg/dL — ABNORMAL LOW (ref 8.9–10.3)
Chloride: 107 mmol/L (ref 98–111)
Creatinine, Ser: 0.41 mg/dL — ABNORMAL LOW (ref 0.44–1.00)
GFR calc Af Amer: >60 mL/min (ref >60)
GFR calc non Af Amer: >60 mL/min (ref >60)
Glucose, Bld: 106 mg/dL — ABNORMAL HIGH (ref 70–99)
Potassium: 3.9 mmol/L (ref 3.5–5.1)
Sodium: 138 mmol/L (ref 135–145)
Total Bilirubin: 0.6 mg/dL (ref 0.3–1.2)
Total Protein: 6.8 g/dL (ref 6.5–8.1)

## 2018-05-26 NOTE — MAU Provider Note (Signed)
Obstetric Attending MAU Note  Chief Complaint:  Decreased Fetal Movement   First Provider Initiated Contact with Patient 05/26/18 1119     HPI: Danielle Baker is a 22 y.o. G2P1001 at [redacted]w[redacted]d who presents to maternity admissions reporting decreased fetal movement since today. She is very concerned.  Denies contractions, leakage of fluid or vaginal bleeding. Denies any headache, visual changes, RUQ/epigastric pain.  Pregnancy Course: Receives care at [facility] Patient Active Problem List   Diagnosis Date Noted  . Diabetes mellitus complicating pregnancy in third trimester, antepartum 03/18/2016  . DKA (diabetic ketoacidoses) (HCC) 08/31/2013  . Hypoglycemia associated with diabetes (HCC)   . DM autonomic neuropathy (HCC)   . 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 Goiter   . Obesity, morbid (HCC)   . Dyspepsia   . Acanthosis nigricans, acquired   . Tachycardia   . Hypertension associated with diabetes (HCC)   . Uncontrolled type 1 diabetes mellitus (HCC) 12/16/2010  . Goiter, unspecified 12/16/2010  . Obesity 12/16/2010    Past Medical History:  Diagnosis Date  . Asthma    seasonal  . Heart murmur    as a child  . Hypoglycemia associated with diabetes (HCC)   . Obesity, morbid (HCC)   . Pancreatitis   . Tachycardia   . Type 1 diabetes mellitus not at goal [facility])    since age of 22 years old    OB History  Gravida Para Term Preterm AB Living  2 1 1     1   SAB TAB Ectopic Multiple Live Births        0 1    # Outcome Date GA Lbr Len/2nd Weight Sex Delivery Anes PTL Lv  2 Current           1 Term 03/17/16 [redacted]w[redacted]d  4270 g F CS-LTranv Spinal  LIV    Past Surgical History:  Procedure Laterality Date  . CESAREAN SECTION N/A 03/17/2016   Procedure: CESAREAN SECTION;  Surgeon: Marlow Baars, MD;  Location: [facility] BIRTHING SUITES;  Service: Obstetrics;  Laterality: N/A;  REQUEST RNFA  . CHOLECYSTECTOMY      Family History: Family History   Problem Relation Age of Onset  . Thyroid disease Mother   . Obesity Mother   . Diabetes Father   . Obesity Father   . Obesity Sister   . Cancer Maternal Grandmother   . Obesity Paternal Grandfather     Social History: Social History   Tobacco Use  . Smoking status: Never Smoker  . Smokeless tobacco: Never Used  Substance Use Topics  . Alcohol use: No    Alcohol/week: 0.0 standard drinks  . Drug use: No    Allergies:  Allergies  Allergen Reactions  . Enalapril Other (See Comments)    "Weak and blacked out" couldn't stand up. Pt states it dropped her BP too low   . Morphine And Related Rash    Medications Prior to Admission  Medication Sig Dispense Refill Last Dose  . acetaminophen (TYLENOL) 325 MG tablet Take 650 mg by mouth every 6 (six) hours as needed for moderate pain.   05/25/2018 at Unknown time  . aspirin 81 MG chewable tablet Chew 81 mg by mouth at bedtime.    05/25/2018 at Unknown time  . insulin detemir (LEVEMIR) 100 UNIT/ML injection Inject 60 Units into the skin at bedtime.   05/25/2018 at Unknown time  . NOVOLOG 100 UNIT/ML injection inject 100 units subcutaneously daily (Patient  taking differently: Inject 20 Units into the skin 3 (three) times daily with meals. ) 30 mL 2 05/26/2018 at 0700  . Prenatal Vit-Fe Fumarate-FA (PRENATAL MULTIVITAMIN) TABS tablet Take 1 tablet by mouth at bedtime.   05/25/2018 at Unknown time  . LANTUS 100 UNIT/ML injection inject 45 units subcutaneously at bedtime (Patient not taking: Reported on 04/01/2018) 30 mL 11 Not Taking at Unknown time    ROS: Pertinent findings in history of present illness.  Physical Exam  Blood pressure 129/69, pulse 86, temperature 98.2 F (36.8 C), temperature source Oral, resp. rate 18, last menstrual period 11/09/2017, SpO2 97 %, unknown if currently breastfeeding.   Patient Vitals for the past 24 hrs:  BP Temp Temp src Pulse Resp SpO2  05/26/18 1200 129/69 - - 86 - -  05/26/18 1145 128/77 - - 89 - -   05/26/18 1130 133/66 - - 97 - -  05/26/18 1115 130/66 - - 88 - -  05/26/18 1100 126/63 - - (!) 131 - -  05/26/18 1057 129/66 - - 98 - -  05/26/18 1034 (!) 149/70 98.2 F (36.8 C) Oral 96 18 97 %    CONSTITUTIONAL: Well-developed, well-nourished female in no acute distress.  HENT:  Normocephalic, atraumatic, External right and left ear normal. Oropharynx is clear and moist EYES: Conjunctivae and EOM are normal. Pupils are equal, round, and reactive to light. No scleral icterus.  NECK: Normal range of motion, supple, no masses SKIN: Skin is warm and dry. No rash noted. Not diaphoretic. No erythema. No pallor. NEUROLGIC: Alert and oriented to person, place, and time. Normal reflexes, muscle tone coordination. No cranial nerve deficit noted. PSYCHIATRIC: Normal mood and affect. Normal behavior. Normal judgment and thought content. CARDIOVASCULAR: Normal heart rate noted, regular rhythm RESPIRATORY: Effort and breath sounds normal, no problems with respiration noted ABDOMEN: Soft, nontender, nondistended, gravid appropriate for gestational age MUSCULOSKELETAL: Normal range of motion. No edema and no tenderness. 2+ distal pulses. PELVIC: Deferred   FHT:  Baseline 130 , moderate variability, accelerations present, no decelerations Contractions: none   Labs: Results for orders placed or performed during the hospital encounter of 05/26/18 (from the past 24 hour(s))  CBC     Status: Abnormal   Collection Time: 05/26/18 11:37 AM  Result Value Ref Range   WBC 11.8 (H) 4.0 - 10.5 K/uL   RBC 4.44 3.87 - 5.11 MIL/uL   Hemoglobin 13.2 12.0 - 15.0 g/dL   HCT 16.1 09.6 - 04.5 %   MCV 88.5 80.0 - 100.0 fL   MCH 29.7 26.0 - 34.0 pg   MCHC 33.6 30.0 - 36.0 g/dL   RDW 40.9 81.1 - 91.4 %   Platelets 296 150 - 400 K/uL   nRBC 0.0 0.0 - 0.2 %  Comprehensive metabolic panel     Status: Abnormal   Collection Time: 05/26/18 11:37 AM  Result Value Ref Range   Sodium 138 135 - 145 mmol/L   Potassium  3.9 3.5 - 5.1 mmol/L   Chloride 107 98 - 111 mmol/L   CO2 24 22 - 32 mmol/L   Glucose, Bld 106 (H) 70 - 99 mg/dL   BUN 9 6 - 20 mg/dL   Creatinine, Ser 7.82 (L) 0.44 - 1.00 mg/dL   Calcium 8.7 (L) 8.9 - 10.3 mg/dL   Total Protein 6.8 6.5 - 8.1 g/dL   Albumin 3.0 (L) 3.5 - 5.0 g/dL   AST 13 (L) 15 - 41 U/L   ALT 15 0 -  44 U/L   Alkaline Phosphatase 76 38 - 126 U/L   Total Bilirubin 0.6 0.3 - 1.2 mg/dL   GFR calc non Af Amer >60 >60 mL/min   GFR calc Af Amer >60 >60 mL/min   Anion gap 7 5 - 15    Imaging:  [place] Mfm Ob Follow Up  Result Date: 04/29/2018 ----------------------------------------------------------------------  OBSTETRICS REPORT                       (Signed Final 04/29/2018 05:00 pm) ---------------------------------------------------------------------- Patient Info  ID #:       [id number]                          D.O.B.:  [birth year] (21 yrs)  Name:       Danielle Baker [facility]                   Visit Date: 04/29/2018 04:23 pm ---------------------------------------------------------------------- Performed By  Performed By:     Lenise Arena        Ref. Address:     [facility]                    [facility]                                                             OBGYN                                                             [address]                                                             [address]                                                             [place], [place]                                                             [address]  Attending:        Noralee Space MD  [facility]  Referred By:      Marlow Baars                    MD ---------------------------------------------------------------------- Orders   #  Description                          Code         Ordered By   1  [place] MFM OB FOLLOW UP                  16109.60     Noralee Space   ----------------------------------------------------------------------   #  Order #                    Accession #                 Episode #   1  [id number]                  [id number]                  [id number]  ---------------------------------------------------------------------- Indications   [redacted] weeks gestation of pregnancy                Z3A.22   Encounter for other antenatal screening        Z36.2   follow-up   History of cesarean delivery, currently        O34.219   pregnant   Pre-existing diabetes, type 1, in pregnancy,   O24.012   second trimester   Obesity complicating pregnancy, second         O99.212   trimester  ---------------------------------------------------------------------- Vital Signs  Weight (lb): 279                               Height:        5'4"  BMI:         47.89 ---------------------------------------------------------------------- Fetal Evaluation  Num Of Fetuses:         1  Fetal Heart Rate(bpm):  136  Cardiac Activity:       Observed  Presentation:           Breech  Placenta:               Anterior  P. Cord Insertion:      Previously Visualized  Amniotic Fluid  AFI FV:      Within normal limits                              Largest Pocket(cm)                              5.73 ---------------------------------------------------------------------- Biometry  BPD:      54.6  mm     G. Age:  22w 4d         37  %    CI:        69.99   %    70 - 86  FL/HC:      19.0   %    19.2 - 20.8  HC:      208.2  mm     G. Age:  22w 6d         38  %    HC/AC:      1.03        1.05 - 1.21  AC:      201.3  mm     G. Age:  24w 5d         91  %    FL/BPD:     72.5   %    71 - 87  FL:       39.6  mm     G. Age:  22w 5d         36  %    FL/AC:      19.7   %    20 - 24  HUM:      36.4  mm     G. Age:  22w 5d         39  %  LV:        7.4  mm  Est. FW:     623  gm      1 lb 6 oz     66  %  ---------------------------------------------------------------------- OB History  Gravidity:    2  Living:       1 ---------------------------------------------------------------------- Gestational Age  LMP:           24w 3d        Date:  11/09/17                 EDD:   08/16/18  U/S Today:     23w 2d                                        EDD:   08/24/18  Best:          22w 6d     Det. By:  Previous Ultrasound      EDD:   08/27/18 ---------------------------------------------------------------------- Anatomy  Cranium:               Appears normal         Aortic Arch:            Previously seen  Cavum:                 Appears normal         Ductal Arch:            Not well visualized  Ventricles:            Appears normal         Diaphragm:              Appears normal  Choroid Plexus:        Previously seen        Stomach:                Appears normal, left  sided  Cerebellum:            Previously seen        Abdomen:                Appears normal  Posterior Fossa:       Previously seen        Abdominal Wall:         Appears nml (cord                                                                        insert, abd wall)  Nuchal Fold:           Previously seen        Cord Vessels:           Appears normal (3                                                                        vessel cord)  Face:                  Appears normal         Kidneys:                Appear normal                         (orbits and profile)  Lips:                  Appears normal         Bladder:                Appears normal  Thoracic:              Appears normal         Spine:                  Limited views                                                                        appear normal  Heart:                 Appears normal         Upper Extremities:      Previously seen                         (4CH, axis, and situs  RVOT:                  Appears normal          Lower Extremities:      Previously seen  LVOT:                  Appears normal  Other:  Fetus appears to be a female. Technically difficult due to maternal          habitus and fetal position. ---------------------------------------------------------------------- Cervix Uterus Adnexa  Cervix  Length:           4.56  cm.  Not visualized (advanced GA >24wks) ---------------------------------------------------------------------- Impression  Patient returned for completion of fetal anatomy. Fetal growth  is appropriate for gestational age. Amniotic fluid is normal  and good fetal activity is seen.  Fetal anatomical survey is complete.  Patient reports she had normal fetal echocardiography.  She reports her diabetes is well-controlled on insulin. ---------------------------------------------------------------------- Recommendations  An appointment was made for her to return in 4 weeks for  fetal growth assessment. ----------------------------------------------------------------------                  Noralee Space, MD Electronically Signed Final Report   04/29/2018 05:00 pm ----------------------------------------------------------------------   MAU Course: Reactive NST noted, patient reassured HTN in pregnancy labs done.  Assessment: 1. NST (non-stress test) reactive on fetal surveillance   2. Decreased fetal movements in second trimester, single or unspecified fetus   3. Elevated blood pressure complicating pregnancy, antepartum, second trimester     Plan: Normal labs on evaluation, reactive NST today. Preterm labor precautions and fetal kick counts reviewed Follow up with OB provider as scheduled next week Discharge home   Allergies as of 05/26/2018      Reactions   Enalapril Other (See Comments)   "Weak and blacked out" couldn't stand up. Pt states it dropped her BP too low    Morphine And Related Rash      Medication List    TAKE these medications   acetaminophen 325 MG tablet Commonly  known as:  TYLENOL Take 650 mg by mouth every 6 (six) hours as needed for moderate pain.   aspirin 81 MG chewable tablet Chew 81 mg by mouth at bedtime.   insulin detemir 100 UNIT/ML injection Commonly known as:  LEVEMIR Inject 60 Units into the skin at bedtime.   LANTUS 100 UNIT/ML injection Generic drug:  insulin glargine inject 45 units subcutaneously at bedtime   NOVOLOG 100 UNIT/ML injection Generic drug:  insulin aspart inject 100 units subcutaneously daily What changed:  See the new instructions.   prenatal multivitamin Tabs tablet Take 1 tablet by mouth at bedtime.       Tereso Newcomer, MD 05/26/2018 12:21 PM

## 2018-05-26 NOTE — MAU Note (Signed)
Has not felt fetal movement today

## 2018-05-26 NOTE — Discharge Instructions (Signed)

## 2018-05-27 ENCOUNTER — Ambulatory Visit (HOSPITAL_COMMUNITY): Payer: BC Managed Care – PPO

## 2018-05-27 ENCOUNTER — Encounter (HOSPITAL_COMMUNITY): Payer: Self-pay

## 2018-06-30 ENCOUNTER — Encounter (HOSPITAL_COMMUNITY): Payer: Self-pay | Admitting: Anesthesiology

## 2018-06-30 ENCOUNTER — Other Ambulatory Visit: Payer: Self-pay

## 2018-06-30 ENCOUNTER — Encounter (HOSPITAL_COMMUNITY)
Admission: AD | Disposition: A | Discharge status: Home / Self Care | Payer: Self-pay | Source: Ambulatory Visit | Attending: Obstetrics and Gynecology

## 2018-06-30 ENCOUNTER — Inpatient Hospital Stay (HOSPITAL_COMMUNITY)
Admission: AD | Admit: 2018-06-30 | Discharge: 2018-06-30 | Disposition: A | Discharge status: Home / Self Care | Payer: BC Managed Care – PPO | Source: Ambulatory Visit | Attending: Obstetrics and Gynecology | Admitting: Obstetrics and Gynecology

## 2018-06-30 ENCOUNTER — Encounter (HOSPITAL_COMMUNITY): Payer: Self-pay | Admitting: *Deleted

## 2018-06-30 DIAGNOSIS — O163 Unspecified maternal hypertension, third trimester: Secondary | ICD-10-CM | POA: Diagnosis not present

## 2018-06-30 DIAGNOSIS — Z833 Family history of diabetes mellitus: Secondary | ICD-10-CM | POA: Diagnosis not present

## 2018-06-30 DIAGNOSIS — Z9049 Acquired absence of other specified parts of digestive tract: Secondary | ICD-10-CM | POA: Insufficient documentation

## 2018-06-30 DIAGNOSIS — R030 Elevated blood-pressure reading, without diagnosis of hypertension: Secondary | ICD-10-CM | POA: Diagnosis present

## 2018-06-30 DIAGNOSIS — Z3689 Encounter for other specified antenatal screening: Secondary | ICD-10-CM

## 2018-06-30 DIAGNOSIS — O99513 Diseases of the respiratory system complicating pregnancy, third trimester: Secondary | ICD-10-CM | POA: Diagnosis not present

## 2018-06-30 DIAGNOSIS — O24013 Pre-existing diabetes mellitus, type 1, in pregnancy, third trimester: Secondary | ICD-10-CM | POA: Insufficient documentation

## 2018-06-30 DIAGNOSIS — Z3A31 31 weeks gestation of pregnancy: Secondary | ICD-10-CM | POA: Insufficient documentation

## 2018-06-30 DIAGNOSIS — Z79899 Other long term (current) drug therapy: Secondary | ICD-10-CM | POA: Diagnosis not present

## 2018-06-30 DIAGNOSIS — O26893 Other specified pregnancy related conditions, third trimester: Secondary | ICD-10-CM | POA: Insufficient documentation

## 2018-06-30 DIAGNOSIS — O133 Gestational [pregnancy-induced] hypertension without significant proteinuria, third trimester: Secondary | ICD-10-CM | POA: Diagnosis not present

## 2018-06-30 DIAGNOSIS — J45909 Unspecified asthma, uncomplicated: Secondary | ICD-10-CM | POA: Insufficient documentation

## 2018-06-30 DIAGNOSIS — Z885 Allergy status to narcotic agent status: Secondary | ICD-10-CM | POA: Insufficient documentation

## 2018-06-30 DIAGNOSIS — O99213 Obesity complicating pregnancy, third trimester: Secondary | ICD-10-CM | POA: Insufficient documentation

## 2018-06-30 DIAGNOSIS — Z7982 Long term (current) use of aspirin: Secondary | ICD-10-CM | POA: Insufficient documentation

## 2018-06-30 DIAGNOSIS — Z794 Long term (current) use of insulin: Secondary | ICD-10-CM | POA: Insufficient documentation

## 2018-06-30 LAB — URINALYSIS, ROUTINE W REFLEX MICROSCOPIC
Bacteria, UA: NONE SEEN
Bilirubin Urine: NEGATIVE
Glucose, UA: >=500 mg/dL — AB
Hgb urine dipstick: NEGATIVE
Ketones, ur: NEGATIVE mg/dL
Leukocytes, UA: NEGATIVE
Nitrite: NEGATIVE
Protein, ur: NEGATIVE mg/dL
Specific Gravity, Urine: 1.031 — ABNORMAL HIGH (ref 1.005–1.030)
pH: 8 (ref 5.0–8.0)

## 2018-06-30 LAB — COMPREHENSIVE METABOLIC PANEL
ALT: 17 U/L (ref 0–44)
AST: 15 U/L (ref 15–41)
Albumin: 2.9 g/dL — ABNORMAL LOW (ref 3.5–5.0)
Alkaline Phosphatase: 72 U/L (ref 38–126)
Anion gap: 8 (ref 5–15)
BUN: 12 mg/dL (ref 6–20)
CO2: 22 mmol/L (ref 22–32)
Calcium: 8.5 mg/dL — ABNORMAL LOW (ref 8.9–10.3)
Chloride: 105 mmol/L (ref 98–111)
Creatinine, Ser: 0.55 mg/dL (ref 0.44–1.00)
GFR calc Af Amer: >60 mL/min (ref >60)
GFR calc non Af Amer: >60 mL/min (ref >60)
Glucose, Bld: 245 mg/dL — ABNORMAL HIGH (ref 70–99)
Potassium: 4.3 mmol/L (ref 3.5–5.1)
Sodium: 135 mmol/L (ref 135–145)
Total Bilirubin: 0.4 mg/dL (ref 0.3–1.2)
Total Protein: 6.5 g/dL (ref 6.5–8.1)

## 2018-06-30 LAB — CBC
HCT: 38.8 % (ref 36.0–46.0)
Hemoglobin: 12.4 g/dL (ref 12.0–15.0)
MCH: 27.7 pg (ref 26.0–34.0)
MCHC: 32 g/dL (ref 30.0–36.0)
MCV: 86.6 fL (ref 80.0–100.0)
Platelets: 281 10*3/uL (ref 150–400)
RBC: 4.48 MIL/uL (ref 3.87–5.11)
RDW: 12.8 % (ref 11.5–15.5)
WBC: 10.9 10*3/uL — ABNORMAL HIGH (ref 4.0–10.5)
nRBC: 0 % (ref 0.0–0.2)

## 2018-06-30 LAB — PROTEIN / CREATININE RATIO, URINE
Creatinine, Urine: 39 mg/dL
Protein Creatinine Ratio: 0.21 mg/mg{Cre} — ABNORMAL HIGH (ref 0.00–0.15)
Total Protein, Urine: 8 mg/dL

## 2018-06-30 SURGERY — Surgical Case
Anesthesia: Regional

## 2018-06-30 MED ORDER — MORPHINE SULFATE (PF) 0.5 MG/ML IJ SOLN
INTRAMUSCULAR | Status: AC
  Filled 2018-06-30: qty 10

## 2018-06-30 MED ORDER — BUTALBITAL-APAP-CAFFEINE 50-325-40 MG PO TABS
1.0000 | ORAL_TABLET | ORAL | Status: DC | PRN
  Administered 2018-06-30: 1 via ORAL
  Filled 2018-06-30: qty 1

## 2018-06-30 MED ORDER — PHENYLEPHRINE 8 MG IN D5W 100 ML (0.08MG/ML) PREMIX OPTIME
INJECTION | INTRAVENOUS | Status: AC
  Filled 2018-06-30: qty 100

## 2018-06-30 MED ORDER — OXYTOCIN 10 UNIT/ML IJ SOLN
INTRAMUSCULAR | Status: AC
  Filled 2018-06-30: qty 4

## 2018-06-30 MED ORDER — FENTANYL CITRATE (PF) 100 MCG/2ML IJ SOLN
INTRAMUSCULAR | Status: AC
  Filled 2018-06-30: qty 2

## 2018-06-30 MED ORDER — ONDANSETRON HCL 4 MG/2ML IJ SOLN
INTRAMUSCULAR | Status: AC
  Filled 2018-06-30: qty 2

## 2018-06-30 NOTE — MAU Provider Note (Signed)
History     CSN: [id number]  Arrival date and time: 06/30/18 1536   First Provider Initiated Contact with Patient 06/30/18 1637     G2P1001 @31 .5 wks sent from office for elevated BP. Reports frontal HA intermittently over the last week. Took Tylenol but didn't help. Associated sx are blurry vision in right eye. No CP, SOB, or epigastric pain. Reports good FM. No VB, LOF, or ctx. Her pregnancy is complicated by obesity, Type I DM, and previous CS.   OB History    Gravida  2   Para  1   Term  1   Preterm      AB      Living  1     SAB      TAB      Ectopic      Multiple  0   Live Births  1           Past Medical History:  Diagnosis Date  . Asthma    seasonal  . Heart murmur    as a child  . Hypoglycemia associated with diabetes (HCC)   . Obesity, morbid (HCC)   . Pancreatitis   . Tachycardia   . Type 1 diabetes mellitus not at goal [facility])    since age of 22 years old    Past Surgical History:  Procedure Laterality Date  . CESAREAN SECTION N/A 03/17/2016   Procedure: CESAREAN SECTION;  Surgeon: Marlow Baars, MD;  Location: [facility] BIRTHING SUITES;  Service: Obstetrics;  Laterality: N/A;  REQUEST RNFA  . CHOLECYSTECTOMY      Family History  Problem Relation Age of Onset  . Thyroid disease Mother   . Obesity Mother   . Diabetes Father   . Obesity Father   . Obesity Sister   . Cancer Maternal Grandmother   . Obesity Paternal Grandfather     Social History   Tobacco Use  . Smoking status: Never Smoker  . Smokeless tobacco: Never Used  Substance Use Topics  . Alcohol use: No    Alcohol/week: 0.0 standard drinks  . Drug use: No    Allergies:  Allergies  Allergen Reactions  . Enalapril Other (See Comments)    "Weak and blacked out" couldn't stand up. Pt states it dropped her BP too low   . Morphine And Related Rash    Medications Prior to Admission  Medication Sig Dispense Refill Last Dose  . acetaminophen (TYLENOL) 325 MG tablet Take 650  mg by mouth every 6 (six) hours as needed for moderate pain.   06/30/2018 at 1300  . aspirin 81 MG chewable tablet Chew 81 mg by mouth at bedtime.    06/29/2018 at Unknown time  . insulin detemir (LEVEMIR) 100 UNIT/ML injection Inject 120 Units into the skin at bedtime.    06/29/2018 at Unknown time  . NOVOLOG 100 UNIT/ML injection inject 100 units subcutaneously daily (Patient taking differently: Inject 20 Units into the skin 3 (three) times daily with meals. ) 30 mL 2 06/30/2018 at 1300  . Prenatal Vit-Fe Fumarate-FA (PRENATAL MULTIVITAMIN) TABS tablet Take 1 tablet by mouth at bedtime.   06/29/2018 at Unknown time    Review of Systems  Eyes: Positive for visual disturbance.  Respiratory: Negative for shortness of breath.   Cardiovascular: Negative for chest pain.  Gastrointestinal: Negative for abdominal pain.  Genitourinary: Negative for vaginal bleeding.  Neurological: Positive for headaches.   Physical Exam   Blood pressure (!) 115/54, pulse 97, temperature 97.9 F (  36.6 C), temperature source Oral, resp. rate 20, height 5\' 4"  (1.626 m), weight 127.2 kg, last menstrual period 11/09/2017, SpO2 98 %, unknown if currently breastfeeding. Patient Vitals for the past 24 hrs:  BP Temp Temp src Pulse Resp SpO2 Height Weight  06/30/18 1758 - 97.9 F (36.6 C) Oral - 20 - - -  06/30/18 1745 (!) 115/54 - - 97 - - - -  06/30/18 1730 112/66 - - (!) 101 - - - -  06/30/18 1715 118/66 - - (!) 111 - - - -  06/30/18 1700 (!) 124/57 - - (!) 107 - - - -  06/30/18 1630 (!) 156/91 - - (!) 126 - - - -  06/30/18 1615 (!) 146/69 97.9 F (36.6 C) Oral (!) 117 20 98 % - -  06/30/18 1550 - - - - - - 5\' 4"  (1.626 m) 127.2 kg   Physical Exam  Nursing note and vitals reviewed. Constitutional: She is oriented to person, place, and time. She appears well-developed and well-nourished. No distress.  HENT:  Head: Normocephalic and atraumatic.  Neck: Normal range of motion.  Respiratory: Effort normal. No  respiratory distress.  Musculoskeletal: Normal range of motion. She exhibits no edema.  Neurological: She is alert and oriented to person, place, and time.  Skin: Skin is warm and dry.  Psychiatric: She has a normal mood and affect.   Results for orders placed or performed during the hospital encounter of 06/30/18 (from the past 24 hour(s))  Urinalysis, Routine w reflex microscopic     Status: Abnormal   Collection Time: 06/30/18  3:56 PM  Result Value Ref Range   Color, Urine STRAW (A) YELLOW   APPearance CLEAR CLEAR   Specific Gravity, Urine 1.031 (H) 1.005 - 1.030   pH 8.0 5.0 - 8.0   Glucose, UA >=500 (A) NEGATIVE mg/dL   Hgb urine dipstick NEGATIVE NEGATIVE   Bilirubin Urine NEGATIVE NEGATIVE   Ketones, ur NEGATIVE NEGATIVE mg/dL   Protein, ur NEGATIVE NEGATIVE mg/dL   Nitrite NEGATIVE NEGATIVE   Leukocytes, UA NEGATIVE NEGATIVE   RBC / HPF 0-5 0 - 5 RBC/hpf   WBC, UA 0-5 0 - 5 WBC/hpf   Bacteria, UA NONE SEEN NONE SEEN   Squamous Epithelial / LPF 0-5 0 - 5  Protein / creatinine ratio, urine     Status: Abnormal   Collection Time: 06/30/18  3:56 PM  Result Value Ref Range   Creatinine, Urine 39.00 mg/dL   Total Protein, Urine 8 mg/dL   Protein Creatinine Ratio 0.21 (H) 0.00 - 0.15 mg/mg[Cre]  CBC     Status: Abnormal   Collection Time: 06/30/18  4:51 PM  Result Value Ref Range   WBC 10.9 (H) 4.0 - 10.5 K/uL   RBC 4.48 3.87 - 5.11 MIL/uL   Hemoglobin 12.4 12.0 - 15.0 g/dL   HCT 78.2 95.6 - 21.3 %   MCV 86.6 80.0 - 100.0 fL   MCH 27.7 26.0 - 34.0 pg   MCHC 32.0 30.0 - 36.0 g/dL   RDW 08.6 57.8 - 46.9 %   Platelets 281 150 - 400 K/uL   nRBC 0.0 0.0 - 0.2 %  Comprehensive metabolic panel     Status: Abnormal   Collection Time: 06/30/18  4:51 PM  Result Value Ref Range   Sodium 135 135 - 145 mmol/L   Potassium 4.3 3.5 - 5.1 mmol/L   Chloride 105 98 - 111 mmol/L   CO2 22 22 - 32 mmol/L  Glucose, Bld 245 (H) 70 - 99 mg/dL   BUN 12 6 - 20 mg/dL   Creatinine, Ser  3.26 0.44 - 1.00 mg/dL   Calcium 8.5 (L) 8.9 - 10.3 mg/dL   Total Protein 6.5 6.5 - 8.1 g/dL   Albumin 2.9 (L) 3.5 - 5.0 g/dL   AST 15 15 - 41 U/L   ALT 17 0 - 44 U/L   Alkaline Phosphatase 72 38 - 126 U/L   Total Bilirubin 0.4 0.3 - 1.2 mg/dL   GFR calc non Af Amer >60 >60 mL/min   GFR calc Af Amer >60 >60 mL/min   Anion gap 8 5 - 15   MAU Course  Procedures Fioricet  MDM Labs ordered and reviewed. Hyperglycemia noted, pt due for Insulin. HA and blurry vision improved after med. No evidence of PEC. Stable for discharge home with close f/u.   Assessment and Plan   1. [redacted] weeks gestation of pregnancy   2. NST (non-stress test) reactive   3. Gestational hypertension, third trimester    Discharge home Follow up at [facility] in 2 days PEC precautions  Allergies as of 06/30/2018      Reactions   Enalapril Other (See Comments)   "Weak and blacked out" couldn't stand up. Pt states it dropped her BP too low    Morphine And Related Rash      Medication List    TAKE these medications   acetaminophen 325 MG tablet Commonly known as:  TYLENOL Take 650 mg by mouth every 6 (six) hours as needed for moderate pain.   aspirin 81 MG chewable tablet Chew 81 mg by mouth at bedtime.   insulin detemir 100 UNIT/ML injection Commonly known as:  LEVEMIR Inject 120 Units into the skin at bedtime.   prenatal multivitamin Tabs tablet Take 1 tablet by mouth at bedtime.     ASK your doctor about these medications   NOVOLOG 100 UNIT/ML injection Generic drug:  insulin aspart inject 100 units subcutaneously daily        Donette Larry, CNM 06/30/2018, 6:17 PM

## 2018-06-30 NOTE — MAU Note (Signed)
Pt sent from office for BP evaluation.  Reports BP elevated @ MD visit.  Reports H/A unrelieved with Tylenol.  States visual disturbances in right eye only, denies epigastric pain.  Denies LOF and VB.  Reports +FM.

## 2018-06-30 NOTE — Progress Notes (Signed)
FHR not tracing well from [phone], tracing sketchy @ times & tracing maternal pulse

## 2018-06-30 NOTE — Discharge Instructions (Signed)

## 2018-07-02 ENCOUNTER — Other Ambulatory Visit: Payer: Self-pay

## 2018-07-02 ENCOUNTER — Inpatient Hospital Stay (HOSPITAL_BASED_OUTPATIENT_CLINIC_OR_DEPARTMENT_OTHER): Payer: BC Managed Care – PPO

## 2018-07-02 ENCOUNTER — Inpatient Hospital Stay (HOSPITAL_COMMUNITY)
Admission: AD | Admit: 2018-07-02 | Discharge: 2018-07-02 | Disposition: A | Discharge status: Home / Self Care | Payer: BC Managed Care – PPO | Source: Ambulatory Visit | Attending: Obstetrics and Gynecology | Admitting: Obstetrics and Gynecology

## 2018-07-02 DIAGNOSIS — Z3A32 32 weeks gestation of pregnancy: Secondary | ICD-10-CM

## 2018-07-02 DIAGNOSIS — O368330 Maternal care for abnormalities of the fetal heart rate or rhythm, third trimester, not applicable or unspecified: Secondary | ICD-10-CM | POA: Diagnosis not present

## 2018-07-02 DIAGNOSIS — O24013 Pre-existing diabetes mellitus, type 1, in pregnancy, third trimester: Secondary | ICD-10-CM | POA: Diagnosis not present

## 2018-07-02 DIAGNOSIS — O24012 Pre-existing diabetes mellitus, type 1, in pregnancy, second trimester: Secondary | ICD-10-CM | POA: Diagnosis not present

## 2018-07-02 DIAGNOSIS — O24919 Unspecified diabetes mellitus in pregnancy, unspecified trimester: Secondary | ICD-10-CM

## 2018-07-02 DIAGNOSIS — O34219 Maternal care for unspecified type scar from previous cesarean delivery: Secondary | ICD-10-CM | POA: Insufficient documentation

## 2018-07-02 DIAGNOSIS — O99213 Obesity complicating pregnancy, third trimester: Secondary | ICD-10-CM | POA: Insufficient documentation

## 2018-07-02 DIAGNOSIS — O24313 Unspecified pre-existing diabetes mellitus in pregnancy, third trimester: Secondary | ICD-10-CM | POA: Diagnosis present

## 2018-07-02 DIAGNOSIS — O368390 Maternal care for abnormalities of the fetal heart rate or rhythm, unspecified trimester, not applicable or unspecified: Secondary | ICD-10-CM | POA: Diagnosis not present

## 2018-07-02 NOTE — MAU Note (Signed)
In office today with decels on NST, sent over for BPP and monitoring

## 2018-07-02 NOTE — ED Provider Notes (Signed)
Pt is a 22 y/o white female G2P1001 at [redacted] wks EGA , She is a insulin dept diabetic who entered medical care late in her preg. She has had poor control of her blood sugars. She was seen in the office for a NST. During the NST she had several accels and one decel to the 90s for 2 minutes immediately after an accel. She was sent to the ER for extended monitoring and a BPP. The BPP was nl and she had a reactive NST without decels. She was sent home to continue [facility]. She is scheduled to be seen in the office on Tuesday. Will continue to check BS. She will call endocrinologist today.

## 2018-07-02 NOTE — MAU Note (Signed)
Urine sent to lab 

## 2018-07-28 ENCOUNTER — Telehealth (HOSPITAL_COMMUNITY): Payer: Self-pay | Admitting: *Deleted

## 2018-07-28 NOTE — Telephone Encounter (Signed)
Preadmission screen  

## 2018-07-29 ENCOUNTER — Encounter (HOSPITAL_COMMUNITY): Payer: Self-pay

## 2018-08-05 ENCOUNTER — Encounter (HOSPITAL_COMMUNITY)
Admission: RE | Admit: 2018-08-05 | Discharge: 2018-08-05 | Disposition: A | Discharge status: Home / Self Care | Payer: BC Managed Care – PPO | Source: Ambulatory Visit | Attending: Obstetrics | Admitting: Obstetrics

## 2018-08-05 DIAGNOSIS — Z01812 Encounter for preprocedural laboratory examination: Secondary | ICD-10-CM | POA: Insufficient documentation

## 2018-08-05 LAB — TYPE AND SCREEN
ABO/RH(D): A POS
Antibody Screen: NEGATIVE

## 2018-08-05 LAB — BASIC METABOLIC PANEL
Anion gap: 8 (ref 5–15)
BUN: 13 mg/dL (ref 6–20)
CO2: 18 mmol/L — ABNORMAL LOW (ref 22–32)
Calcium: 8 mg/dL — ABNORMAL LOW (ref 8.9–10.3)
Chloride: 108 mmol/L (ref 98–111)
Creatinine, Ser: 0.38 mg/dL — ABNORMAL LOW (ref 0.44–1.00)
GFR calc Af Amer: >60 mL/min (ref >60)
GFR calc non Af Amer: >60 mL/min (ref >60)
Glucose, Bld: 90 mg/dL (ref 70–99)
Potassium: 3.6 mmol/L (ref 3.5–5.1)
Sodium: 134 mmol/L — ABNORMAL LOW (ref 135–145)

## 2018-08-05 LAB — CBC
HCT: 37.7 % (ref 36.0–46.0)
Hemoglobin: 12 g/dL (ref 12.0–15.0)
MCH: 26 pg (ref 26.0–34.0)
MCHC: 31.8 g/dL (ref 30.0–36.0)
MCV: 81.6 fL (ref 80.0–100.0)
Platelets: 274 10*3/uL (ref 150–400)
RBC: 4.62 MIL/uL (ref 3.87–5.11)
RDW: 13.5 % (ref 11.5–15.5)
WBC: 10.7 10*3/uL — ABNORMAL HIGH (ref 4.0–10.5)
nRBC: 0 % (ref 0.0–0.2)

## 2018-08-05 NOTE — Anesthesia Preprocedure Evaluation (Addendum)
Anesthesia Evaluation  Patient identified by MRN, date of birth, ID band Patient awake    Reviewed: Allergy & Precautions, NPO status , Patient's Chart, lab work & pertinent test results  History of Anesthesia Complications Negative for: history of anesthetic complications  Airway Mallampati: III  TM Distance: >3 FB Neck ROM: Full    Dental  (+) Dental Advisory Given   Pulmonary neg pulmonary ROS,    breath sounds clear to auscultation       Cardiovascular hypertension (PIH),  Rhythm:Regular Rate:Normal     Neuro/Psych negative neurological ROS     GI/Hepatic Neg liver ROS, GERD  Poorly Controlled,  Endo/Other  diabetes (glu 130)Morbid obesity  Renal/GU negative Renal ROS     Musculoskeletal   Abdominal (+) + obese,   Peds  Hematology plt 274k   Anesthesia Other Findings   Reproductive/Obstetrics                            Anesthesia Physical Anesthesia Plan  ASA: III  Anesthesia Plan: Spinal   Post-op Pain Management:    Induction:   PONV Risk Score and Plan: 2 and Ondansetron, Dexamethasone and Scopolamine patch - Pre-op  Airway Management Planned: Natural Airway  Additional Equipment:   Intra-op Plan:   Post-operative Plan:   Informed Consent: I have reviewed the patients History and Physical, chart, labs and discussed the procedure including the risks, benefits and alternatives for the proposed anesthesia with the patient or authorized representative who has indicated his/her understanding and acceptance.   Dental advisory given  Plan Discussed with: CRNA and Surgeon  Anesthesia Plan Comments:         Anesthesia Quick Evaluation

## 2018-08-05 NOTE — Patient Instructions (Signed)
Danielle Baker  08/05/2018   Your procedure is scheduled on:  08/06/2018  Enter through the Main Entrance of [facility] at 0530 AM.  Pick up the phone at the desk and dial [phone]  Call this number if you have problems the morning of surgery:[phone]  Remember:   Do not eat food:(After Midnight) Desps de medianoche.  Do not drink clear liquids: (After Midnight) Desps de medianoche.  Take these medicines the morning of surgery with A SIP OF WATER: take 96 units of levamir the night before surgery.  No insulin or other medications the morning of surgery   Do not wear jewelry, make-up or nail polish.  Do not wear lotions, powders, or perfumes. Do not wear deodorant.  Do not shave 48 hours prior to surgery.  Do not bring valuables to the hospital.  [facility] is not   responsible for any belongings or valuables brought to the hospital.  Contacts, dentures or bridgework may not be worn into surgery.  Leave suitcase in the car. After surgery it may be brought to your room.  For patients admitted to the hospital, checkout time is 11:00 AM the day of              discharge.    N/A   Please read over the following fact sheets that you were given:   Surgical Site Infection Prevention

## 2018-08-06 ENCOUNTER — Inpatient Hospital Stay (HOSPITAL_COMMUNITY)
Admission: RE | Admit: 2018-08-06 | Discharge: 2018-08-08 | DRG: 786 | Disposition: A | Discharge status: Home / Self Care | Payer: BC Managed Care – PPO | Attending: Obstetrics | Admitting: Obstetrics

## 2018-08-06 ENCOUNTER — Other Ambulatory Visit: Payer: Self-pay

## 2018-08-06 ENCOUNTER — Encounter (HOSPITAL_COMMUNITY)
Admission: RE | Disposition: A | Discharge status: Home / Self Care | Payer: Self-pay | Source: Home / Self Care | Attending: Obstetrics

## 2018-08-06 ENCOUNTER — Inpatient Hospital Stay (HOSPITAL_COMMUNITY): Payer: BC Managed Care – PPO | Admitting: Anesthesiology

## 2018-08-06 ENCOUNTER — Encounter (HOSPITAL_COMMUNITY): Payer: Self-pay | Admitting: *Deleted

## 2018-08-06 DIAGNOSIS — E109 Type 1 diabetes mellitus without complications: Secondary | ICD-10-CM | POA: Diagnosis present

## 2018-08-06 DIAGNOSIS — Z794 Long term (current) use of insulin: Secondary | ICD-10-CM

## 2018-08-06 DIAGNOSIS — O2402 Pre-existing diabetes mellitus, type 1, in childbirth: Secondary | ICD-10-CM | POA: Diagnosis present

## 2018-08-06 DIAGNOSIS — Z3A37 37 weeks gestation of pregnancy: Secondary | ICD-10-CM | POA: Diagnosis not present

## 2018-08-06 DIAGNOSIS — Z98891 History of uterine scar from previous surgery: Secondary | ICD-10-CM

## 2018-08-06 DIAGNOSIS — O134 Gestational [pregnancy-induced] hypertension without significant proteinuria, complicating childbirth: Secondary | ICD-10-CM | POA: Diagnosis present

## 2018-08-06 DIAGNOSIS — O34211 Maternal care for low transverse scar from previous cesarean delivery: Secondary | ICD-10-CM | POA: Diagnosis present

## 2018-08-06 DIAGNOSIS — O99214 Obesity complicating childbirth: Secondary | ICD-10-CM | POA: Diagnosis present

## 2018-08-06 LAB — GLUCOSE, CAPILLARY
Glucose-Capillary: 130 mg/dL — ABNORMAL HIGH (ref 70–99)
Glucose-Capillary: 151 mg/dL — ABNORMAL HIGH (ref 70–99)
Glucose-Capillary: 150 mg/dL — ABNORMAL HIGH (ref 70–99)
Glucose-Capillary: 175 mg/dL — ABNORMAL HIGH (ref 70–99)
Glucose-Capillary: 214 mg/dL — ABNORMAL HIGH (ref 70–99)

## 2018-08-06 LAB — RPR: RPR Ser Ql: NONREACTIVE

## 2018-08-06 SURGERY — Surgical Case
Anesthesia: Spinal

## 2018-08-06 MED ORDER — SIMETHICONE 80 MG PO CHEW
80.0000 mg | CHEWABLE_TABLET | Freq: Three times a day (TID) | ORAL | Status: DC
  Administered 2018-08-06 – 2018-08-08 (×7): 80 mg via ORAL
  Filled 2018-08-06 (×13): qty 1

## 2018-08-06 MED ORDER — ONDANSETRON HCL 4 MG/2ML IJ SOLN
INTRAMUSCULAR | Status: AC
  Filled 2018-08-06: qty 2

## 2018-08-06 MED ORDER — MORPHINE SULFATE (PF) 0.5 MG/ML IJ SOLN
INTRAMUSCULAR | Status: AC
  Filled 2018-08-06: qty 10

## 2018-08-06 MED ORDER — SODIUM CHLORIDE 0.9 % IR SOLN
Status: DC | PRN
  Administered 2018-08-06: 1

## 2018-08-06 MED ORDER — SOD CITRATE-CITRIC ACID 500-334 MG/5ML PO SOLN: 30.0000 mL | Freq: Once | ORAL | Status: DC

## 2018-08-06 MED ORDER — PHENYLEPHRINE 8 MG IN D5W 100 ML (0.08MG/ML) PREMIX OPTIME
INJECTION | INTRAVENOUS | Status: AC
  Filled 2018-08-06: qty 100

## 2018-08-06 MED ORDER — OXYTOCIN 10 UNIT/ML IJ SOLN
INTRAVENOUS | Status: DC | PRN
  Administered 2018-08-06: 40 [IU] via INTRAVENOUS

## 2018-08-06 MED ORDER — ONDANSETRON HCL 4 MG/2ML IJ SOLN
INTRAMUSCULAR | Status: DC | PRN
  Administered 2018-08-06: 4 mg via INTRAVENOUS

## 2018-08-06 MED ORDER — OXYTOCIN 40 UNITS IN LACTATED RINGERS INFUSION - SIMPLE MED: 2.5000 [IU]/h | INTRAVENOUS | Status: AC

## 2018-08-06 MED ORDER — SIMETHICONE 80 MG PO CHEW
80.0000 mg | CHEWABLE_TABLET | ORAL | Status: DC
  Administered 2018-08-06: 80 mg via ORAL
  Filled 2018-08-06 (×4): qty 1

## 2018-08-06 MED ORDER — INSULIN DETEMIR 100 UNIT/ML ~~LOC~~ SOLN
60.0000 [IU] | Freq: Every day | SUBCUTANEOUS | Status: DC
  Administered 2018-08-06 – 2018-08-07 (×2): 60 [IU] via SUBCUTANEOUS
  Filled 2018-08-06 (×3): qty 0.6

## 2018-08-06 MED ORDER — IBUPROFEN 800 MG PO TABS
800.0000 mg | ORAL_TABLET | Freq: Four times a day (QID) | ORAL | Status: DC
  Administered 2018-08-07 – 2018-08-08 (×4): 800 mg via ORAL
  Filled 2018-08-06 (×4): qty 1

## 2018-08-06 MED ORDER — LACTATED RINGERS IV SOLN
INTRAVENOUS | Status: DC | PRN
  Administered 2018-08-06 (×2): via INTRAVENOUS

## 2018-08-06 MED ORDER — MIDAZOLAM HCL 2 MG/2ML IJ SOLN: 0.5000 mg | Freq: Once | INTRAMUSCULAR | Status: DC | PRN

## 2018-08-06 MED ORDER — KETOROLAC TROMETHAMINE 30 MG/ML IJ SOLN
30.0000 mg | Freq: Four times a day (QID) | INTRAMUSCULAR | Status: AC
  Administered 2018-08-06 – 2018-08-07 (×4): 30 mg via INTRAVENOUS
  Filled 2018-08-06 (×5): qty 1

## 2018-08-06 MED ORDER — ACETAMINOPHEN 500 MG PO TABS
1000.0000 mg | ORAL_TABLET | Freq: Four times a day (QID) | ORAL | Status: DC
  Administered 2018-08-06 – 2018-08-08 (×8): 1000 mg via ORAL
  Filled 2018-08-06 (×9): qty 2

## 2018-08-06 MED ORDER — KETOROLAC TROMETHAMINE 30 MG/ML IJ SOLN: 30.0000 mg | Freq: Once | INTRAMUSCULAR | Status: DC | PRN

## 2018-08-06 MED ORDER — PRENATAL MULTIVITAMIN CH
1.0000 | ORAL_TABLET | Freq: Every day | ORAL | Status: DC
  Administered 2018-08-07 – 2018-08-08 (×2): 1 via ORAL
  Filled 2018-08-06 (×5): qty 1

## 2018-08-06 MED ORDER — HYDROMORPHONE HCL 1 MG/ML IJ SOLN: 0.5000 mg | INTRAMUSCULAR | Status: DC | PRN

## 2018-08-06 MED ORDER — BUPIVACAINE IN DEXTROSE 0.75-8.25 % IT SOLN
INTRATHECAL | Status: DC | PRN
  Administered 2018-08-06: 12 mg via INTRATHECAL

## 2018-08-06 MED ORDER — PHENYLEPHRINE 8 MG IN D5W 100 ML (0.08MG/ML) PREMIX OPTIME
INJECTION | INTRAVENOUS | Status: DC | PRN
  Administered 2018-08-06: 60 ug/min via INTRAVENOUS

## 2018-08-06 MED ORDER — SCOPOLAMINE 1 MG/3DAYS TD PT72
MEDICATED_PATCH | TRANSDERMAL | Status: AC
  Filled 2018-08-06: qty 1

## 2018-08-06 MED ORDER — OXYCODONE HCL 5 MG PO TABS
5.0000 mg | ORAL_TABLET | ORAL | Status: DC | PRN
  Administered 2018-08-06 – 2018-08-07 (×8): 10 mg via ORAL
  Administered 2018-08-08 (×2): 5 mg via ORAL
  Filled 2018-08-06: qty 2
  Filled 2018-08-06: qty 1
  Filled 2018-08-06 (×7): qty 2
  Filled 2018-08-06: qty 1

## 2018-08-06 MED ORDER — OXYTOCIN 10 UNIT/ML IJ SOLN
INTRAMUSCULAR | Status: AC
  Filled 2018-08-06: qty 4

## 2018-08-06 MED ORDER — INSULIN ASPART 100 UNIT/ML ~~LOC~~ SOLN
5.0000 [IU] | Freq: Three times a day (TID) | SUBCUTANEOUS | Status: DC
  Administered 2018-08-06 – 2018-08-08 (×4): 5 [IU] via SUBCUTANEOUS
  Filled 2018-08-06 (×6): qty 1

## 2018-08-06 MED ORDER — LACTATED RINGERS IV SOLN
INTRAVENOUS | Status: DC
  Administered 2018-08-06: 16:00:00 via INTRAVENOUS

## 2018-08-06 MED ORDER — MORPHINE SULFATE (PF) 4 MG/ML IV SOLN: 1.0000 mg | INTRAVENOUS | Status: DC | PRN

## 2018-08-06 MED ORDER — TETANUS-DIPHTH-ACELL PERTUSSIS 5-2.5-18.5 LF-MCG/0.5 IM SUSP
0.5000 mL | Freq: Once | INTRAMUSCULAR | Status: DC
  Filled 2018-08-06: qty 0.5

## 2018-08-06 MED ORDER — COCONUT OIL OIL
1.0000 "application " | TOPICAL_OIL | Status: DC | PRN
  Filled 2018-08-06: qty 120

## 2018-08-06 MED ORDER — MENTHOL 3 MG MT LOZG
1.0000 | LOZENGE | OROMUCOSAL | Status: DC | PRN
  Filled 2018-08-06: qty 9

## 2018-08-06 MED ORDER — SCOPOLAMINE 1 MG/3DAYS TD PT72: 1.0000 | MEDICATED_PATCH | Freq: Once | TRANSDERMAL | Status: DC

## 2018-08-06 MED ORDER — SCOPOLAMINE 1 MG/3DAYS TD PT72
MEDICATED_PATCH | TRANSDERMAL | Status: DC | PRN
  Administered 2018-08-06: 1 via TRANSDERMAL

## 2018-08-06 MED ORDER — DEXTROSE 5 % IV SOLN
3.0000 g | INTRAVENOUS | Status: AC
  Administered 2018-08-06: 3 g via INTRAVENOUS
  Filled 2018-08-06: qty 3

## 2018-08-06 MED ORDER — FENTANYL CITRATE (PF) 100 MCG/2ML IJ SOLN
INTRAMUSCULAR | Status: DC | PRN
  Administered 2018-08-06: 15 ug via INTRATHECAL

## 2018-08-06 MED ORDER — SENNOSIDES-DOCUSATE SODIUM 8.6-50 MG PO TABS
2.0000 | ORAL_TABLET | ORAL | Status: DC
  Administered 2018-08-06 – 2018-08-08 (×2): 2 via ORAL
  Filled 2018-08-06 (×4): qty 2

## 2018-08-06 MED ORDER — PROMETHAZINE HCL 25 MG/ML IJ SOLN: 6.2500 mg | INTRAMUSCULAR | Status: DC | PRN

## 2018-08-06 MED ORDER — MEPERIDINE HCL 25 MG/ML IJ SOLN: 6.2500 mg | INTRAMUSCULAR | Status: DC | PRN

## 2018-08-06 MED ORDER — SIMETHICONE 80 MG PO CHEW
80.0000 mg | CHEWABLE_TABLET | ORAL | Status: DC | PRN
  Filled 2018-08-06: qty 1

## 2018-08-06 MED ORDER — ENOXAPARIN SODIUM 60 MG/0.6ML ~~LOC~~ SOLN
60.0000 mg | SUBCUTANEOUS | Status: DC
  Administered 2018-08-06 – 2018-08-08 (×2): 60 mg via SUBCUTANEOUS
  Filled 2018-08-06 (×4): qty 0.6

## 2018-08-06 MED ORDER — DIBUCAINE 1 % RE OINT
1.0000 "application " | TOPICAL_OINTMENT | RECTAL | Status: DC | PRN
  Filled 2018-08-06: qty 28

## 2018-08-06 MED ORDER — DIPHENHYDRAMINE HCL 25 MG PO CAPS
25.0000 mg | ORAL_CAPSULE | Freq: Four times a day (QID) | ORAL | Status: DC | PRN
  Filled 2018-08-06: qty 1

## 2018-08-06 MED ORDER — LACTATED RINGERS IV SOLN
INTRAVENOUS | Status: DC | PRN
  Administered 2018-08-06: 08:00:00 via INTRAVENOUS

## 2018-08-06 MED ORDER — NALBUPHINE HCL 10 MG/ML IJ SOLN: 5.0000 mg | INTRAMUSCULAR | Status: DC | PRN

## 2018-08-06 MED ORDER — FENTANYL CITRATE (PF) 100 MCG/2ML IJ SOLN
INTRAMUSCULAR | Status: AC
  Filled 2018-08-06: qty 2

## 2018-08-06 MED ORDER — WITCH HAZEL-GLYCERIN EX PADS: 1.0000 "application " | MEDICATED_PAD | CUTANEOUS | Status: DC | PRN

## 2018-08-06 SURGICAL SUPPLY — 41 items
APL SKNCLS STERI-STRIP NONHPOA (GAUZE/BANDAGES/DRESSINGS) ×1
BENZOIN TINCTURE PRP APPL 2/3 (GAUZE/BANDAGES/DRESSINGS) ×2 IMPLANT
CHLORAPREP W/TINT 26ML (MISCELLANEOUS) ×2 IMPLANT
CLAMP CORD UMBIL (MISCELLANEOUS) IMPLANT
CLOTH BEACON ORANGE TIMEOUT ST (SAFETY) ×2 IMPLANT
DRSG OPSITE POSTOP 4X10 (GAUZE/BANDAGES/DRESSINGS) ×2 IMPLANT
ELECT REM PT RETURN 9FT ADLT (ELECTROSURGICAL) ×2
ELECTRODE REM PT RTRN 9FT ADLT (ELECTROSURGICAL) ×1 IMPLANT
EXTRACTOR VACUUM KIWI (MISCELLANEOUS) IMPLANT
GLOVE BIOGEL PI IND STRL 6.5 (GLOVE) ×1 IMPLANT
GLOVE BIOGEL PI IND STRL 7.0 (GLOVE) ×1 IMPLANT
GLOVE BIOGEL PI INDICATOR 6.5 (GLOVE) ×1
GLOVE BIOGEL PI INDICATOR 7.0 (GLOVE) ×1
GLOVE ECLIPSE 6.0 STRL STRAW (GLOVE) ×2 IMPLANT
GOWN STRL REUS W/TWL LRG LVL3 (GOWN DISPOSABLE) ×4 IMPLANT
HOVERMATT SINGLE USE (MISCELLANEOUS) ×1 IMPLANT
KIT ABG SYR 3ML LUER SLIP (SYRINGE) IMPLANT
NDL HYPO 25X5/8 SAFETYGLIDE (NEEDLE) IMPLANT
NEEDLE HYPO 25X5/8 SAFETYGLIDE (NEEDLE) IMPLANT
NS IRRIG 1000ML POUR BTL (IV SOLUTION) ×2 IMPLANT
PACK C SECTION WH (CUSTOM PROCEDURE TRAY) ×2 IMPLANT
PAD OB MATERNITY 4.3X12.25 (PERSONAL CARE ITEMS) ×2 IMPLANT
PENCIL SMOKE EVAC W/HOLSTER (ELECTROSURGICAL) ×2 IMPLANT
RETRACTOR TRAXI PANNICULUS (MISCELLANEOUS) IMPLANT
RTRCTR C-SECT PINK 25CM LRG (MISCELLANEOUS) ×2 IMPLANT
SPONGE LAP 18X18 RF (DISPOSABLE) ×6 IMPLANT
STRIP CLOSURE SKIN 1/2X4 (GAUZE/BANDAGES/DRESSINGS) ×2 IMPLANT
SUT MNCRL 0 VIOLET CTX 36 (SUTURE) ×2 IMPLANT
SUT MNCRL AB 3-0 PS2 27 (SUTURE) ×3 IMPLANT
SUT MONOCRYL 0 CTX 36 (SUTURE) ×2
SUT PDS AB 0 CTX 60 (SUTURE) ×3 IMPLANT
SUT PLAIN 0 NONE (SUTURE) IMPLANT
SUT PLAIN 2 0 (SUTURE) ×2
SUT PLAIN ABS 2-0 CT1 27XMFL (SUTURE) ×1 IMPLANT
SUT VIC AB 0 CTX 36 (SUTURE) ×4
SUT VIC AB 0 CTX36XBRD ANBCTRL (SUTURE) ×2 IMPLANT
SUT VIC AB 2-0 CT1 27 (SUTURE) ×2
SUT VIC AB 2-0 CT1 TAPERPNT 27 (SUTURE) ×1 IMPLANT
TOWEL OR 17X24 6PK STRL BLUE (TOWEL DISPOSABLE) ×2 IMPLANT
TRAXI PANNICULUS RETRACTOR (MISCELLANEOUS) ×1
TRAY FOLEY W/BAG SLVR 14FR LF (SET/KITS/TRAYS/PACK) ×2 IMPLANT

## 2018-08-06 NOTE — Anesthesia Postprocedure Evaluation (Signed)
Anesthesia Post Note  Patient: BREAWNA STROHSCHEIN  Procedure(s) Performed: CESAREAN SECTION (N/A )     Patient location during evaluation: PACU Anesthesia Type: Spinal Level of consciousness: awake and alert, patient cooperative and oriented Pain management: pain level controlled Vital Signs Assessment: post-procedure vital signs reviewed and stable Respiratory status: spontaneous breathing, nonlabored ventilation and respiratory function stable Cardiovascular status: blood pressure returned to baseline and stable Postop Assessment: spinal receding, patient able to bend at knees and no apparent nausea or vomiting Anesthetic complications: no    Last Vitals:  Vitals:   08/06/18 0915 08/06/18 0930  BP: (!) 96/43 104/62  Pulse: 91 97  Resp: (!) 22 20  Temp:  36.6 C  SpO2: 99% 99%    Last Pain:  Vitals:   08/06/18 0930  TempSrc: Oral  PainSc: 0-No pain   Pain Goal:                 Kista Robb,E. Evie Croston

## 2018-08-06 NOTE — Transfer of Care (Signed)
Immediate Anesthesia Transfer of Care Note  Patient: Danielle Baker  Procedure(s) Performed: CESAREAN SECTION (N/A )  Patient Location: PACU  Anesthesia Type:Spinal  Level of Consciousness: awake  Airway & Oxygen Therapy: Patient Spontanous Breathing  Post-op Assessment: Report given to RN and Post -op Vital signs reviewed and stable  Post vital signs: stable  Last Vitals:  Vitals Value Taken Time  BP 106/40 08/06/2018  8:55 AM  Temp    Pulse 99 08/06/2018  8:58 AM  Resp 27 08/06/2018  8:58 AM  SpO2 98 % 08/06/2018  8:58 AM  Vitals shown include unvalidated device data.  Last Pain:  Vitals:   08/06/18 0547  TempSrc: Oral  PainSc: 0-No pain         Complications: No apparent anesthesia complications

## 2018-08-06 NOTE — H&P (Signed)
22 y.o. G2P1001 @ 3648w0d presents for repeat cesarean section for gestational hypertension at term.  Otherwise has good fetal movement and no bleeding.  Pregnancy c/b: 1. Type 1 DM: Followed by endocrine.  Current insulin regimen: Levamir 120U qhs, Novolog carb ratio of 1:3.  At start of pregnancy, carb ratio was 1:5 and Levamir was 60U.  Normal anatomy scan by MFM and fetal ECHO this pregnancy.  Last EFW (12/11) 3460gm (7#10) >90% 2. Gestational hypertension.  Diagnosed in 3rd trimester.  Is asymptomatic today.  Was on ASA 81mg  this pregnancy 3.  History of cesarean section: with G1 for suspected macrosomia as recommended by MFM.  She desires elective repeat.   Past Medical History:  Diagnosis Date  . Heart murmur    as a child  . Hypoglycemia associated with diabetes (HCC)   . Obesity, morbid (HCC)   . Pancreatitis   . Tachycardia   . Type 1 diabetes mellitus not at goal [facility](HCC)    since age of 22 years old    Past Surgical History:  Procedure Laterality Date  . CESAREAN SECTION N/A 03/17/2016   Procedure: CESAREAN SECTION;  Surgeon: Marlow Baarsyanna Vedant Shehadeh, MD;  [facility];  Service: Obstetrics;  Laterality: N/A;  REQUEST RNFA  . CHOLECYSTECTOMY      OB History  Gravida Para Term Preterm AB Living  2 1 1     1   SAB TAB Ectopic Multiple Live Births        0 1    # Outcome Date GA Lbr Len/2nd Weight Sex Delivery Anes PTL Lv  2 Current           1 Term 03/17/16 8639w1d  4270 g F CS-LTranv Spinal  LIV    Social History   Socioeconomic History  . Marital status: Married    Spouse name: Not on file  . Number of children: Not on file  . Years of education: Not on file  . Highest education level: Not on file  Occupational History  . Not on file  Tobacco Use  . Smoking status: Never Smoker  . Smokeless tobacco: Never Used  Substance and Sexual Activity  . Alcohol use: No    Alcohol/week: 0.0 standard drinks  . Drug use: No  . Sexual activity: Yes    Birth  control/protection: None   Enalapril and Morphine and related    Prenatal Transfer Tool  Maternal Diabetes: Yes:  Diabetes Type:  Pre-pregnancy Genetic Screening: Normal Maternal Ultrasounds/Referrals: Normal Fetal Ultrasounds or other Referrals:  None Maternal Substance Abuse:  No Significant Maternal Medications:  Meds include: Other:  Insulin Significant Maternal Lab Results: None  ABO, Rh: --/--/A POS (12/19 0945) Antibody: NEG (12/19 0945) Rubella: Immune (07/03 0000) RPR: Non Reactive (12/19 0945)  HBsAg: Negative (07/03 0000)  HIV: Non-reactive (07/03 0000)  GBS:   Positive by urine   Vitals:   08/06/18 0547  BP: (!) 151/86  Pulse: (!) 104  Resp: 20  Temp: 98.2 F (36.8 C)  SpO2: 99%     General:  NAD Abdomen:  soft, gravid Ex:  1+ edema FHTs:  155    A/P   22 y.o. G2P1001 3748w0d presents for repeat cesarean section.  Discussed risks of cesarean section to include, but not limited to, infection, bleeding, damage to surrounding strutcures (including bowel, bladder, tubes, ovaries, nerves, vessels, baby), need for additional procedures, risk of blood clot, need for transfusion. Consent signed CMP not drawn with preop labs--will add on for tomorrow  AM Ancef 3gm on call to OR  [facility] GEFFEL Chestine Spore

## 2018-08-06 NOTE — Anesthesia Procedure Notes (Signed)
Spinal  Patient location during procedure: OR End time: 08/06/2018 7:32 AM Staffing Anesthesiologist: Jairo Ben, MD Performed: anesthesiologist  Preanesthetic Checklist Completed: patient identified, surgical consent, pre-op evaluation, timeout performed, IV checked, risks and benefits discussed and monitors and equipment checked Spinal Block Patient position: sitting Prep: site prepped and draped and DuraPrep Patient monitoring: blood pressure, continuous pulse ox, cardiac monitor and heart rate Approach: midline Location: L3-4 Injection technique: single-shot Needle Needle type: Pencan  Needle gauge: 24 G Needle length: 9 cm Assessment Sensory level: T4 Additional Notes Pt identified in Operating room.  Monitors applied. Working IV access confirmed. Sterile prep, drape lumbar spine.  1% lido local L 3,4.  #24ga Pencan into clear CSF L 3,4.  12mg  0.75% Bupivacaine with dextrose, fentanyl injected with asp CSF beginning and end of injection.  Patient asymptomatic, VSS, no heme aspirated, tolerated well.  Sandford Craze, MD

## 2018-08-06 NOTE — Progress Notes (Signed)
CBG taken in PACU 151mg /dL

## 2018-08-06 NOTE — Addendum Note (Signed)
Addendum  created 08/06/18 1123 by Renford Dills, CRNA   Intraprocedure Flowsheets edited

## 2018-08-06 NOTE — Addendum Note (Signed)
Addendum  created 08/06/18 1441 by Graciela Husbands, CRNA   Clinical Note Signed

## 2018-08-06 NOTE — Anesthesia Postprocedure Evaluation (Signed)
Anesthesia Post Note  Patient: Danielle Baker  Procedure(s) Performed: CESAREAN SECTION (N/A )     Patient location during evaluation: Mother Baby Anesthesia Type: Spinal Level of consciousness: awake and alert and oriented Pain management: satisfactory to patient Vital Signs Assessment: post-procedure vital signs reviewed and stable Respiratory status: respiratory function stable and spontaneous breathing Cardiovascular status: blood pressure returned to baseline Postop Assessment: no headache, no backache, spinal receding, patient able to bend at knees and adequate PO intake Anesthetic complications: no    Last Vitals:  Vitals:   08/06/18 1215 08/06/18 1350  BP: (!) 149/100   Pulse: (!) 107   Resp: (!) 22 (P) 18  Temp: 36.6 C (P) 36.7 C  SpO2: 99% (P) 98%    Last Pain:  Vitals:   08/06/18 1350  TempSrc: (P) Oral  PainSc:    Pain Goal:                 [name]

## 2018-08-07 LAB — CBC
HCT: 32.5 % — ABNORMAL LOW (ref 36.0–46.0)
Hemoglobin: 10.2 g/dL — ABNORMAL LOW (ref 12.0–15.0)
MCH: 25.6 pg — ABNORMAL LOW (ref 26.0–34.0)
MCHC: 31.4 g/dL (ref 30.0–36.0)
MCV: 81.7 fL (ref 80.0–100.0)
Platelets: 252 10*3/uL (ref 150–400)
RBC: 3.98 MIL/uL (ref 3.87–5.11)
RDW: 13.6 % (ref 11.5–15.5)
WBC: 11 10*3/uL — ABNORMAL HIGH (ref 4.0–10.5)
nRBC: 0 % (ref 0.0–0.2)

## 2018-08-07 LAB — GLUCOSE, CAPILLARY
Glucose-Capillary: 130 mg/dL — ABNORMAL HIGH (ref 70–99)
Glucose-Capillary: 58 mg/dL — ABNORMAL LOW (ref 70–99)
Glucose-Capillary: 49 mg/dL — ABNORMAL LOW (ref 70–99)
Glucose-Capillary: 55 mg/dL — ABNORMAL LOW (ref 70–99)
Glucose-Capillary: 66 mg/dL — ABNORMAL LOW (ref 70–99)
Glucose-Capillary: 80 mg/dL (ref 70–99)
Glucose-Capillary: 207 mg/dL — ABNORMAL HIGH (ref 70–99)

## 2018-08-07 LAB — BIRTH TISSUE RECOVERY COLLECTION (PLACENTA DONATION)

## 2018-08-07 NOTE — Progress Notes (Signed)
Hypoglycemic Event  CBG: 49  Treatment: 8 oz of juice given. Recheck CBG in 15 mins  Symptoms: none  Follow-up CBG: Time:1428 CBG Result: 55  Treatment: 4 oz of juice given. Recheck in 15 mins  Follow-up CBG: Time:1450     CBG Result: 66  Treatment: 4 oz of juice given. Recheck in 15 mins  Follow-up CBG: Time: 1513      CBG Result: 80  Possible Reasons for Event: Patient stated that "the day after delivery with first baby she had a day of low blood sugars"  Comments/MD notified: Dr. Claiborne Billings alerted  Will continue to monitor and assess for signs of hypoglycemia  Mailin Coglianese L Vonette Grosso

## 2018-08-07 NOTE — Progress Notes (Signed)
Patient is eating, ambulating, voiding.  Pain control is good.  Appropriate lochia, no complaints.  Vitals:   08/06/18 1723 08/06/18 2115 08/07/18 0120 08/07/18 0520  BP: (!) 106/51 101/61 110/67 122/65  Pulse: 82 86 81 75  Resp: 18 16 16 17   Temp: 98 F (36.7 C) 97.7 F (36.5 C) 97.8 F (36.6 C) 97.7 F (36.5 C)  TempSrc: Oral Oral Oral Oral  SpO2: 98% 97% 98% 99%  Weight:      Height:        Fundus firm Inc: minimal drainage on dressing, otherwise c/d/i Ext: no calf tenderness  Lab Results  Component Value Date   WBC 11.0 (H) 08/07/2018   HGB 10.2 (L) 08/07/2018   HCT 32.5 (L) 08/07/2018   MCV 81.7 08/07/2018   PLT 252 08/07/2018    --/--/A POS (12/19 0945)  A/P Post op day #1 s/p repeat c/s. DM type 1: insulin protocol with blood sugar coverage ordered by Dr. Chestine Spore.  Blood sugars ranging 130- 214.  Pt has f/u appt outpatient with endo next week.  Will continue current protocol. Cont. Lovenox prophylaxis until discharge New IV access placed, so IV toradol can be administered, allergy to motrin.  Pt very comfortable and up and moving today. Cont. Other routine care.   Philip Aspen

## 2018-08-08 LAB — GLUCOSE, CAPILLARY
Glucose-Capillary: 76 mg/dL (ref 70–99)
Glucose-Capillary: 96 mg/dL (ref 70–99)
Glucose-Capillary: 56 mg/dL — ABNORMAL LOW (ref 70–99)
Glucose-Capillary: 75 mg/dL (ref 70–99)

## 2018-08-08 MED ORDER — OXYCODONE HCL 5 MG PO TABS: 5.0000 mg | ORAL_TABLET | ORAL | 0 refills | Status: DC | PRN

## 2018-08-08 MED ORDER — INSULIN ASPART 100 UNIT/ML ~~LOC~~ SOLN: 5.0000 [IU] | Freq: Three times a day (TID) | SUBCUTANEOUS | 11 refills | Status: DC

## 2018-08-08 MED ORDER — INSULIN DETEMIR 100 UNIT/ML ~~LOC~~ SOLN: 60.0000 [IU] | Freq: Every day | SUBCUTANEOUS | 11 refills | Status: DC

## 2018-08-08 NOTE — Discharge Summary (Signed)
Obstetric Discharge Summary Reason for Admission: cesarean section Prenatal Procedures: ultrasound Intrapartum Procedures: cesarean: low cervical, transverse Postpartum Procedures: none Complications-Operative and Postpartum: none Hemoglobin  Date Value Ref Range Status  08/07/2018 10.2 (L) 12.0 - 15.0 g/dL Final   HCT  Date Value Ref Range Status  08/07/2018 32.5 (L) 36.0 - 46.0 % Final    Physical Exam:  General: alert and cooperative Lochia: appropriate Uterine Fundus: firm Incision: healing well, no significant drainage, mild rash outside of dressing, no sign of infection DVT Evaluation: No evidence of DVT seen on physical exam.  Discharge Diagnoses: Term Pregnancy-delivered  Discharge Information: Date: 08/08/2018 Activity: pelvic rest Diet: routine Medications: PNV, Ibuprofen and Percocet Condition: stable Instructions: refer to practice specific booklet Discharge to: home Follow-up Information    Marlow Baars, MD Follow up in 2 week(s).   Specialty:  Obstetrics Contact information: [address] [place] [address] [phone]           Newborn Data: Live born female  Birth Weight: 9 lb 5.9 oz (4250 g) APGAR: 8, 9  Newborn Delivery   Birth date/time:  08/06/2018 08:01:00 Delivery type:  C-Section, Low Transverse Trial of labor:  No C-section categorization:  Repeat     Home with mother.  Philip Aspen 08/08/2018, 9:01 AM

## 2018-08-09 NOTE — Op Note (Signed)
Cesarean Section Procedure Note  Pre-operative Diagnosis: 1. Intrauterine pregnancy at [redacted]w[redacted]d  2. Gestational hypertension 3.  Type 1 diabetes  4. Morbid obesity  Post-operative Diagnosis: same as above  Surgeon: Marlow Baars, MD  Assistants: Philip Aspen, DO  Procedure: Repeat low transverse cesarean section   Anesthesia: Spinal anesthesia  Estimated Blood Loss: 490 mL              Complications:  None; patient tolerated the procedure well.         Disposition: PACU - hemodynamically stable.  Findings:  Normal uterus, tubes and ovaries bilaterally.  Viable female infant, 4250g (9lb 5.9oz) Apgars 8, 9.    Procedure Details   After spinal  anesthesia was found to adequate, the patient was placed in the dorsal supine position with a leftward tilt.  A traxi pannus retractor was placed on the abdomen, and the abdomen and vagina were prepped and draped in the usual sterile manner. A Pfannenstiel incision was made and carried down through the subcutaneous tissue to the fascia through the prior incision.  The fascia was incised in the midline and the fascial incision was extended laterally with Mayo scissors. The superior aspect of the fascial incision was grasped with two Kocher clamps, tented up and the rectus muscles dissected off sharply. The rectus was then dissected off with blunt dissection and Mayo scissors inferiorly. The rectus muscles were separated in the midline. The abdominal peritoneum was identified, tented up, entered sharply, and the incision was extended superiorly and inferiorly with good visualization of the bladder. The Alexis retractor was deployed. The vesicouterine peritoneum was identified, tented up, entered sharply, and the bladder flap was created digitally. A scalpel was then used to make a low transverse incision on the uterus which was extended in the cephalad-caudad direction with blunt dissection. The fluid was clear. The fetal vertex was identified, elevated out  of the pelvis and brought to the hysterotomy.  The head was delivered easily followed by the shoulders and body.  After a 60 second delay per protocol, the cord was clamped and cut and the infant was passed to the waiting neonatologist.  The placenta was then delivered spontaneously, intact and appear normal, the uterus was cleared of all clot and debris   The hysterotomy was repaired with #0 Monocryl in running locked fashion.  A second imbricating layer of #0 Monocryl was placed.  Excellent hemostasis was noted.  The Alexis retractor was removed from the abdomen. The peritoneum was examined and all vessels noted to be hemostatic. The abdominal cavity was cleared of all clot and debris.  The peritoneum was closed with 2-0 vicryl in a running fashion.  The fascia and rectus muscles were inspected and were hemostatic. The fascia was closed with #1 looped PDS in a running fashion. The subcutaneous layer was irrigated and all bleeders cauterized. The subcutaneous layer was closed with interrupted plain gut. The skin was closed with 3-0 monocryl in a subcuticular fashion. The incision was dressed with benzoine, steri strips and honeycomb dressing. All sponge lap and needle counts were correct x3. Patient tolerated the procedure well and recovered in stable condition following the procedure.

## 2019-09-12 MED FILL — ESCITALOPRAM 10 MG TABLET: 10 | 30 days supply | Qty: 30 | Fill #0

## 2019-10-06 MED FILL — HUMALOG 100 UNITS/ML KWIKPE: 100 | 30 days supply | Qty: 27 | Fill #0

## 2019-10-11 MED FILL — ESCITALOPRAM 20 MG TABLET: 20 | 30 days supply | Qty: 30 | Fill #0

## 2019-10-18 MED FILL — BLISOVI FE 1/20 1-20 MG-MCG: 1-20 | 84 days supply | Qty: 84 | Fill #0

## 2019-10-25 ENCOUNTER — Other Ambulatory Visit: Payer: Self-pay | Admitting: Family Medicine

## 2019-10-25 DIAGNOSIS — N644 Mastodynia: Secondary | ICD-10-CM

## 2019-11-03 ENCOUNTER — Ambulatory Visit
Admission: RE | Admit: 2019-11-03 | Discharge: 2019-11-03 | Disposition: A | Discharge status: Home / Self Care | Payer: BC Managed Care – PPO | Source: Ambulatory Visit | Attending: Family Medicine | Admitting: Family Medicine

## 2019-11-03 ENCOUNTER — Other Ambulatory Visit: Payer: Self-pay

## 2019-11-03 DIAGNOSIS — N644 Mastodynia: Secondary | ICD-10-CM

## 2019-11-06 ENCOUNTER — Other Ambulatory Visit: Payer: Self-pay

## 2019-11-06 ENCOUNTER — Inpatient Hospital Stay (HOSPITAL_COMMUNITY)
Admission: EM | Admit: 2019-11-06 | Discharge: 2019-11-11 | DRG: 059 | Disposition: A | Discharge status: Home / Self Care | Payer: No Typology Code available for payment source | Attending: Internal Medicine | Admitting: Internal Medicine

## 2019-11-06 DIAGNOSIS — G379 Demyelinating disease of central nervous system, unspecified: Secondary | ICD-10-CM

## 2019-11-06 DIAGNOSIS — R200 Anesthesia of skin: Secondary | ICD-10-CM

## 2019-11-06 DIAGNOSIS — Z20822 Contact with and (suspected) exposure to covid-19: Secondary | ICD-10-CM | POA: Diagnosis present

## 2019-11-06 DIAGNOSIS — G35 Multiple sclerosis: Principal | ICD-10-CM | POA: Diagnosis present

## 2019-11-06 DIAGNOSIS — G35A Relapsing-remitting multiple sclerosis: Secondary | ICD-10-CM | POA: Diagnosis present

## 2019-11-06 DIAGNOSIS — Z8349 Family history of other endocrine, nutritional and metabolic diseases: Secondary | ICD-10-CM

## 2019-11-06 DIAGNOSIS — Z833 Family history of diabetes mellitus: Secondary | ICD-10-CM

## 2019-11-06 DIAGNOSIS — Z794 Long term (current) use of insulin: Secondary | ICD-10-CM

## 2019-11-06 DIAGNOSIS — E1043 Type 1 diabetes mellitus with diabetic autonomic (poly)neuropathy: Secondary | ICD-10-CM | POA: Diagnosis present

## 2019-11-06 DIAGNOSIS — Z888 Allergy status to other drugs, medicaments and biological substances status: Secondary | ICD-10-CM

## 2019-11-06 DIAGNOSIS — Z809 Family history of malignant neoplasm, unspecified: Secondary | ICD-10-CM

## 2019-11-06 DIAGNOSIS — E282 Polycystic ovarian syndrome: Secondary | ICD-10-CM | POA: Diagnosis present

## 2019-11-06 DIAGNOSIS — E559 Vitamin D deficiency, unspecified: Secondary | ICD-10-CM | POA: Diagnosis present

## 2019-11-06 DIAGNOSIS — Z6841 Body Mass Index (BMI) 40.0 and over, adult: Secondary | ICD-10-CM

## 2019-11-06 DIAGNOSIS — R29898 Other symptoms and signs involving the musculoskeletal system: Secondary | ICD-10-CM

## 2019-11-06 DIAGNOSIS — E109 Type 1 diabetes mellitus without complications: Secondary | ICD-10-CM | POA: Diagnosis present

## 2019-11-06 DIAGNOSIS — Z885 Allergy status to narcotic agent status: Secondary | ICD-10-CM

## 2019-11-07 ENCOUNTER — Other Ambulatory Visit: Payer: BC Managed Care – PPO

## 2019-11-07 ENCOUNTER — Emergency Department (HOSPITAL_COMMUNITY): Payer: No Typology Code available for payment source

## 2019-11-07 ENCOUNTER — Encounter (HOSPITAL_COMMUNITY): Payer: Self-pay | Admitting: Emergency Medicine

## 2019-11-07 ENCOUNTER — Other Ambulatory Visit: Payer: Self-pay

## 2019-11-07 DIAGNOSIS — Z794 Long term (current) use of insulin: Secondary | ICD-10-CM | POA: Diagnosis not present

## 2019-11-07 DIAGNOSIS — Z20822 Contact with and (suspected) exposure to covid-19: Secondary | ICD-10-CM | POA: Diagnosis present

## 2019-11-07 DIAGNOSIS — Z888 Allergy status to other drugs, medicaments and biological substances status: Secondary | ICD-10-CM | POA: Diagnosis not present

## 2019-11-07 DIAGNOSIS — E109 Type 1 diabetes mellitus without complications: Secondary | ICD-10-CM

## 2019-11-07 DIAGNOSIS — E559 Vitamin D deficiency, unspecified: Secondary | ICD-10-CM | POA: Diagnosis present

## 2019-11-07 DIAGNOSIS — Z8349 Family history of other endocrine, nutritional and metabolic diseases: Secondary | ICD-10-CM | POA: Diagnosis not present

## 2019-11-07 DIAGNOSIS — Z885 Allergy status to narcotic agent status: Secondary | ICD-10-CM | POA: Diagnosis not present

## 2019-11-07 DIAGNOSIS — G35A Relapsing-remitting multiple sclerosis: Secondary | ICD-10-CM | POA: Diagnosis present

## 2019-11-07 DIAGNOSIS — E1043 Type 1 diabetes mellitus with diabetic autonomic (poly)neuropathy: Secondary | ICD-10-CM | POA: Diagnosis present

## 2019-11-07 DIAGNOSIS — G379 Demyelinating disease of central nervous system, unspecified: Secondary | ICD-10-CM

## 2019-11-07 DIAGNOSIS — Z6841 Body Mass Index (BMI) 40.0 and over, adult: Secondary | ICD-10-CM | POA: Diagnosis not present

## 2019-11-07 DIAGNOSIS — E282 Polycystic ovarian syndrome: Secondary | ICD-10-CM | POA: Diagnosis present

## 2019-11-07 DIAGNOSIS — G35 Multiple sclerosis: Secondary | ICD-10-CM | POA: Diagnosis present

## 2019-11-07 DIAGNOSIS — Z833 Family history of diabetes mellitus: Secondary | ICD-10-CM | POA: Diagnosis not present

## 2019-11-07 DIAGNOSIS — Z809 Family history of malignant neoplasm, unspecified: Secondary | ICD-10-CM | POA: Diagnosis not present

## 2019-11-07 LAB — I-STAT CHEM 8, ED
BUN: 13 mg/dL (ref 6–20)
Calcium, Ion: 1.2 mmol/L (ref 1.15–1.40)
Chloride: 107 mmol/L (ref 98–111)
Creatinine, Ser: 0.5 mg/dL (ref 0.44–1.00)
Glucose, Bld: 240 mg/dL — ABNORMAL HIGH (ref 70–99)
HCT: 42 % (ref 36.0–46.0)
Hemoglobin: 14.3 g/dL (ref 12.0–15.0)
Potassium: 3.7 mmol/L (ref 3.5–5.1)
Sodium: 140 mmol/L (ref 135–145)
TCO2: 25 mmol/L (ref 22–32)

## 2019-11-07 LAB — COMPREHENSIVE METABOLIC PANEL
ALT: 26 U/L (ref 0–44)
AST: 21 U/L (ref 15–41)
Albumin: 3.1 g/dL — ABNORMAL LOW (ref 3.5–5.0)
Alkaline Phosphatase: 104 U/L (ref 38–126)
Anion gap: 10 (ref 5–15)
BUN: 11 mg/dL (ref 6–20)
CO2: 22 mmol/L (ref 22–32)
Calcium: 9 mg/dL (ref 8.9–10.3)
Chloride: 106 mmol/L (ref 98–111)
Creatinine, Ser: 0.64 mg/dL (ref 0.44–1.00)
GFR calc Af Amer: >60 mL/min (ref >60)
GFR calc non Af Amer: >60 mL/min (ref >60)
Glucose, Bld: 256 mg/dL — ABNORMAL HIGH (ref 70–99)
Potassium: 3.7 mmol/L (ref 3.5–5.1)
Sodium: 138 mmol/L (ref 135–145)
Total Bilirubin: 0.5 mg/dL (ref 0.3–1.2)
Total Protein: 6.5 g/dL (ref 6.5–8.1)

## 2019-11-07 LAB — BASIC METABOLIC PANEL
Anion gap: 14 (ref 5–15)
BUN: 15 mg/dL (ref 6–20)
CO2: 18 mmol/L — ABNORMAL LOW (ref 22–32)
Calcium: 8.6 mg/dL — ABNORMAL LOW (ref 8.9–10.3)
Chloride: 104 mmol/L (ref 98–111)
Creatinine, Ser: 0.89 mg/dL (ref 0.44–1.00)
GFR calc Af Amer: >60 mL/min (ref >60)
GFR calc non Af Amer: >60 mL/min (ref >60)
Glucose, Bld: 504 mg/dL (ref 70–99)
Potassium: 4.7 mmol/L (ref 3.5–5.1)
Sodium: 136 mmol/L (ref 135–145)

## 2019-11-07 LAB — I-STAT BETA HCG BLOOD, ED (MC, WL, AP ONLY): I-stat hCG, quantitative: <5 m[IU]/mL (ref <5)

## 2019-11-07 LAB — GLUCOSE, CAPILLARY
Glucose-Capillary: 121 mg/dL — ABNORMAL HIGH (ref 70–99)
Glucose-Capillary: 154 mg/dL — ABNORMAL HIGH (ref 70–99)
Glucose-Capillary: 181 mg/dL — ABNORMAL HIGH (ref 70–99)
Glucose-Capillary: 182 mg/dL — ABNORMAL HIGH (ref 70–99)
Glucose-Capillary: 208 mg/dL — ABNORMAL HIGH (ref 70–99)
Glucose-Capillary: 220 mg/dL — ABNORMAL HIGH (ref 70–99)
Glucose-Capillary: 222 mg/dL — ABNORMAL HIGH (ref 70–99)
Glucose-Capillary: 223 mg/dL — ABNORMAL HIGH (ref 70–99)
Glucose-Capillary: 360 mg/dL — ABNORMAL HIGH (ref 70–99)
Glucose-Capillary: 434 mg/dL — ABNORMAL HIGH (ref 70–99)
Glucose-Capillary: 459 mg/dL — ABNORMAL HIGH (ref 70–99)
Glucose-Capillary: 496 mg/dL — ABNORMAL HIGH (ref 70–99)

## 2019-11-07 LAB — DIFFERENTIAL
Abs Immature Granulocytes: 0.03 10*3/uL (ref 0.00–0.07)
Basophils Absolute: 0.1 10*3/uL (ref 0.0–0.1)
Basophils Relative: 1 %
Eosinophils Absolute: 0.4 10*3/uL (ref 0.0–0.5)
Eosinophils Relative: 4 %
Immature Granulocytes: 0 %
Lymphocytes Relative: 41 %
Lymphs Abs: 3.9 10*3/uL (ref 0.7–4.0)
Monocytes Absolute: 0.4 10*3/uL (ref 0.1–1.0)
Monocytes Relative: 4 %
Neutro Abs: 4.7 10*3/uL (ref 1.7–7.7)
Neutrophils Relative %: 50 %

## 2019-11-07 LAB — PROTIME-INR
INR: 0.9 (ref 0.8–1.2)
Prothrombin Time: 12.3 seconds (ref 11.4–15.2)

## 2019-11-07 LAB — CBC
HCT: 42 % (ref 36.0–46.0)
Hemoglobin: 13.2 g/dL (ref 12.0–15.0)
MCH: 27 pg (ref 26.0–34.0)
MCHC: 31.4 g/dL (ref 30.0–36.0)
MCV: 86.1 fL (ref 80.0–100.0)
Platelets: 411 10*3/uL — ABNORMAL HIGH (ref 150–400)
RBC: 4.88 MIL/uL (ref 3.87–5.11)
RDW: 14.1 % (ref 11.5–15.5)
WBC: 9.4 10*3/uL (ref 4.0–10.5)
nRBC: 0 % (ref 0.0–0.2)

## 2019-11-07 LAB — HEMOGLOBIN A1C
Hgb A1c MFr Bld: 9.4 % — ABNORMAL HIGH (ref 4.8–5.6)
Mean Plasma Glucose: 223.08 mg/dL

## 2019-11-07 LAB — APTT: aPTT: 24 seconds (ref 24–36)

## 2019-11-07 LAB — VITAMIN B12: Vitamin B-12: 229 pg/mL (ref 180–914)

## 2019-11-07 LAB — TSH: TSH: 2.716 u[IU]/mL (ref 0.350–4.500)

## 2019-11-07 LAB — SARS CORONAVIRUS 2 (TAT 6-24 HRS): SARS Coronavirus 2: NEGATIVE

## 2019-11-07 LAB — VITAMIN D 25 HYDROXY (VIT D DEFICIENCY, FRACTURES): Vit D, 25-Hydroxy: 11.62 ng/mL — ABNORMAL LOW (ref 30–100)

## 2019-11-07 MED ORDER — ACETAMINOPHEN 650 MG RE SUPP: 650.0000 mg | Freq: Four times a day (QID) | RECTAL | Status: DC | PRN

## 2019-11-07 MED ORDER — INSULIN REGULAR(HUMAN) IN NACL 100-0.9 UT/100ML-% IV SOLN
INTRAVENOUS | Status: DC
  Administered 2019-11-07: 17 [IU]/h via INTRAVENOUS
  Filled 2019-11-07: qty 100

## 2019-11-07 MED ORDER — INSULIN GLARGINE 100 UNIT/ML ~~LOC~~ SOLN
80.0000 [IU] | Freq: Every day | SUBCUTANEOUS | Status: DC
  Filled 2019-11-07: qty 0.8

## 2019-11-07 MED ORDER — ACETAMINOPHEN 325 MG PO TABS
650.0000 mg | ORAL_TABLET | Freq: Four times a day (QID) | ORAL | Status: DC | PRN
  Administered 2019-11-08 – 2019-11-10 (×3): 650 mg via ORAL
  Filled 2019-11-07 (×3): qty 2

## 2019-11-07 MED ORDER — ENOXAPARIN SODIUM 40 MG/0.4ML ~~LOC~~ SOLN
40.0000 mg | SUBCUTANEOUS | Status: DC
  Administered 2019-11-07 – 2019-11-10 (×4): 40 mg via SUBCUTANEOUS
  Filled 2019-11-07 (×4): qty 0.4

## 2019-11-07 MED ORDER — INSULIN ASPART 100 UNIT/ML ~~LOC~~ SOLN
23.0000 [IU] | Freq: Three times a day (TID) | SUBCUTANEOUS | Status: DC
  Administered 2019-11-08 (×2): 23 [IU] via SUBCUTANEOUS

## 2019-11-07 MED ORDER — INSULIN ASPART 100 UNIT/ML ~~LOC~~ SOLN
20.0000 [IU] | Freq: Once | SUBCUTANEOUS | Status: AC
  Administered 2019-11-07: 20 [IU] via SUBCUTANEOUS

## 2019-11-07 MED ORDER — SODIUM CHLORIDE 0.9 % IV SOLN: INTRAVENOUS | Status: DC

## 2019-11-07 MED ORDER — ESCITALOPRAM OXALATE 10 MG PO TABS
20.0000 mg | ORAL_TABLET | Freq: Every day | ORAL | Status: DC
  Administered 2019-11-07 – 2019-11-11 (×5): 20 mg via ORAL
  Filled 2019-11-07 (×6): qty 2

## 2019-11-07 MED ORDER — PANTOPRAZOLE SODIUM 40 MG IV SOLR
40.0000 mg | INTRAVENOUS | Status: DC
  Administered 2019-11-07 – 2019-11-10 (×4): 40 mg via INTRAVENOUS
  Filled 2019-11-07 (×4): qty 40

## 2019-11-07 MED ORDER — SODIUM CHLORIDE 0.9 % IV SOLN
1000.0000 mg | Freq: Every day | INTRAVENOUS | Status: AC
  Administered 2019-11-07 – 2019-11-11 (×5): 1000 mg via INTRAVENOUS
  Filled 2019-11-07 (×6): qty 8

## 2019-11-07 MED ORDER — INSULIN ASPART 100 UNIT/ML ~~LOC~~ SOLN
10.0000 [IU] | Freq: Once | SUBCUTANEOUS | Status: AC
  Administered 2019-11-07: 10 [IU] via SUBCUTANEOUS

## 2019-11-07 MED ORDER — INSULIN LISPRO (1 UNIT DIAL) 100 UNIT/ML (KWIKPEN): 23.0000 [IU] | PEN_INJECTOR | Freq: Three times a day (TID) | SUBCUTANEOUS | Status: DC

## 2019-11-07 MED ORDER — PROMETHAZINE HCL 25 MG/ML IJ SOLN
12.5000 mg | Freq: Once | INTRAMUSCULAR | Status: AC
  Administered 2019-11-07: 12.5 mg via INTRAVENOUS
  Filled 2019-11-07: qty 1

## 2019-11-07 MED ORDER — GADOBUTROL 1 MMOL/ML IV SOLN
10.0000 mL | Freq: Once | INTRAVENOUS | Status: AC | PRN
  Administered 2019-11-07: 10 mL via INTRAVENOUS

## 2019-11-07 MED ORDER — INSULIN ASPART 100 UNIT/ML ~~LOC~~ SOLN
0.0000 [IU] | Freq: Three times a day (TID) | SUBCUTANEOUS | Status: DC
  Administered 2019-11-08 (×2): 4 [IU] via SUBCUTANEOUS
  Administered 2019-11-08: 7 [IU] via SUBCUTANEOUS
  Administered 2019-11-09 (×3): 4 [IU] via SUBCUTANEOUS
  Administered 2019-11-10 (×2): 3 [IU] via SUBCUTANEOUS
  Administered 2019-11-10 – 2019-11-11 (×2): 4 [IU] via SUBCUTANEOUS

## 2019-11-07 MED ORDER — RAMELTEON 8 MG PO TABS
8.0000 mg | ORAL_TABLET | Freq: Every day | ORAL | Status: DC
  Administered 2019-11-07 – 2019-11-10 (×4): 8 mg via ORAL
  Filled 2019-11-07 (×5): qty 1

## 2019-11-07 MED ORDER — DEXTROSE 50 % IV SOLN: 0.0000 mL | INTRAVENOUS | Status: DC | PRN

## 2019-11-07 MED ORDER — INSULIN GLARGINE 100 UNIT/ML ~~LOC~~ SOLN
80.0000 [IU] | Freq: Every day | SUBCUTANEOUS | Status: DC
  Administered 2019-11-08 (×2): 80 [IU] via SUBCUTANEOUS
  Filled 2019-11-07 (×3): qty 0.8

## 2019-11-07 MED ORDER — PROCHLORPERAZINE EDISYLATE 10 MG/2ML IJ SOLN
10.0000 mg | Freq: Four times a day (QID) | INTRAMUSCULAR | Status: DC | PRN
  Administered 2019-11-07 – 2019-11-08 (×2): 10 mg via INTRAVENOUS
  Filled 2019-11-07 (×2): qty 2

## 2019-11-07 NOTE — ED Notes (Signed)
Neuro at bedside.

## 2019-11-07 NOTE — ED Provider Notes (Signed)
Care assumed from Leanora Ivanoff, PA-C at shift change with MRI pending.   In brief, this patient is a 24 y.o. F who presents for evaluation of left upper extremity and trunk numbness and weakness that has been ongoing for about 2 days.  No fevers, visual changes.  Please see note from previous provider for full history/physical exam.   Physical Exam  BP 139/88   Pulse 99   Temp 98.4 F (36.9 C) (Oral)   Resp 15   Ht 5\' 4"  (1.626 m)   Wt 114.3 kg   SpO2 98%   BMI 43.26 kg/m   Physical Exam  Subjective decrease sensation on the left upper extremity that extends from the shoulder distally.  Slightly unequal grip strength noted from left upper extremity.  4/5 strength of left upper extremity.  5/5 strength of right upper extremity.  ED Course/Procedures     Procedures   Results for orders placed or performed during the hospital encounter of 11/06/19 (from the past 24 hour(s))  Protime-INR     Status: None   Collection Time: 11/07/19 12:08 AM  Result Value Ref Range   Prothrombin Time 12.3 11.4 - 15.2 seconds   INR 0.9 0.8 - 1.2  APTT     Status: None   Collection Time: 11/07/19 12:08 AM  Result Value Ref Range   aPTT 24 24 - 36 seconds  CBC     Status: Abnormal   Collection Time: 11/07/19 12:08 AM  Result Value Ref Range   WBC 9.4 4.0 - 10.5 K/uL   RBC 4.88 3.87 - 5.11 MIL/uL   Hemoglobin 13.2 12.0 - 15.0 g/dL   HCT 42.0 36.0 - 46.0 %   MCV 86.1 80.0 - 100.0 fL   MCH 27.0 26.0 - 34.0 pg   MCHC 31.4 30.0 - 36.0 g/dL   RDW 14.1 11.5 - 15.5 %   Platelets 411 (H) 150 - 400 K/uL   nRBC 0.0 0.0 - 0.2 %  Differential     Status: None   Collection Time: 11/07/19 12:08 AM  Result Value Ref Range   Neutrophils Relative % 50 %   Neutro Abs 4.7 1.7 - 7.7 K/uL   Lymphocytes Relative 41 %   Lymphs Abs 3.9 0.7 - 4.0 K/uL   Monocytes Relative 4 %   Monocytes Absolute 0.4 0.1 - 1.0 K/uL   Eosinophils Relative 4 %   Eosinophils Absolute 0.4 0.0 - 0.5 K/uL   Basophils Relative 1  %   Basophils Absolute 0.1 0.0 - 0.1 K/uL   Immature Granulocytes 0 %   Abs Immature Granulocytes 0.03 0.00 - 0.07 K/uL  Comprehensive metabolic panel     Status: Abnormal   Collection Time: 11/07/19 12:08 AM  Result Value Ref Range   Sodium 138 135 - 145 mmol/L   Potassium 3.7 3.5 - 5.1 mmol/L   Chloride 106 98 - 111 mmol/L   CO2 22 22 - 32 mmol/L   Glucose, Bld 256 (H) 70 - 99 mg/dL   BUN 11 6 - 20 mg/dL   Creatinine, Ser 0.64 0.44 - 1.00 mg/dL   Calcium 9.0 8.9 - 10.3 mg/dL   Total Protein 6.5 6.5 - 8.1 g/dL   Albumin 3.1 (L) 3.5 - 5.0 g/dL   AST 21 15 - 41 U/L   ALT 26 0 - 44 U/L   Alkaline Phosphatase 104 38 - 126 U/L   Total Bilirubin 0.5 0.3 - 1.2 mg/dL   GFR calc non Af Amer >  60 >60 mL/min   GFR calc Af Amer >60 >60 mL/min   Anion gap 10 5 - 15  I-Stat beta hCG blood, ED     Status: None   Collection Time: 11/07/19 12:25 AM  Result Value Ref Range   I-stat hCG, quantitative <5.0 <5 mIU/mL   Comment 3          I-stat chem 8, ED     Status: Abnormal   Collection Time: 11/07/19 12:27 AM  Result Value Ref Range   Sodium 140 135 - 145 mmol/L   Potassium 3.7 3.5 - 5.1 mmol/L   Chloride 107 98 - 111 mmol/L   BUN 13 6 - 20 mg/dL   Creatinine, Ser 2.95 0.44 - 1.00 mg/dL   Glucose, Bld 284 (H) 70 - 99 mg/dL   Calcium, Ion 1.32 1.15 - 1.40 mmol/L   TCO2 25 22 - 32 mmol/L   Hemoglobin 14.3 12.0 - 15.0 g/dL   HCT 44.0 10.2 - 72.5 %     MDM   PLAN: Patient was discussed with neuro by previous provider who recommended CT C-spine and T-spine.  On her cervical spine CT, there was a cystic lesion noted and it was recommended that she get an MRI brain.  Per neuro recommendation, if MRI brain is clear, she will need LP to evaluate for possible transverse myelitis.  If MRI brain shows demyelinating disease, may need admission.  MDM:  MRI cervical spine shows an abnormal area of enhancement that could be representative of demyelinating disease.  There is also mention of  enhancement at T1 level.  Given these findings, MRI brain was ordered.  MRI brain shows moderate burden of white matter lesions suspicious for primary demyelinating disease with several areas representing acute demyelination.   Discussed patient with Dr. Wilford Corner (Neuro).  He has been informed by the patient from night team.  He will come evaluate the patient in the ED.  He recommends admission to medicine and he will put in orders for further medication. He recommends 1 g of solumedrol for 5 days.  Will initiate the first order.  Discussed patient with Internal Medicine who accepts patient for admission.    1. LUE numbness   2. LUE weakness   3. Demyelinating disease (HCC)      Portions of this note were generated with Dragon dictation software. Dictation errors may occur despite best attempts at proofreading.     Maxwell Caul, PA-C 11/07/19 0944    Eber Hong, MD 11/09/19 [phone]

## 2019-11-07 NOTE — Progress Notes (Addendum)
Inpatient Diabetes Program Recommendations  AACE/ADA: New Consensus Statement on Inpatient Glycemic Control (2015)  Target Ranges:  Prepandial:   less than 140 mg/dL      Peak postprandial:   less than 180 mg/dL (1-2 hours)      Critically ill patients:  140 - 180 mg/dL   Lab Results  Component Value Date   GLUCAP 75 08/08/2018   HGBA1C 7.7 07/27/2015    Review of Glycemic Control  Diabetes history: DM type 1 (makes no insulin) Outpatient Diabetes medications: Toujeo U-300 80 units qhs, Humalog 23 units tid Current orders for Inpatient glycemic control:  none  Inpatient Diabetes Program Recommendations:     Pt has type 1 DM and will need close to her insulin regimen reordered. Pt also just started on Solumedrol 1000 mg Daily.  Lantus 65 units qhs Novolog 0-15 units tid + hs Novolog 10 units tid meal coverage if eating >50% of meals  Thanks,  Christena Deem RN, MSN, BC-ADM Inpatient Diabetes Coordinator Team Pager [phone] (8a-5p)

## 2019-11-07 NOTE — H&P (Signed)
Date: 11/07/2019               Patient Name:  Danielle Baker MRN: [id number]  DOB: 31-Aug-1995 Age / Sex: 24 y.o., female   PCP: Abner Greenspan, MD         Medical Service: Internal Medicine Teaching Service         Attending Physician: Dr. Mikey Bussing, Marthenia Rolling, DO    First Contact: Ephriam Knuckles, MD, Rylee Pager: [phone])  Second Contact: Cleaster Corin DO, Jaimie PagerDuane Lope [phone])       After Hours (After 5p/  First Contact Pager: [phone]  weekends / holidays): Second Contact Pager: [phone]   Chief Complaint: numbness, weakness  History of Present Illness: 24 y.o. yo female w/ PMH significant for T1DM, PCOS,.  Presents with left sided arm numbness began a few days ago then progressed in the left side of her chest and back, left upper extremity weakness, dizziness, headache, no vision changes.  She denies any infectious symptoms specifically cough, dysuria, fevers, chills, sick contacts.  She was vaccinated for covid and received both doses in February.  She reports last a1c is around 8 by her pcp in [place]. She follows with endocrinology in [place].     Meds:  Current Meds  Medication Sig  . escitalopram (LEXAPRO) 20 MG tablet Take 20 mg by mouth daily.  Marland Kitchen HUMALOG KWIKPEN 100 UNIT/ML KwikPen Inject 23 Units into the skin in the morning, at noon, and at bedtime.  Nathen May MAX SOLOSTAR 300 UNIT/ML Solostar Pen Inject 80 Units into the skin at bedtime.     Allergies: Allergies as of 11/06/2019 - Review Complete 08/06/2018  Allergen Reaction Noted  . Enalapril Other (See Comments) 08/30/2013  . Morphine and related Rash 12/16/2010   Past Medical History:  Diagnosis Date  . Heart murmur    as a child  . Hypoglycemia associated with diabetes (HCC)   . Obesity, morbid (HCC)   . Pancreatitis   . Tachycardia   . Type 1 diabetes mellitus not at goal [facility])    since age of 24 years old    Family History:  Family History  Problem Relation Age of Onset  . Thyroid  disease Mother   . Obesity Mother   . Diabetes Father   . Obesity Father   . Obesity Sister   . Cancer Maternal Grandmother   . Obesity Paternal Grandfather     Aunt on Father's side has MS Father T1DM   Social History:  Social History   Tobacco Use  . Smoking status: Never Smoker  . Smokeless tobacco: Never Used  Substance Use Topics  . Alcohol use: No    Alcohol/week: 0.0 standard drinks  . Drug use: No    No recreational drug use Review of Systems: A complete ROS was negative except as per HPI.   Physical Exam: Blood pressure 139/88, pulse 99, temperature 98.4 F (36.9 C), temperature source Oral, resp. rate 15, height 5\' 4"  (1.626 m), weight 114.3 kg, SpO2 98 %, unknown if currently breastfeeding. Physical Exam Constitutional:      General: She is not in acute distress.    Appearance: She is not diaphoretic.  HENT:     Head: Normocephalic and atraumatic.  Cardiovascular:     Rate and Rhythm: Normal rate and regular rhythm.     Heart sounds: Normal heart sounds. No murmur. No friction rub. No gallop.   Pulmonary:     Effort: Pulmonary effort is  normal. No respiratory distress.     Breath sounds: Normal breath sounds. No wheezing or rales.  Chest:     Chest wall: No tenderness.  Abdominal:     General: Bowel sounds are normal. There is no distension.     Palpations: Abdomen is soft. There is no mass.     Tenderness: There is no abdominal tenderness. There is no guarding or rebound.  Neurological:     Mental Status: She is alert.     Comments: II: Visual fields intact III,IV, WC:HENIDP not present,EOMI without nystagmus V,VII:No facial droop. Facial tempsensation equalbilaterally VIII: hearingintact to voice IX,X:Palaterises symmetrically OE:[id number] shrug XII: midline tongue extension Motor: RUE and RLE 5/5 LUE 4/5 LLE 5/5 Sensory:she has no sensation to touch on left anterior chest and posterior back at about the level of the mid  sternum down to L3 L4 level including the LUE.   Deep Tendon Reflexes: Normoactive x 4 without asymmetry Cerebellar:no FNF dysmetria  and normal HS Gait:Deferred    Psychiatric:        Mood and Affect: Mood normal.        Behavior: Behavior normal.      EKG: personally reviewed my interpretation is sinus tachycardia  CXR: personally reviewed my interpretation is none available  Assessment & Plan by Problem: Active Problems:   Multiple sclerosis exacerbation (HCC)  MS exacerbation: no prior hx, has had neuropathic symptoms in the past which could have been MS as she is very young for neuropathy.  She has multiple lesions present on MRI brain, C and T spine imaging, symptoms consistent with imaging findings primarily numbness, LUE weakness, dizziness with associated nausea, family history of T1DM and MS on her father's side.  -neurology consulted appreciate assistance she was started on IV solumedrol 1000mg  x5 days -NMO ab, TSH, ACE and B12 ordered, will add vitamin D -defer JC virus, Tb, hepatitis panel to outpatient as this workup will depend on her specific maintenance therapy discussed with neurology -compazine for nausea, tylenol for headache   T1DM: diagnosed 2002. Was on the insulin pump in the past, currently on basal bolus with toujeo U300 qhs for basal, mealtime and SSI that she adjusts based on her meals.  She is clearly a very insulin resistant type 1, she has had multiple episodes of DKA in the past.    -cbg already >250 she is about to receive high dose steroids, will start with endotool hyperglycemia protocol, she is at high risk of going into DKA -will follow her insulin requirements on the insulin drip and see if there is a regimen we can safely transition her to -check a1c here   Dispo: Admit patient to Inpatient with expected length of stay greater than 2 midnights.  Signed: Katherine Roan, MD 11/07/2019, 10:12 AM

## 2019-11-07 NOTE — ED Notes (Signed)
Attempted to call nursing report.  

## 2019-11-07 NOTE — ED Provider Notes (Signed)
[facility] EMERGENCY DEPARTMENT Provider Note   CSN: [id number] Arrival date & time: 11/06/19  2351     History Chief Complaint  Patient presents with  . Numbness    JANASHIA PARCO is a 24 y.o. female with a history of diabetes mellitus type 1 who presents to the emergency department with a chief complaint of numbness.  The patient reports that 2 days ago that she developed numbness in her bilateral hands, left significantly greater than right.  She reports that since onset her entire left arm is now numb.  Today, she noticed that her left abdomen and mid and lower back were also numb.  She reports that she is having more difficulty gripping objects with her left hand today.  She also reports intermittent dizziness and feeling off balance that is worse with walking.  No paresthesias.  She denies any associated pain to the areas where she is numb.  She denies lightheadedness, fever, chills, nausea, vomiting, urinary or fecal incontinence, falls, or visual changes.  She reports that she has had a mild intermittent headache.  She does note that she felt some pain in her neck several days ago when lifting her child's car seat, but the pain went away shortly thereafter and she has had no neck or back pain today.  She had a telemedicine visit earlier today and was advised to come to the emergency department for further evaluation.  On my evaluation, she denies any history of neuropathy.  However, it appears that she has a history of diabetic autonomic neuropathy on chart review.  The history is provided by the patient. No language interpreter was used.       Past Medical History:  Diagnosis Date  . Heart murmur    as a child  . Hypoglycemia associated with diabetes (HCC)   . Obesity, morbid (HCC)   . Pancreatitis   . Tachycardia   . Type 1 diabetes mellitus not at goal [facility])    since age of 24 years old    Patient Active Problem List   Diagnosis Date Noted  . History  of cesarean delivery 08/06/2018  . Diabetes mellitus complicating pregnancy in third trimester, antepartum 03/18/2016  . DKA (diabetic ketoacidoses) (HCC) 08/31/2013  . Hypoglycemia associated with diabetes (HCC)   . DM autonomic neuropathy (HCC)   . 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 Goiter   . Obesity, morbid (HCC)   . Dyspepsia   . Acanthosis nigricans, acquired   . Tachycardia   . Hypertension associated with diabetes (HCC)   . Uncontrolled type 1 diabetes mellitus (HCC) 12/16/2010  . Goiter, unspecified 12/16/2010  . Obesity 12/16/2010    Past Surgical History:  Procedure Laterality Date  . CESAREAN SECTION N/A 03/17/2016   Procedure: CESAREAN SECTION;  Surgeon: Marlow Baars, MD;  Location: [facility] BIRTHING SUITES;  Service: Obstetrics;  Laterality: N/A;  REQUEST RNFA  . CESAREAN SECTION N/A 08/06/2018   Procedure: CESAREAN SECTION;  Surgeon: Marlow Baars, MD;  Location: [facility] BIRTHING SUITES;  Service: Obstetrics;  Laterality: N/A;  RNFA AVAILABLE  . CHOLECYSTECTOMY       OB History    Gravida  2   Para  2   Term  2   Preterm      AB      Living  2     SAB      TAB      Ectopic      Multiple  0   Live Births  2           Family History  Problem Relation Age of Onset  . Thyroid disease Mother   . Obesity Mother   . Diabetes Father   . Obesity Father   . Obesity Sister   . Cancer Maternal Grandmother   . Obesity Paternal Grandfather     Social History   Tobacco Use  . Smoking status: Never Smoker  . Smokeless tobacco: Never Used  Substance Use Topics  . Alcohol use: No    Alcohol/week: 0.0 standard drinks  . Drug use: No    Home Medications Prior to Admission medications   Medication Sig Start Date End Date Taking? Authorizing Provider  escitalopram (LEXAPRO) 20 MG tablet Take 20 mg by mouth daily. 10/11/19  Yes [provider]  HUMALOG KWIKPEN 100 UNIT/ML KwikPen Inject 23 Units into the skin in the  morning, at noon, and at bedtime. 10/06/19  Yes [provider]  TOUJEO MAX SOLOSTAR 300 UNIT/ML Solostar Pen Inject 80 Units into the skin at bedtime. 09/23/19  Yes [provider]  insulin aspart (NOVOLOG) 100 UNIT/ML injection Inject 5 Units into the skin 3 (three) times daily with meals. Patient not taking: Reported on 11/07/2019 08/08/18   Philip Aspen, DO  insulin detemir (LEVEMIR) 100 UNIT/ML injection Inject 0.6 mLs (60 Units total) into the skin at bedtime. Patient not taking: Reported on 11/07/2019 08/08/18   Philip Aspen, DO  oxyCODONE (OXY IR/ROXICODONE) 5 MG immediate release tablet Take 1-2 tablets (5-10 mg total) by mouth every 4 (four) hours as needed for moderate pain. Patient not taking: Reported on 11/07/2019 08/08/18   Philip Aspen, DO    Allergies    Enalapril and Morphine and related  Review of Systems   Review of Systems  Constitutional: Negative for activity change, chills, diaphoresis and fever.  HENT: Negative for congestion, sinus pressure, sinus pain, sore throat, tinnitus and voice change.   Respiratory: Negative for shortness of breath.   Cardiovascular: Negative for chest pain.  Gastrointestinal: Negative for abdominal pain, constipation, diarrhea, nausea and vomiting.  Genitourinary: Negative for dysuria, frequency, hematuria, urgency, vaginal discharge and vaginal pain.  Musculoskeletal: Negative for back pain, myalgias, neck pain and neck stiffness.  Skin: Negative for rash.  Allergic/Immunologic: Negative for immunocompromised state.  Neurological: Positive for dizziness, numbness and headaches. Negative for syncope, speech difficulty, weakness and light-headedness.  Psychiatric/Behavioral: Negative for confusion.    Physical Exam Updated Vital Signs BP 132/72   Pulse 82   Temp 98.4 F (36.9 C) (Oral)   Resp 16   Ht 5\' 4"  (1.626 m)   Wt 114.3 kg   SpO2 98%   BMI 43.26 kg/m   Physical Exam Vitals and nursing note  reviewed.  Constitutional:      General: She is not in acute distress.    Appearance: She is not ill-appearing or toxic-appearing.  HENT:     Head: Normocephalic.  Eyes:     General: No visual field deficit.    Extraocular Movements: Extraocular movements intact.     Conjunctiva/sclera: Conjunctivae normal.     Pupils: Pupils are equal, round, and reactive to light.  Cardiovascular:     Rate and Rhythm: Normal rate and regular rhythm.     Heart sounds: No murmur. No friction rub. No gallop.   Pulmonary:     Effort: Pulmonary effort is normal. No respiratory distress.     Breath sounds: No stridor. No wheezing,  rhonchi or rales.  Chest:     Chest wall: No tenderness.  Abdominal:     General: There is no distension.     Palpations: Abdomen is soft. There is no mass.     Tenderness: There is no abdominal tenderness. There is no right CVA tenderness, left CVA tenderness, guarding or rebound.     Hernia: No hernia is present.  Musculoskeletal:     Cervical back: Neck supple.  Skin:    General: Skin is warm.     Findings: No rash.  Neurological:     Mental Status: She is alert and oriented to person, place, and time.     GCS: GCS eye subscore is 4. GCS verbal subscore is 5. GCS motor subscore is 6.     Motor: No pronator drift.     Coordination: Romberg sign negative. Coordination abnormal. Finger-Nose-Finger Test normal.     Deep Tendon Reflexes:     Reflex Scores:      Tricep reflexes are 2+ on the right side and 2+ on the left side.      Patellar reflexes are 2+ on the right side and 2+ on the left side.    Comments: Decreased sensation to sharp and soft touch to the left lower arm and hand at the level of the elbow.  Decreased sensation to sharp and soft touch throughout the thoracic and lumbar back bilaterally and bilateral upper and lower abdomen.  Normal sensation to the bilateral lower extremities.  Cranial nerve 11 decreased with trapezius shrug.  Cranial nerves are  otherwise intact.   Decreased grip strength 3/5 on the left as compared to 5 out of 5 on the right.  4 out of 5 strength of the left lower extremity as compared to 5 out of 5 on the right.  Mildly ataxic gait.  Negative Romberg.  No pronator drift.  No clonus bilaterally.  Psychiatric:        Behavior: Behavior normal.     ED Results / Procedures / Treatments   Labs (all labs ordered are listed, but only abnormal results are displayed) Labs Reviewed  CBC - Abnormal; Notable for the following components:      Result Value   Platelets 411 (*)    All other components within normal limits  COMPREHENSIVE METABOLIC PANEL - Abnormal; Notable for the following components:   Glucose, Bld 256 (*)    Albumin 3.1 (*)    All other components within normal limits  I-STAT CHEM 8, ED - Abnormal; Notable for the following components:   Glucose, Bld 240 (*)    All other components within normal limits  PROTIME-INR  APTT  DIFFERENTIAL  I-STAT BETA HCG BLOOD, ED ([facility], [facility], [facility] ONLY)    EKG EKG Interpretation  Date/Time:  Sunday November 06 2019 23:59:15 EDT Ventricular Rate:  105 PR Interval:  158 QRS Duration: 78 QT Interval:  328 QTC Calculation: 433 R Axis:   49 Text Interpretation: Sinus tachycardia Cannot rule out Anterior infarct , age undetermined Abnormal ECG When compared with ECG of 03/10/2016, No significant change was found Confirmed by Dione Booze ([id number]) on 11/07/2019 12:09:56 AM   Radiology CT HEAD WO CONTRAST  Result Date: 11/07/2019 CLINICAL DATA:  Left arm numbness EXAM: CT HEAD WITHOUT CONTRAST TECHNIQUE: Contiguous axial images were obtained from the base of the skull through the vertex without intravenous contrast. COMPARISON:  09/16/2014 FINDINGS: Brain: No acute intracranial abnormality. Specifically, no hemorrhage, hydrocephalus, mass lesion, acute infarction, or significant  intracranial injury. Vascular: No hyperdense vessel or unexpected calcification. Skull: No acute  calvarial abnormality. Sinuses/Orbits: Visualized paranasal sinuses and mastoids clear. Orbital soft tissues unremarkable. Other: None IMPRESSION: Normal study. Electronically Signed   By: Rolm Baptise M.D.   On: 11/07/2019 01:11    Procedures Procedures (including critical care time)  Medications Ordered in ED Medications - No data to display  ED Course  I have reviewed the triage vital signs and the nursing notes.  Pertinent labs & imaging results that were available during my care of the patient were reviewed by me and considered in my medical decision making (see chart for details).    MDM Rules/Calculators/A&P               NIH Stroke Scale: 49        24 year old female with a history of diabetes mellitus type 1 and diabetic autonomic neuropathy presenting with numbness to the left forearm and hands, right hand, and trunk that began 2 days ago.  She is endorsing a mild headache and dizziness, but no other complaints at this time.  On exam, she has weakness with trapezius shrug on the left and decreased grip strength as well as diffuse numbness to sharp and soft touch throughout the trunk, left forearm and hand.  Minimal numbness to the right hand.  No sensory deficits to the bilateral legs.  She is outside of the Code stroke window.  She is hyperglycemic, but has a normal anion gap and bicarb.  Doubt DKA.  Labs are otherwise reassuring.  Discussed the patient with Dr. Lorraine Lax, neurology has requested MRI of the cervical and thoracic spine.  MRIs are pending at this time.   Spoke with Dr. Lorraine Lax while the patient was in MRI.  He personally reviewed the cervical MRI images and noted a cervical spinal lesion.  He has added on MRI brain.  He recommends if MRI brain is unremarkable then the patient will most likely need a lumbar puncture.  Neurology will continue to follow the patient at shift change.  Patient care transferred to [place] at the end of my shift to follow up on MRI. Patient  presentation, ED course, and plan of care discussed with review of all pertinent labs and imaging. Please see his/her note for further details regarding further ED course and disposition.   Final Clinical Impression(s) / ED Diagnoses Final diagnoses:  None    Rx / DC Orders ED Discharge Orders    None       Joanne Gavel, PA-C 11/07/19 0715    Orpah Greek, MD 11/08/19 [phone]

## 2019-11-07 NOTE — ED Notes (Signed)
Pt taken to MRI via stretcher in stable condition with face mask in place. 

## 2019-11-07 NOTE — Consult Note (Signed)
Neurology Consultation  Reason for Consult: Multiple sclerosis Referring Physician: Lucious Groves, DO  CC: Decreased sensation in the left arm, weakness left arm, decreased sensation in thoracic region  History is obtained from: Patient  HPI: Danielle Baker is a 24 y.o. female with past medical history of type 1 diabetes, tachycardia, obesity, hypoglycemia.  Neurology was asked to consult secondary to abnormal MRI which indicates multiple sclerosis.  Patient states that a couple weeks ago she had a weird sensation that was across her chest.  She states it felt like a burning, numbness, hug type feeling.  On Saturday she noted that her left arm started to have decreased sensation and by Sunday she noted it had moved approximately and she started to have dizzy spells.  She did call telemedicine who recommended that she go to ER to be further evaluated.  Patient did come to ER.  She states that today she is noted she has numbness in her thoracic region and pain in her back.  She is having more dizziness and had emesis today.  At time of consult any movement caused patient to become dizzy, nauseous and at one point she did throw up prior to entering the room.  ED course  Relevant labs include -glucose 256, CT head shows-normal study MRI of thoracic spine with and without contrast, cervical spine with and without contrast and brain with and without contrast-moderate burden of white matter lesions in the distribution suspicious for primary demyelinating disease, particularly multiple sclerosis.  Several lesions demonstrate enhancement reflecting acute demyelination/inflammation.  Foci of abnormal cervical and thoracic cord signal suspicious for demyelinating disease.  There is associated enhancement at the C3-C4 and T1 level reflecting acute inflammation/demyelination   Past Medical History:  Diagnosis Date  . Heart murmur    as a child  . Hypoglycemia associated with diabetes ([place])   . Obesity,  morbid ([place])   . Pancreatitis   . Tachycardia   . Type 1 diabetes mellitus not at goal [facility])    since age of 24 years old    Family History  Problem Relation Age of Onset  . Thyroid disease Mother   . Obesity Mother   . Diabetes Father   . Obesity Father   . Obesity Sister   . Cancer Maternal Grandmother   . Obesity Paternal Grandfather    Social History:   reports that she has never smoked. She has never used smokeless tobacco. She reports that she does not drink alcohol or use drugs.  Medications  Current Facility-Administered Medications:  .  acetaminophen (TYLENOL) tablet 650 mg, 650 mg, Oral, Q6H PRN **OR** acetaminophen (TYLENOL) suppository 650 mg, 650 mg, Rectal, Q6H PRN, Katherine Roan, MD .  enoxaparin (LOVENOX) injection 40 mg, 40 mg, Subcutaneous, Q24H, Winfrey, William B, MD .  methylPREDNISolone sodium succinate (SOLU-MEDROL) 1,000 mg in sodium chloride 0.9 % 50 mL IVPB, 1,000 mg, Intravenous, Daily, Layden, Lindsey A, PA-C .  prochlorperazine (COMPAZINE) injection 10 mg, 10 mg, Intravenous, Q6H PRN, Katherine Roan, MD  Current Outpatient Medications:  .  escitalopram (LEXAPRO) 20 MG tablet, Take 20 mg by mouth daily., Disp: , Rfl:  .  HUMALOG KWIKPEN 100 UNIT/ML KwikPen, Inject 23 Units into the skin in the morning, at noon, and at bedtime., Disp: , Rfl:  .  TOUJEO MAX SOLOSTAR 300 UNIT/ML Solostar Pen, Inject 80 Units into the skin at bedtime., Disp: , Rfl:  .  insulin aspart (NOVOLOG) 100 UNIT/ML injection, Inject 5 Units into  the skin 3 (three) times daily with meals. (Patient not taking: Reported on 11/07/2019), Disp: 10 mL, Rfl: 11 .  insulin detemir (LEVEMIR) 100 UNIT/ML injection, Inject 0.6 mLs (60 Units total) into the skin at bedtime. (Patient not taking: Reported on 11/07/2019), Disp: 10 mL, Rfl: 11 .  oxyCODONE (OXY IR/ROXICODONE) 5 MG immediate release tablet, Take 1-2 tablets (5-10 mg total) by mouth every 4 (four) hours as needed for moderate pain.  (Patient not taking: Reported on 11/07/2019), Disp: 30 tablet, Rfl: 0  ROS:    General ROS: negative for - chills, fatigue, fever, night sweats, weight gain or weight loss Psychological ROS: negative for - behavioral disorder, hallucinations, memory difficulties, mood swings or suicidal ideation Ophthalmic ROS: negative for - blurry vision, double vision, eye pain or loss of vision ENT ROS: negative for - epistaxis, nasal discharge, oral lesions, sore throat, tinnitus or vertigo Allergy and Immunology ROS: negative for - hives or itchy/watery eyes Hematological and Lymphatic ROS: negative for - bleeding problems, bruising or swollen lymph nodes Endocrine ROS: negative for - galactorrhea, hair pattern changes, polydipsia/polyuria or temperature intolerance Respiratory ROS: negative for - cough, hemoptysis, shortness of breath or wheezing Cardiovascular ROS: negative for - chest pain, dyspnea on exertion, edema or irregular heartbeat Gastrointestinal ROS: Positive for -  nausea/vomiting Genito-Urinary ROS: negative for - dysuria, hematuria, incontinence or urinary frequency/urgency Musculoskeletal ROS: negative for - joint swelling or muscular weakness Neurological ROS: as noted in HPI Dermatological ROS: negative for rash and skin lesion changes  Exam: Current vital signs: BP 139/88   Pulse 99   Temp 98.4 F (36.9 C) (Oral)   Resp 15   Ht 5\' 4"  (1.626 m)   Wt 114.3 kg   SpO2 98%   BMI 43.26 kg/m  Vital signs in last 24 hours: Temp:  [98.1 F (36.7 C)-98.4 F (36.9 C)] 98.4 F (36.9 C) (03/22 0355) Pulse Rate:  [80-118] 99 (03/22 0900) Resp:  [14-20] 15 (03/22 0900) BP: (119-151)/(72-95) 139/88 (03/22 0900) SpO2:  [96 %-99 %] 98 % (03/22 0900) Weight:  [114.3 kg] 114.3 kg (03/21 2355)   Constitutional: Appears well-developed and well-nourished.  Psych: Affect appropriate to situation Eyes: No scleral injection HENT: No OP obstrucion Head: Normocephalic.  Cardiovascular:  Normal rate and regular rhythm.  Respiratory: Effort normal, non-labored breathing GI: Soft.  No distension. There is no tenderness.  Skin: WDI Neuro: Mental Status: Patient is awake, alert, oriented to person, place, month, year, and situation. Speech-intact for naming, repeating, comprehension Patient is able to give a clear and coherent history. Cranial Nerves: II: Visual Fields are full.  III,IV, VI: EOMI without ptosis or diploplia. Pupils equal, round and reactive to light V: Facial sensation is symmetric to temperature VII: Facial movement is symmetric.  VIII: hearing is intact to voice X: Palat elevates symmetrically XI: Shoulder shrug is symmetric. XII: tongue is midline without atrophy or fasciculations.  Motor: Tone is normal. Bulk is normal. 5/5 strength on the right arm, bilateral legs.  Patient did show 4/5 strength with left hand grip and left arm.  Sensory: Sensation is symmetric in bilateral lower legs.  Intact sensation on the right arm.  Decreased to no sensation in the left arm Deep Tendon Reflexes: 2+ and symmetric in the biceps and patellae.  Plantars: Toes are downgoing bilaterally.  Cerebellar: FNF and HKS are intact bilaterally  Labs I have reviewed labs in epic and the results pertinent to this consultation are:   CBC    Component  Value Date/Time   WBC 9.4 11/07/2019 0008   RBC 4.88 11/07/2019 0008   HGB 14.3 11/07/2019 0027   HCT 42.0 11/07/2019 0027   PLT 411 (H) 11/07/2019 0008   MCV 86.1 11/07/2019 0008   MCH 27.0 11/07/2019 0008   MCHC 31.4 11/07/2019 0008   RDW 14.1 11/07/2019 0008   LYMPHSABS 3.9 11/07/2019 0008   MONOABS 0.4 11/07/2019 0008   EOSABS 0.4 11/07/2019 0008   BASOSABS 0.1 11/07/2019 0008    CMP     Component Value Date/Time   NA 140 11/07/2019 0027   K 3.7 11/07/2019 0027   CL 107 11/07/2019 0027   CO2 22 11/07/2019 0008   GLUCOSE 240 (H) 11/07/2019 0027   BUN 13 11/07/2019 0027   CREATININE 0.50 11/07/2019 0027    CREATININE 0.62 03/22/2015 1210   CALCIUM 9.0 11/07/2019 0008   PROT 6.5 11/07/2019 0008   ALBUMIN 3.1 (L) 11/07/2019 0008   AST 21 11/07/2019 0008   ALT 26 11/07/2019 0008   ALKPHOS 104 11/07/2019 0008   BILITOT 0.5 11/07/2019 0008   GFRNONAA >60 11/07/2019 0008   GFRNONAA >89 03/22/2015 1210   GFRAA >60 11/07/2019 0008   GFRAA >89 03/22/2015 1210    Lipid Panel     Component Value Date/Time   CHOL 160 03/22/2015 1210   TRIG 151.0 (H) 03/22/2015 1210   HDL 35.30 (L) 03/22/2015 1210   CHOLHDL 5 03/22/2015 1210   VLDL 30.2 03/22/2015 1210   LDLCALC 95 03/22/2015 1210     Imaging I have reviewed the images obtained:  CT head shows-normal study MRI of thoracic spine with and without contrast, cervical spine with and without contrast and brain with and without contrast-moderate burden of white matter lesions in the distribution suspicious for primary demyelinating disease, particularly multiple sclerosis.  Several lesions demonstrate enhancement reflecting acute demyelination/inflammation.  Foci of abnormal cervical and thoracic cord signal suspicious for demyelinating disease.  There is associated enhancement at the C3-C4 and T1 level reflecting acute inflammation/demyelination  Felicie Morn PA-C [facility] [phone]  M-F  (9:00 am- 5:00 PM)  11/07/2019, 10:14 AM     Assessment:  Multiple sclerosis-given patient's symptoms and MRI findings of several lesions demonstrating enhancement reflecting acute demyelination/inflammation consistent with multiple sclerosis.   Other differentials include neuromyelitis optica.  Impression: -Multiple sclerosis  Recommendations: -5 days of IV Solu-Medrol 1 g/day.  Watch sugars closely and use PPI as needed.  Also be mindful that side effects of insomnia/anxiety might happen for which low-dose benzos can be used. -Will order TSH, B12, ACE, and NMO antibodies -Patient will need to see Dr. Epimenio Foot - MS specialist after  discharge to further discuss treatment for multiple sclerosis  Attending Neurohospitalist Addendum Patient seen and examined with APP/Resident. Agree with the history and physical as documented above. Agree with the plan as documented, which I helped formulate. I have independently reviewed the chart, obtained history, review of systems and examined the patient.I have personally reviewed pertinent head/neck/spine imaging (CT/MRI). Please feel free to call with any questions. --- Milon Dikes, MD [facility] Pager: [phone]

## 2019-11-07 NOTE — ED Notes (Signed)
EDP made aware that pt passed swallow screen, awaiting orders.

## 2019-11-07 NOTE — ED Triage Notes (Signed)
Patient reports numbness at left arm/hand and left torso onset yesterday morning . Speech clear with no facial asymmetry , no arm drift , alert and oriented / respirations unlabored.

## 2019-11-08 DIAGNOSIS — E559 Vitamin D deficiency, unspecified: Secondary | ICD-10-CM

## 2019-11-08 LAB — CBC
HCT: 40.7 % (ref 36.0–46.0)
Hemoglobin: 12.8 g/dL (ref 12.0–15.0)
MCH: 26.9 pg (ref 26.0–34.0)
MCHC: 31.4 g/dL (ref 30.0–36.0)
MCV: 85.7 fL (ref 80.0–100.0)
Platelets: 409 10*3/uL — ABNORMAL HIGH (ref 150–400)
RBC: 4.75 MIL/uL (ref 3.87–5.11)
RDW: 14.1 % (ref 11.5–15.5)
WBC: 9.6 10*3/uL (ref 4.0–10.5)
nRBC: 0 % (ref 0.0–0.2)

## 2019-11-08 LAB — COMPREHENSIVE METABOLIC PANEL
ALT: 34 U/L (ref 0–44)
AST: 38 U/L (ref 15–41)
Albumin: 3 g/dL — ABNORMAL LOW (ref 3.5–5.0)
Alkaline Phosphatase: 97 U/L (ref 38–126)
Anion gap: 8 (ref 5–15)
BUN: 12 mg/dL (ref 6–20)
CO2: 22 mmol/L (ref 22–32)
Calcium: 8.5 mg/dL — ABNORMAL LOW (ref 8.9–10.3)
Chloride: 109 mmol/L (ref 98–111)
Creatinine, Ser: 0.58 mg/dL (ref 0.44–1.00)
GFR calc Af Amer: >60 mL/min (ref >60)
GFR calc non Af Amer: >60 mL/min (ref >60)
Glucose, Bld: 141 mg/dL — ABNORMAL HIGH (ref 70–99)
Potassium: 3.9 mmol/L (ref 3.5–5.1)
Sodium: 139 mmol/L (ref 135–145)
Total Bilirubin: 0.7 mg/dL (ref 0.3–1.2)
Total Protein: 6.3 g/dL — ABNORMAL LOW (ref 6.5–8.1)

## 2019-11-08 LAB — GLUCOSE, CAPILLARY
Glucose-Capillary: 115 mg/dL — ABNORMAL HIGH (ref 70–99)
Glucose-Capillary: 124 mg/dL — ABNORMAL HIGH (ref 70–99)
Glucose-Capillary: 140 mg/dL — ABNORMAL HIGH (ref 70–99)
Glucose-Capillary: 152 mg/dL — ABNORMAL HIGH (ref 70–99)
Glucose-Capillary: 196 mg/dL — ABNORMAL HIGH (ref 70–99)
Glucose-Capillary: 197 mg/dL — ABNORMAL HIGH (ref 70–99)
Glucose-Capillary: 211 mg/dL — ABNORMAL HIGH (ref 70–99)
Glucose-Capillary: 308 mg/dL — ABNORMAL HIGH (ref 70–99)

## 2019-11-08 LAB — HIV ANTIBODY (ROUTINE TESTING W REFLEX): HIV Screen 4th Generation wRfx: NONREACTIVE

## 2019-11-08 MED ORDER — INSULIN GLARGINE 100 UNIT/ML ~~LOC~~ SOLN
90.0000 [IU] | Freq: Every day | SUBCUTANEOUS | Status: DC
  Administered 2019-11-09 – 2019-11-11 (×3): 90 [IU] via SUBCUTANEOUS
  Filled 2019-11-08 (×3): qty 0.9

## 2019-11-08 MED ORDER — VITAMIN D (ERGOCALCIFEROL) 1.25 MG (50000 UNIT) PO CAPS
50000.0000 [IU] | ORAL_CAPSULE | ORAL | Status: DC
  Administered 2019-11-08: 50000 [IU] via ORAL
  Filled 2019-11-08: qty 1

## 2019-11-08 MED ORDER — INSULIN ASPART 100 UNIT/ML ~~LOC~~ SOLN
25.0000 [IU] | Freq: Three times a day (TID) | SUBCUTANEOUS | Status: DC
  Administered 2019-11-08 – 2019-11-11 (×9): 25 [IU] via SUBCUTANEOUS

## 2019-11-08 NOTE — Plan of Care (Signed)
Seen and examined Nausea better, no longer vomiting. Subjective numbness unchanged. No objective change since yesterdays exam  RECS: Continue Steroids. Will follow.   -- Milon Dikes, MD [facility] Pager: [phone] If 7pm to 7am, please call on call as listed on AMION.

## 2019-11-08 NOTE — Evaluation (Signed)
Physical Therapy Evaluation Patient Details Name: Danielle Baker MRN: [id number] DOB: 01-15-1996 Today's Date: 11/08/2019   History of Present Illness   Danielle Baker is a 24 y.o. female with past medical history of type 1 diabetes, tachycardia, obesity, hypoglycemia. Presented with dizziness, LUE weakness and LUE numbness, MRI consistent with MS.   Clinical Impression  Patient evaluated by Physical Therapy with no further acute PT needs identified. All education has been completed and the patient has no further questions. Pt independent with mobility at this point. LE strength WFL and pt with no new gait abnormalities or assistance needed. Discussed activity modification as needed to manage MS as well as community support. Discussed assistance that will be needed from family with 2 very young children given her LUE numbness.  See below for any follow-up Physical Therapy or equipment needs. PT is signing off. Thank you for this referral.     Follow Up Recommendations No PT follow up    Equipment Recommendations  None recommended by PT    Recommendations for Other Services       Precautions / Restrictions Precautions Precautions: Fall Restrictions Weight Bearing Restrictions: No      Mobility  Bed Mobility Overal bed mobility: Independent                Transfers Overall transfer level: Modified independent               General transfer comment: no LOB with sit<>stand, denied dizziness. Did later have dizziness with head turn stepping into tub  Ambulation/Gait Ambulation/Gait assistance: Modified independent (Device/Increase time) Gait Distance (Feet): 250 Feet Assistive device: None Gait Pattern/deviations: Wide base of support Gait velocity: WFL Gait velocity interpretation: >4.37 ft/sec, indicative of normal walking speed General Gait Details: widened BOS due to body habitus  Stairs Stairs: Yes Stairs assistance: Modified independent (Device/Increase  time) Stair Management: One rail Right;Alternating pattern;Forwards Number of Stairs: 3 General stair comments: no difficulty with stairs  Wheelchair Mobility    Modified Rankin (Stroke Patients Only)       Balance Overall balance assessment: Modified Independent                                           Pertinent Vitals/Pain Pain Assessment: No/denies pain    Home Living Family/patient expects to be discharged to:: Private residence Living Arrangements: Spouse/significant other;Children;Other (Comment)(sister, 3 and 1 yo) Available Help at Discharge: Family;Available 24 hours/day Type of Home: House Home Access: Stairs to enter Entrance Stairs-Rails: Can reach both Entrance Stairs-Number of Steps: 3 Home Layout: Multi-level Home Equipment: None Additional Comments: sister living with them next couple of yrs per pt report, helps pt with her children    Prior Function Level of Independence: Independent         Comments: 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 reports numbness in thoracic area front and back wrapping around to midline LUE Sensation: decreased proprioception LUE Coordination: decreased fine motor    Lower Extremity Assessment Lower Extremity Assessment: Overall WFL for tasks assessed  Cervical / Trunk Assessment Cervical / Trunk Assessment: Normal  Communication   Communication: No difficulties  Cognition Arousal/Alertness: Awake/alert Behavior During Therapy: WFL for tasks assessed/performed Overall Cognitive Status: Within Functional Limits for tasks assessed                                         General Comments General comments (skin integrity, edema, etc.): discussed energy conservation in regards to MS (avoiding heat, etc). Also discussed cimmunity support and self care     Exercises     Assessment/Plan    PT Assessment Patent does not need any further PT services  PT Problem List         PT Treatment Interventions      PT Goals (Current goals can be found in the Care Plan section)  Acute Rehab PT Goals Patient Stated Goal: return to work and caring for kids PT Goal Formulation: All assessment and education complete, DC therapy    Frequency     Barriers to discharge        Co-evaluation PT/OT/SLP Co-Evaluation/Treatment: Yes Reason for Co-Treatment: Complexity of the patient's impairments (multi-system involvement) PT goals addressed during session: Mobility/safety with mobility;Balance         AM-PAC PT "6 Clicks" Mobility  Outcome Measure Help needed turning from your back to your side while in a flat bed without using bedrails?: None Help needed moving from lying on your back to sitting on the side of a flat bed without using bedrails?: None Help needed moving to and from a bed to a chair (including a wheelchair)?: None Help needed standing up from a chair using your arms (e.g., wheelchair or bedside chair)?: None Help needed to walk in hospital room?: None Help needed climbing 3-5 steps with a railing? : None 6 Click Score: 24    End of Session   Activity Tolerance: Patient tolerated treatment well Patient left: in chair;with call bell/phone within reach Nurse Communication: Mobility status PT Visit Diagnosis: Unsteadiness on feet (R26.81)    Time: 5809-9833 PT Time Calculation (min) (ACUTE ONLY): 22 min   Charges:   PT Evaluation $PT Eval Low Complexity: 1 Low          Leighton Roach, [place]  Pager [phone] Office [place] 11/08/2019, 3:44 PM

## 2019-11-08 NOTE — Progress Notes (Addendum)
NAME:  Danielle Baker, MRN:  [id number], DOB:  1996-07-02, LOS: 1 ADMISSION DATE:  11/06/2019  Subjective/Interm hx  No overnight events. VSS  She continues to have left sided numbness. It originally started in her hand several days ago and advanced into the left side of her back and abdomen. There is a little bit of numbness in the right hand.  She feels much better than yesterday, no more nausea. She has a slight headache. Vertigo has resolved. Her mom has had neurological sx and an aunt on her dad's side has been dx with MS.  She had an episode in 4th grade where she had left sided numbness and a few weeks ago had chest pain with no clear etiology when she saw her PCP.   Objective   Blood pressure 110/60, pulse 70, temperature 98.5 F (36.9 C), temperature source Oral, resp. rate 18, height 5\' 4"  (1.626 m), weight 114.3 kg, SpO2 94 %, unknown if currently breastfeeding.     Intake/Output Summary (Last 24 hours) at 11/08/2019 0525 Last data filed at 11/08/2019 0354 Gross per 24 hour  Intake 1909.09 ml  Output 800 ml  Net 1109.09 ml   Filed Weights   11/06/19 2355  Weight: 114.3 kg    Examination: GENERAL: in no acute distress CARDIAC: heart RRR.  PULMONARY:  Lung sounds clear to auscultation. NEURO: alert and oriented. Bilateral upper extremity weakness L>R. Decreased sensation of right ventral hand and entire left hand and arm extending to just below the shoulder.   Significant Diagnostic Tests:  3/22 MRI brain>multiple foci of hyperintensity in the supratentorial periventricular greater than subcortical white matter. Most involvement in the right temporal lobe. Also involvement in the bilateral middle cerebellar peduncles and possible left cerebellar hemisphere. Minority of foci with T1 hypointensity.  3/22 MRI C-Spine> abnormal hyperintensity of central cord at C3-C4  3/22 MRI T-Spine> hyperintensity at the left dorsal aspect of cord at T1. Abnormal signal without  enhancement at T2 and T3-4   Labs    CBC Latest Ref Rng & Units 11/07/2019 11/07/2019 08/07/2018  WBC 4.0 - 10.5 K/uL - 9.4 11.0(H)  Hemoglobin 12.0 - 15.0 g/dL 14.3 13.2 10.2(L)  Hematocrit 36.0 - 46.0 % 42.0 42.0 32.5(L)  Platelets 150 - 400 K/uL - 411(H) 252   BMP Latest Ref Rng & Units 11/07/2019 11/07/2019 11/07/2019  Glucose 70 - 99 mg/dL 504(HH) 240(H) 256(H)  BUN 6 - 20 mg/dL 15 13 11   Creatinine 0.44 - 1.00 mg/dL 0.89 0.50 0.64  Sodium 135 - 145 mmol/L 136 140 138  Potassium 3.5 - 5.1 mmol/L 4.7 3.7 3.7  Chloride 98 - 111 mmol/L 104 107 106  CO2 22 - 32 mmol/L 18(L) - 22  Calcium 8.9 - 10.3 mg/dL 8.6(L) - 9.0    Summary  Danielle Baker is a 24 yo female with PMH of type 1 DM who presented with left sided numbness and upper extremity weakness found to have brain and spinal cord lesions consistent with Multiple Sclerosis  Assessment & Plan:  Active Problems:   Uncontrolled type 1 diabetes mellitus (HCC)   Multiple sclerosis exacerbation (HCC)  Multiple Sclerosis-newly diagnosed Brain and spinal imaging indicate acute inflammatory/demyelinating disease involving periventricular region, cerebellum, C3-C4, and T1. Additionally abnormal signals likely indicate non-active lesions. Lesions separated by time and space along with patient's symptoms most consistent with multiple sclerosis.  Nausea and vertigo resolved this morning. Still experiencing weakness and numbness this morning however not surprising given minimal time since  steroids were started. Vitamin D deficiency--11 on admission TSH, B12 normal ACE, NMO antibodies pending Appreciate neurology's recommendations Plan 1g IV solumedrol--day #2/5 50,000U Vit D once weekly for 6-8w, then 800U daily thereafter. ramelton hs, prn ativan for sleeep Protonix 40mg  daily PT/OT  Type I DM Home medications: SSI with novolog, 60U levamir, 80U tuojeo A1C on admission 9.4 Plan: lantus 80U daily, 23U novolog with meals, SSI   Best  practice:  CODE STATUS: full Diet: regular DVT for prophylaxis: lovenox Dispo: pending steroid completion. Will need follow up with Dr. at discharge   Epimenio Foot, MD INTERNAL MEDICINE RESIDENT PGY-1 PAGER #: [phone] 11/08/19  5:25 AM

## 2019-11-08 NOTE — Progress Notes (Addendum)
Inpatient Diabetes Program Recommendations  AACE/ADA: New Consensus Statement on Inpatient Glycemic Control (2015)  Target Ranges:  Prepandial:   less than 140 mg/dL      Peak postprandial:   less than 180 mg/dL (1-2 hours)      Critically ill patients:  140 - 180 mg/dL   Lab Results  Component Value Date   GLUCAP 211 (H) 11/08/2019   HGBA1C 9.4 (H) 11/07/2019    Review of Glycemic Control Results for EMMI, WERTHEIM (MRN [id number]) as of 11/08/2019 09:58  Ref. Range 11/07/2019 23:40 11/08/2019 00:53 11/08/2019 01:53 11/08/2019 03:02 11/08/2019 06:00  Glucose-Capillary Latest Ref Range: 70 - 99 mg/dL 174 (H) 715 (H) 953 (H) 115 (H) 211 (H)   Diabetes history: DM type 1 (makes no insulin) Outpatient Diabetes medications: Toujeo U-300 80 units qhs, Humalog 23 units tid Current orders for Inpatient glycemic control: Lantus 80 units qd + Novolog 23 units tid meal coverage + Novolog correction 0-20 units tid  Inpatient Diabetes Program Recommendations:   Noted Lantus 80 units given @ 0015 and this am @ 0951. Requested a repeat CBG -secure chatted with RN Tonika Abdul-Mutakallim. Depending on amount patient eating, may need to decrease meal coverage and Lantus.  Thank you, Danielle Baker. Leeandre Nordling, RN, MSN, CDE  Diabetes Coordinator Inpatient Glycemic Control Team Team Pager [phone] (8am-5pm) 11/08/2019 10:13 AM

## 2019-11-08 NOTE — Evaluation (Signed)
Occupational Therapy Evaluation Patient Details Name: Danielle Baker MRN: [id number] DOB: November 14, 1995 Today's Date: 11/08/2019    History of Present Illness  Danielle Baker is a 24 y.o. female with past medical history of type 1 diabetes, tachycardia, obesity, hypoglycemia. Presented with dizziness, LUE weakness and LUE numbness, MRI consistent with MS.    Clinical Impression   Patient evaluated by Occupational Therapy with no further acute OT needs identified. All education has been completed and the patient has no further questions. See below for any follow-up Occupational Therapy or equipment needs. OT to sign off. Thank you for referral.      Follow Up Recommendations  No OT follow up    Equipment Recommendations  None recommended by OT    Recommendations for Other Services       Precautions / Restrictions Precautions Precautions: Fall Restrictions Weight Bearing Restrictions: No      Mobility Bed Mobility Overal bed mobility: Independent                Transfers Overall transfer level: Modified independent               General transfer comment: no LOB with sit<>stand, denied dizziness. Did later have dizziness with head turn stepping into tub    Balance Overall balance assessment: Modified Independent                                         ADL either performed or assessed with clinical judgement   ADL Overall ADL's : Modified independent                                       General ADL Comments: pt does have dizziness with bending over and advised to let someone else complete tub bath with children. pt educated on gaze stabilization.      Vision Baseline Vision/History: Wears glasses Wears Glasses: At all times       Perception     Praxis      Pertinent Vitals/Pain Pain Assessment: No/denies pain     Hand Dominance Right   Extremity/Trunk Assessment Upper Extremity Assessment Upper Extremity  Assessment: Defer to OT evaluation RUE Deficits / Details: reports numbness 3rd 4th 5th digit only (median and ulna nerve distribution) LUE Deficits / Details: reports entire hand arm are numb. reports numbness in thoracic area front and back wrapping around to midline LUE Sensation: decreased proprioception LUE Coordination: decreased fine motor   Lower Extremity Assessment Lower Extremity Assessment: Defer to PT evaluation   Cervical / Trunk Assessment Cervical / Trunk Assessment: Normal   Communication Communication Communication: No difficulties   Cognition Arousal/Alertness: Awake/alert Behavior During Therapy: WFL for tasks assessed/performed Overall Cognitive Status: Within Functional Limits for tasks assessed                                     General Comments  discussed EC in regards to MS and looking for community support    Exercises     Shoulder Instructions      Home Living Family/patient expects to be discharged to:: Private residence Living Arrangements: Spouse/significant other;Children;Other (Comment)(sister, 3 and 49 yo) Available Help at Discharge: Family;Available 24 hours/day Type of Home: [place]  Access: Stairs to enter [employer] of Steps: 3 Entrance Stairs-Rails: Can reach both Home Layout: Multi-level Alternate Level Stairs-Number of Steps: 7   Bathroom Shower/Tub: Tub/shower unit;Walk-in shower   Bathroom Toilet: Standard     Home Equipment: None   Additional Comments: sister living with them next couple of yrs per pt report, helps pt with her children. 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        OT Problem List:        OT Treatment/Interventions:      OT Goals(Current goals can be found in the care plan section) Acute Rehab OT  Goals Patient Stated Goal: return to work and caring for kids  OT Frequency:     Barriers to D/C:            Co-evaluation PT/OT/SLP Co-Evaluation/Treatment: Yes Reason for Co-Treatment: Complexity of the patient's impairments (multi-system involvement);For patient/therapist safety;To address functional/ADL transfers PT goals addressed during session: Mobility/safety with mobility;Balance OT goals addressed during session: ADL's and self-care;Strengthening/ROM      AM-PAC OT "6 Clicks" Daily Activity     Outcome Measure Help from another person eating meals?: None Help from another person taking care of personal grooming?: None Help from another person toileting, which includes using toliet, bedpan, or urinal?: None Help from another person bathing (including washing, rinsing, drying)?: None Help from another person to put on and taking off regular upper body clothing?: None Help from another person to put on and taking off regular lower body clothing?: None 6 Click Score: 24   End of Session Nurse Communication: Mobility status;Precautions  Activity Tolerance: Patient tolerated treatment well Patient left: in chair;with call bell/phone within reach  OT Visit Diagnosis: Unsteadiness on feet (R26.81)                Time: 8756-4332 OT Time Calculation (min): 18 min Charges:  OT General Charges $OT Visit: 1 Visit OT Evaluation $OT Eval Low Complexity: 1 Low   Brynn, OTR/L  Acute Rehabilitation Services Pager: [phone] Office: [phone] .   Mateo Flow 11/08/2019, 4:21 PM

## 2019-11-08 NOTE — Progress Notes (Deleted)
Patient personal items collected form the room. Patient discharged to inpatient rehab to [phone] via wheelchair. All questions answered. Report given to nurse.

## 2019-11-09 LAB — GLUCOSE, CAPILLARY
Glucose-Capillary: 191 mg/dL — ABNORMAL HIGH (ref 70–99)
Glucose-Capillary: 155 mg/dL — ABNORMAL HIGH (ref 70–99)
Glucose-Capillary: 155 mg/dL — ABNORMAL HIGH (ref 70–99)
Glucose-Capillary: 79 mg/dL (ref 70–99)

## 2019-11-09 LAB — ANGIOTENSIN CONVERTING ENZYME: Angiotensin-Converting Enzyme: 23 U/L (ref 14–82)

## 2019-11-09 LAB — NEUROMYELITIS OPTICA AUTOAB, IGG: NMO-IgG: <1.5 U/mL (ref 0.0–3.0)

## 2019-11-09 MED ORDER — ONDANSETRON HCL 4 MG PO TABS: 4.0000 mg | ORAL_TABLET | Freq: Three times a day (TID) | ORAL | Status: DC | PRN

## 2019-11-09 MED ORDER — ONDANSETRON HCL 4 MG/2ML IJ SOLN
4.0000 mg | Freq: Three times a day (TID) | INTRAMUSCULAR | Status: DC | PRN
  Administered 2019-11-10: 4 mg via INTRAVENOUS
  Filled 2019-11-09: qty 2

## 2019-11-09 NOTE — Plan of Care (Signed)
Brief no charge note Patient seen and examined No changes in exam Continue steroids for total of 5 days. Pending neuromyelitis optica antibodies.   -- Milon Dikes, MD Neurology

## 2019-11-09 NOTE — Progress Notes (Signed)
   NAME:  Danielle Baker, MRN:  [id number], DOB:  December 01, 1995, LOS: 2 ADMISSION DATE:  11/06/2019  Subjective/Interm hx  No overnight events. VSS parasthesias and weakness still present but not worse. Also endorses some positional vertigo this morning.  Objective   Blood pressure 120/82, pulse 78, temperature 98.3 F (36.8 C), temperature source Oral, resp. rate 18, height 5\' 4"  (1.626 m), weight 114.3 kg, SpO2 98 %, unknown if currently breastfeeding.     Intake/Output Summary (Last 24 hours) at 11/09/2019 0549 Last data filed at 11/09/2019 0300 Gross per 24 hour  Intake 1808.13 ml  Output --  Net 1808.13 ml   Filed Weights   11/06/19 2355  Weight: 114.3 kg    Examination: GENERAL: in no acute distress CARDIAC: heart RRR.  PULMONARY:  Lung sounds clear to auscultation. NEURO: a/o. 5/5 strength in bilateral upper and lower extremities.  Significant Diagnostic Tests:  3/22 MRI brain>multiple foci of hyperintensity in the supratentorial periventricular greater than subcortical white matter. Most involvement in the right temporal lobe. Also involvement in the bilateral middle cerebellar peduncles and possible left cerebellar hemisphere. Minority of foci with T1 hypointensity.  3/22 MRI C-Spine> abnormal hyperintensity of central cord at C3-C4  3/22 MRI T-Spine> hyperintensity at the left dorsal aspect of cord at T1. Abnormal signal without enhancement at T2 and T3-4   Labs    CBC Latest Ref Rng & Units 11/08/2019 11/07/2019 11/07/2019  WBC 4.0 - 10.5 K/uL 9.6 - 9.4  Hemoglobin 12.0 - 15.0 g/dL 11/09/2019 56.3 89.3  Hematocrit 36.0 - 46.0 % 40.7 42.0 42.0  Platelets 150 - 400 K/uL 409(H) - 411(H)   BMP Latest Ref Rng & Units 11/08/2019 11/07/2019 11/07/2019  Glucose 70 - 99 mg/dL 11/09/2019) 287(G) 811(XB)  BUN 6 - 20 mg/dL 12 15 13   Creatinine 0.44 - 1.00 mg/dL 262(M 3.55  Sodium 135 - 145 mmol/L 139 136 140  Potassium 3.5 - 5.1 mmol/L 3.9 4.7 3.7  Chloride 98 - 111 mmol/L 109 104 107   CO2 22 - 32 mmol/L 22 18(L) -  Calcium 8.9 - 10.3 mg/dL 9.74) 1.63) -   ACE enzyme normal. NMO antibiody> Vitamin D--11  Summary  8.4(T is a 24 yo female with PMH of type 1 DM who presented with left sided numbness and upper extremity weakness found to have brain and spinal cord lesions consistent with Multiple Sclerosis  Assessment & Plan:  Active Problems:   Uncontrolled type 1 diabetes mellitus (HCC)   Multiple sclerosis exacerbation (HCC)  Multiple Sclerosis-newly diagnosed. Vitamin D deficiency NMO antibodies pending Appreciate neurology's recommendations Plan 1g IV solumedrol--day #3/5. Compazine for vertigo 50,000U Vit D once weekly for 6-8w, then 800U daily thereafter. ramelton hs, prn ativan for sleeep Protonix 40mg  daily PT/OT  Type I DM Home medications: SSI with novolog, 60U levamir, 80U tuojeo A1C on admission 9.4 Glucoses still somewhat elevated. lantus increased to 90U, meal time insulin increased to 25U yesterday. Plan: lantus 90U daily, 25U novolog with meals, SSI. Suspect we will need to continue to increase insulin in the mean time until steroids are complete.  Best practice:  CODE STATUS: full Diet: diabetic DVT for prophylaxis: lovenox Dispo: pending steroid completion. Will need follow up with Dr. [name] at discharge   25, MD INTERNAL MEDICINE RESIDENT PGY-1 PAGER #: [phone] 11/09/19  5:49 AM

## 2019-11-10 LAB — GLUCOSE, CAPILLARY
Glucose-Capillary: 137 mg/dL — ABNORMAL HIGH (ref 70–99)
Glucose-Capillary: 187 mg/dL — ABNORMAL HIGH (ref 70–99)
Glucose-Capillary: 131 mg/dL — ABNORMAL HIGH (ref 70–99)
Glucose-Capillary: 181 mg/dL — ABNORMAL HIGH (ref 70–99)
Glucose-Capillary: 229 mg/dL — ABNORMAL HIGH (ref 70–99)

## 2019-11-10 MED ORDER — PANTOPRAZOLE SODIUM 40 MG PO TBEC: 40.0000 mg | DELAYED_RELEASE_TABLET | ORAL | Status: DC

## 2019-11-10 NOTE — Progress Notes (Signed)
Inpatient Diabetes Program Recommendations  AACE/ADA: New Consensus Statement on Inpatient Glycemic Control (2015)  Target Ranges:  Prepandial:   less than 140 mg/dL      Peak postprandial:   less than 180 mg/dL (1-2 hours)      Critically ill patients:  140 - 180 mg/dL   Lab Results  Component Value Date   GLUCAP 187 (H) 11/10/2019   HGBA1C 9.4 (H) 11/07/2019    Review of Glycemic Control Results for Danielle Baker, Danielle Baker (MRN [id number]) as of 11/10/2019 12:01  Ref. Range 11/09/2019 12:15 11/09/2019 16:55 11/09/2019 21:13 11/10/2019 06:10 11/10/2019 08:53  Glucose-Capillary Latest Ref Range: 70 - 99 mg/dL 916 (H) 945 (H) 79 038 (H) 187 (H)   Diabetes history:DM type 1 (makes no insulin) Outpatient Diabetes medications:ToujeoU-30080 units qhs, Humalog 23 units tid Current orders for Inpatient glycemic control: Lantus 90 units qd + Novolog 25 units tid meal coverage + Novolog correction 0-20 units tid  Inpatient Diabetes Program Recommendations:   -Decrease Novolog correction to moderate tid + hs 0-5 units  Thank you, Darel Hong E. Necola Bluestein, RN, MSN, CDE  Diabetes Coordinator Inpatient Glycemic Control Team Team Pager [phone] (8am-5pm) 11/10/2019 12:02 PM

## 2019-11-10 NOTE — Plan of Care (Signed)
  Problem: Education: Goal: Ability to describe self-care measures that may prevent or decrease complications (Diabetes Survival Skills Education) will improve Outcome: Progressing   Problem: Coping: Goal: Ability to adjust to condition or change in health will improve Outcome: Progressing   Problem: Nutritional: Goal: Progress toward achieving an optimal weight will improve Outcome: Progressing   

## 2019-11-10 NOTE — Consult Note (Addendum)
   [facility] CM Inpatient Consult   11/10/2019  Danielle Baker 01-22-1996 [id number]   11/11/2019 Follow up: Transition  Spoke with patient via hospital phone for post hospital follow up reminder, HIPAA, verified.  She states her best number is [phone]  [employer] [[facility]] Status: pending  Patient is currently a pending active status in [employer] [[facility]] Care Management for post hospital follow up with [facility] plan.  Patient will be followed by a [facility] RN Care Coordinator for support and benefits needs.  Our community based plan of care will focus on disease management and community resource support for post hospital follow up needs.  For questions or needs please contact:  Charlesetta Shanks, RN BSN CCM [facility]  [phone] business mobile phone Toll free office [phone]  Fax number: [phone] [email or web address]

## 2019-11-10 NOTE — Progress Notes (Signed)
   NAME:  Danielle Baker, MRN:  [id number], DOB:  [birth year], LOS: 3 ADMISSION DATE:  11/06/2019  Subjective/Interm hx  No overnight events. VSS Notes no significant changes in symptoms.  Objective   Blood pressure 136/81, pulse (!) 58, temperature 97.9 F (36.6 C), temperature source Oral, resp. rate 18, height 5\' 4"  (1.626 m), weight 114.3 kg, SpO2 100 %, unknown if currently breastfeeding.     Intake/Output Summary (Last 24 hours) at 11/10/2019 0459 Last data filed at 11/09/2019 1200 Gross per 24 hour  Intake 720 ml  Output --  Net 720 ml   Filed Weights   11/06/19 2355  Weight: 114.3 kg    Examination: GENERAL: in no acute distress CARDIAC: heart RRR.  PULMONARY:  Lung sounds clear to auscultation. NEURO: a/o. 5/5 strength in bilateral upper and lower extremities.  Significant Diagnostic Tests:  3/22 MRI brain>multiple foci of hyperintensity in the supratentorial periventricular greater than subcortical white matter. Most involvement in the right temporal lobe. Also involvement in the bilateral middle cerebellar peduncles and possible left cerebellar hemisphere. Minority of foci with T1 hypointensity.  3/22 MRI C-Spine> abnormal hyperintensity of central cord at C3-C4  3/22 MRI T-Spine> hyperintensity at the left dorsal aspect of cord at T1. Abnormal signal without enhancement at T2 and T3-4   Labs    CBC Latest Ref Rng & Units 11/08/2019 11/07/2019 11/07/2019  WBC 4.0 - 10.5 K/uL 9.6 - 9.4  Hemoglobin 12.0 - 15.0 g/dL 11/09/2019 53.9 76.7  Hematocrit 36.0 - 46.0 % 40.7 42.0 42.0  Platelets 150 - 400 K/uL 409(H) - 411(H)   BMP Latest Ref Rng & Units 11/08/2019 11/07/2019 11/07/2019  Glucose 70 - 99 mg/dL 11/09/2019) 937(T) 024(OX)  BUN 6 - 20 mg/dL 12 15 13   Creatinine 0.44 - 1.00 mg/dL 735(H 2.99  Sodium 135 - 145 mmol/L 139 136 140  Potassium 3.5 - 5.1 mmol/L 3.9 4.7 3.7  Chloride 98 - 111 mmol/L 109 104 107  CO2 22 - 32 mmol/L 22 18(L) -  Calcium 8.9 - 10.3 mg/dL 2.42)  6.83) -   ACE enzyme normal. NMO antibiody> Vitamin D--11  Summary  4.1(D is a 24 yo female with PMH of type 1 DM who presented with left sided numbness and upper extremity weakness found to have brain and spinal cord lesions consistent with Multiple Sclerosis  Assessment & Plan:  Active Problems:   Uncontrolled type 1 diabetes mellitus (HCC)   Multiple sclerosis exacerbation (HCC)  Multiple Sclerosis-newly diagnosed. Vitamin D deficiency NMO antibodies negative Appreciate neurology's recommendations Plan 1g IV solumedrol--day #4/5.  50,000U Vit D once weekly for 6-8w, then 800U daily thereafter. ramelton hs, prn ativan for sleeep Protonix 40mg  daily PT/OT  Type I DM A1C on admission 9.4 Glucoses improved. Plan: lantus 90U daily, 25U novolog with meals, SSI.  Best practice:  CODE STATUS: full Diet: diabetic DVT for prophylaxis: lovenox Dispo: pending steroid completion. Will need follow up with Dr. [name] at discharge   24, MD INTERNAL MEDICINE RESIDENT PGY-1 PAGER #: [phone] 11/10/19  4:59 AM

## 2019-11-11 DIAGNOSIS — G35 Multiple sclerosis: Principal | ICD-10-CM

## 2019-11-11 LAB — GLUCOSE, CAPILLARY
Glucose-Capillary: 81 mg/dL (ref 70–99)
Glucose-Capillary: 210 mg/dL — ABNORMAL HIGH (ref 70–99)
Glucose-Capillary: 162 mg/dL — ABNORMAL HIGH (ref 70–99)

## 2019-11-11 MED ORDER — VITAMIN D (ERGOCALCIFEROL) 1.25 MG (50000 UNIT) PO CAPS: 50000.0000 [IU] | ORAL_CAPSULE | ORAL | 0 refills | Status: AC

## 2019-11-11 MED FILL — VIT D2 1.25 MG (50,000 UNIT: 1.25 MG | 28 days supply | Qty: 4 | Fill #0

## 2019-11-11 NOTE — TOC Transition Note (Signed)
Transition of Care [facility]) - CM/SW Discharge Note   Patient Details  Name: Danielle Baker MRN: [id number] Date of Birth: Apr 06, 1996  Transition of Care [facility]) CM/SW Contact:  Kermit Balo, RN Phone Number: 11/11/2019, 1:57 PM   Clinical Narrative:    Pt discharging home with self care. No f/u per PT/OT and no DME needs.  Pt has transportation home.    Final next level of care: Home/Self Care Barriers to Discharge: No Barriers Identified   Patient Goals and CMS Choice        Discharge Placement                       Discharge Plan and Services                                     Social Determinants of Health (SDOH) Interventions     Readmission Risk Interventions No flowsheet data found.

## 2019-11-11 NOTE — Progress Notes (Signed)
   NAME:  Danielle Baker, MRN:  [id number], DOB:  Dec 23, 1995, LOS: 4 ADMISSION DATE:  11/06/2019  Subjective/Interm hx  No overnight events. VSS Discussed plans for discharge today follow last steroid infusion and necessity of follow up with Dr. Sherilyn Dacosta, MS specialist  Objective   Blood pressure (!) 144/87, pulse (!) 46, temperature 97.7 F (36.5 C), temperature source Oral, resp. rate 16, height 5\' 4"  (1.626 m), weight 114.3 kg, SpO2 99 %, unknown if currently breastfeeding.     Intake/Output Summary (Last 24 hours) at 11/11/2019 0508 Last data filed at 11/10/2019 1500 Gross per 24 hour  Intake 355.94 ml  Output -  Net 355.94 ml   Filed Weights   11/06/19 2355  Weight: 114.3 kg    Examination: GENERAL: in no acute distress CARDIAC: heart RRR.  PULMONARY:  Lung sounds clear to auscultation. NEURO: a/o. 5/5 strength in bilateral upper and lower extremities.  Significant Diagnostic Tests:  3/22 MRI brain>multiple foci of hyperintensity in the supratentorial periventricular greater than subcortical white matter. Most involvement in the right temporal lobe. Also involvement in the bilateral middle cerebellar peduncles and possible left cerebellar hemisphere. Minority of foci with T1 hypointensity.  3/22 MRI C-Spine> abnormal hyperintensity of central cord at C3-C4  3/22 MRI T-Spine> hyperintensity at the left dorsal aspect of cord at T1. Abnormal signal without enhancement at T2 and T3-4   Labs    CBC Latest Ref Rng & Units 11/08/2019 11/07/2019 11/07/2019  WBC 4.0 - 10.5 K/uL 9.6 - 9.4  Hemoglobin 12.0 - 15.0 g/dL 11/09/2019 42.3 53.6  Hematocrit 36.0 - 46.0 % 40.7 42.0 42.0  Platelets 150 - 400 K/uL 409(H) - 411(H)   BMP Latest Ref Rng & Units 11/08/2019 11/07/2019 11/07/2019  Glucose 70 - 99 mg/dL 11/09/2019) 315(Q) 008(QP)  BUN 6 - 20 mg/dL 12 15 13   Creatinine 0.44 - 1.00 mg/dL 619(J 0.93  Sodium 135 - 145 mmol/L 139 136 140  Potassium 3.5 - 5.1 mmol/L 3.9 4.7 3.7  Chloride 98 - 111  mmol/L 109 104 107  CO2 22 - 32 mmol/L 22 18(L) -  Calcium 8.9 - 10.3 mg/dL 2.67) 1.24) -   ACE enzyme normal. NMO antibiody> negative Vitamin D--11  Summary  5.8(K is a 24 yo female with PMH of type 1 DM who presented with left sided numbness and upper extremity weakness found to have brain and spinal cord lesions consistent with Multiple Sclerosis  Assessment & Plan:  Active Problems:   Uncontrolled type 1 diabetes mellitus (HCC)   Multiple sclerosis exacerbation (HCC)  Multiple Sclerosis-newly diagnosed. Vitamin D deficiency NMO antibodies negative Appreciate neurology's recommendations Plan 1g IV solumedrol--day #5/5.  50,000U Vit D once weekly for 6-8w, then 800U daily thereafter. ramelton hs, prn ativan for sleeep Protonix 40mg  daily PT/OT Stable for discharge  Type I DM A1C on admission 9.4 Glucoses improved. Plan: lantus 90U daily, 25U novolog with meals, SSI. Willl transition to home medications at discharge  Best practice:  CODE STATUS: full Diet: diabetic DVT for prophylaxis: lovenox Dispo: pending final steroid infusion today. Will need follow up with Dr. [name] at discharge   25, MD INTERNAL MEDICINE RESIDENT PGY-1 PAGER #: [phone] 11/11/19  5:08 AM

## 2019-11-11 NOTE — Plan of Care (Signed)
Patient stable, discussed POC with patient, agreeable with plan, denies question/concerns at this time.  

## 2019-11-11 NOTE — Discharge Instructions (Signed)
It was a pleasure taking care of you during this hospitalization. Just a couple of friendly reminders and updates for you. -Please call and arrange a post hospital  follow up appointment with Dr. Epimenio Foot (MS specialist) in the neurology clinic.  -For you Vitamin D deficiency, please take one 50,000U capsule a week for 6-8w, then 800U daily thereafter.  Thank  You for placing your trust in the Internal Medicine team and we wish you the very best!

## 2019-11-11 NOTE — Progress Notes (Signed)
Neurology Progress Note   S:// Seen and examined.  Reports some improvement in numbness.   O:// Current vital signs: BP (!) 154/84 (BP Location: Right Arm)   Pulse (!) 53   Temp 98.1 F (36.7 C) (Oral)   Resp 18   Ht 5\' 4"  (1.626 m)   Wt 114.3 kg   SpO2 98%   BMI 43.26 kg/m  Vital signs in last 24 hours: Temp:  [97.7 F (36.5 C)-98.6 F (37 C)] 98.1 F (36.7 C) (03/26 0853) Pulse Rate:  [45-63] 53 (03/26 0853) Resp:  [16-20] 18 (03/26 0853) BP: (138-154)/(80-95) 154/84 (03/26 0853) SpO2:  [95 %-99 %] 98 % (03/26 0853) Neurological exam Awake alert oriented x3 No dysarthria Pupils equal round react light, extraocular movements intact, visual fields full, face symmetric, tongue paramidline. Motor exam: No drift. Sensory exam: Intact light touch Coordination: No dysmetria   Medications  Current Facility-Administered Medications:  .  acetaminophen (TYLENOL) tablet 650 mg, 650 mg, Oral, Q6H PRN, 650 mg at 11/10/19 8416 **OR** acetaminophen (TYLENOL) suppository 650 mg, 650 mg, Rectal, Q6H PRN, Winfrey, William B, MD .  dextrose 50 % solution 0-50 mL, 0-50 mL, Intravenous, PRN, Shan Levans, William B, MD .  enoxaparin (LOVENOX) injection 40 mg, 40 mg, Subcutaneous, Q24H, Katherine Roan, MD, 40 mg at 11/10/19 2213 .  escitalopram (LEXAPRO) tablet 20 mg, 20 mg, Oral, Daily, Guadlupe Spanish B, MD, 20 mg at 11/11/19 1040 .  insulin aspart (novoLOG) injection 0-20 Units, 0-20 Units, Subcutaneous, TID WC, Maudie Mercury, MD, 4 Units at 11/10/19 1705 .  insulin aspart (novoLOG) injection 25 Units, 25 Units, Subcutaneous, TID WC, Darrick Meigs, Rylee, MD, 25 Units at 11/11/19 [phone] .  insulin glargine (LANTUS) injection 90 Units, 90 Units, Subcutaneous, Daily, Darrick Meigs, Rylee, MD, 90 Units at 11/11/19 1040 .  methylPREDNISolone sodium succinate (SOLU-MEDROL) 1,000 mg in sodium chloride 0.9 % 50 mL IVPB, 1,000 mg, Intravenous, Daily, Volanda Napoleon, PA-C, Stopped at 11/10/19 1944 .   ondansetron (ZOFRAN) injection 4 mg, 4 mg, Intravenous, Q8H PRN, 4 mg at 11/10/19 1941 **OR** ondansetron (ZOFRAN) tablet 4 mg, 4 mg, Oral, Q8H PRN, Seawell, Jaimie A, DO .  pantoprazole (PROTONIX) EC tablet 40 mg, 40 mg, Oral, Q24H, Hoffman, Erik C, DO .  ramelteon (ROZEREM) tablet 8 mg, 8 mg, Oral, QHS, Katherine Roan, MD, 8 mg at 11/10/19 2212 .  Vitamin D (Ergocalciferol) (DRISDOL) capsule 50,000 Units, 50,000 Units, Oral, Q7 days, Mitzi Hansen, MD, 50,000 Units at 11/08/19 1430 Labs CBC    Component Value Date/Time   WBC 9.6 11/08/2019 0402   RBC 4.75 11/08/2019 0402   HGB 12.8 11/08/2019 0402   HCT 40.7 11/08/2019 0402   PLT 409 (H) 11/08/2019 0402   MCV 85.7 11/08/2019 0402   MCH 26.9 11/08/2019 0402   MCHC 31.4 11/08/2019 0402   RDW 14.1 11/08/2019 0402   LYMPHSABS 3.9 11/07/2019 0008   MONOABS 0.4 11/07/2019 0008   EOSABS 0.4 11/07/2019 0008   BASOSABS 0.1 11/07/2019 0008    CMP     Component Value Date/Time   NA 139 11/08/2019 0402   K 3.9 11/08/2019 0402   CL 109 11/08/2019 0402   CO2 22 11/08/2019 0402   GLUCOSE 141 (H) 11/08/2019 0402   BUN 12 11/08/2019 0402   CREATININE 0.58 11/08/2019 0402   CREATININE 0.62 03/22/2015 1210   CALCIUM 8.5 (L) 11/08/2019 0402   PROT 6.3 (L) 11/08/2019 0402   ALBUMIN 3.0 (L) 11/08/2019 0402   AST 38 11/08/2019  0402   ALT 34 11/08/2019 0402   ALKPHOS 97 11/08/2019 0402   BILITOT 0.7 11/08/2019 0402   GFRNONAA >60 11/08/2019 0402   GFRNONAA >89 03/22/2015 1210   GFRAA >60 11/08/2019 0402   GFRAA >89 03/22/2015 1210   Neuromyelitis optica antibodies negative. Vitamin D 11.6  Imaging I have reviewed images in epic and the results pertinent to this consultation are: MRI of the brain, cervical and thoracic spine with moderate burden of white matter lesions with abnormal enhancement, also in the cervical spine as well as upper thoracic spine.  Consistent with MS  Assessment:  Multiple sclerosis (new diagnosis ) with  acute exacerbation Vitamin D deficiency Type 1 diabetes  Recommendations: Completed 5 days of steroids (after the dose that is being given now). Follow-up with [facility] neurology as an outpatient in the next 2 to 4 weeks-Dr. Epimenio Foot Supplement vitamin D for goal levels more than 50. PT OT Blood sugar management per primary team Inpatient neurology will be available as needed.  Please call with questions.   -- Milon Dikes, MD [facility] Pager: [phone] If 7pm to 7am, please call on call as listed on AMION.

## 2019-11-12 NOTE — Discharge Summary (Signed)
Name: Danielle Baker MRN: [id number] DOB: Jan 30, 1996 24 y.o. PCP: Marco Collie, MD  Date of Admission: 11/06/2019 11:54 PM Date of Discharge: 11/11/2019 Attending Physician: No att. providers found  Discharge Diagnosis: 1. Newly diagnosed multiple sclerosis  Discharge Medications: Allergies as of 11/11/2019      Reactions   Enalapril Other (See Comments)   "Weak and blacked out" couldn't stand up. Pt states it dropped her BP too low    Morphine And Related Rash      Medication List    STOP taking these medications   insulin aspart 100 UNIT/ML injection Commonly known as: novoLOG   insulin detemir 100 UNIT/ML injection Commonly known as: LEVEMIR   oxyCODONE 5 MG immediate release tablet Commonly known as: Oxy IR/ROXICODONE     TAKE these medications   escitalopram 20 MG tablet Commonly known as: LEXAPRO Take 20 mg by mouth daily.   HumaLOG KwikPen 100 UNIT/ML KwikPen Generic drug: insulin lispro Inject 23 Units into the skin in the morning, at noon, and at bedtime.   Toujeo Max SoloStar 300 UNIT/ML Solostar Pen Generic drug: insulin glargine (2 Unit Dial) Inject 80 Units into the skin at bedtime.   Vitamin D (Ergocalciferol) 1.25 MG (50000 UNIT) Caps capsule Commonly known as: DRISDOL Take 1 capsule (50,000 Units total) by mouth every 7 (seven) days. Start taking on: November 15, 2019       Disposition and follow-up:   Danielle Baker was discharged from [facility] in Stable condition.  At the hospital follow up visit please address:  1.  Multiple sclerosis. Diagnosed via MRI findings on admission. Underwent 5d course of burst steroids. Seen by Dr. Malen Gauze during hospitalization. Will need follow up with Dr. Felecia Shelling for discussion regarding maintenance medications. Given the burst steroid therapy and her T1DM, please evaluate how blood sugars have been since coming off therapy.  Follow-up Appointments: Follow-up Information    Sater, Nanine Means,  MD Follow up.   Specialty: Neurology Contact information: [place] [address] [phone]        Marco Collie, MD Follow up.   Specialty: Family Medicine Contact information: [address] [place] [address] [phone]           Hospital Course: Danielle Baker is a 24 yo female with PMH of T1DM who presented to [facility] on 11/06/19 for weakness and numbness of her LUE. Pt noted that symptoms had began about 3w prior with a prickling sensation over her chest.  Head imaging in the ED revealed demyelinating signs in the periventricular, cerebellar, cervical and thoracic spine, consistant with multiple sclerosis. Neurology was consulted and she was placed on 1g solumedrol daily x5d. At time of discharge, she had noted a little improvement in symptoms.  Hospitalization was complicated by elevated CBGs and TIDM. She had increased insulin requirements and blood sugars improved. On day of discharge, discussed that blood sugars should likely improve over the coming days since that was her last day of steroids. She noted that she would call and speak with her endocrinologist regarding changing her home doses to adjust for hyperglycemia.   Discharge Vitals:   BP (!) 156/95 (BP Location: Left Arm)   Pulse (!) 58   Temp 97.9 F (36.6 C) (Oral)   Resp 18   Ht 5\' 4"  (1.626 m)   Wt 114.3 kg   SpO2 95%   BMI 43.26 kg/m   Pertinent Labs, Studies, and Procedures:  3/22  MRI brain>multiple foci of hyperintensity in the supratentorial periventricular greater than subcortical white matter. Most involvement in the right temporal lobe. Also involvement in the bilateral middle cerebellar peduncles and possible left cerebellar hemisphere. Minority of foci with T1 hypointensity.  3/22 MRI C-Spine> abnormal hyperintensity of central cord at C3-C4  3/22 MRI T-Spine> hyperintensity at the left dorsal aspect of cord at T1. Abnormal signal without enhancement at T2  and T3-4  NMO antibodies negative.  Discharge Instructions: Discharge Instructions    Ambulatory referral to Neurology   Complete by: As directed    An appointment is requested in approximately: 2 weeks New diagnosis MS      Signed: Elige Radon, MD 11/12/2019, 11:07 AM   Pager: [phone]

## 2019-11-14 ENCOUNTER — Encounter: Payer: Self-pay | Admitting: *Deleted

## 2019-11-14 ENCOUNTER — Other Ambulatory Visit: Payer: Self-pay | Admitting: *Deleted

## 2019-11-14 NOTE — Patient Outreach (Signed)
[facility]) Care Management  11/14/2019  Danielle Baker 11-23-1995 [id number]   Transition of care call/case closure   Referral received:11/08/19 Initial outreach:11/14/19 Insurance: [employer]    Subjective: Initial successful telephone call to patient's preferred number in order to complete transition of care assessment; 2 HIPAA identifiers verified. Explained purpose of call and completed transition of care assessment.  Danielle Baker states that she is tired , just doesn't feel good. She complains of having some vertigo, weakness in left. 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 She discussed having not having much of appetite but she is trying some foods just taste funny. She reports A1c elevated since having a baby on last year. She reports recent adjustments insulin plan by endocrinologist, Dr. Suzan Baker and her Alc has recently come down form 10 to 9.4.  She discussed blood sugars reading have been 70 to 200 since being at home, reports low reading due to sleeping a little later in the morning before getting up to eat. Reviewed low blood sugar reading symptoms and teachback on treating.  Patient spouse and her sister  are assisting with her  recovery.  She discussed  ongoing health issues with Diabetes type 1,she reports that she has started process of completing health assessment in Active health management and reading about the condition management program. She will review further and consider enrolling.   She does  not have the hospital indemnity, she has started process for [employer]. She usees a  [facility] outpatient pharmacy at [facility] outpatient pharmacy.  Objective:  Danielle Baker  was hospitalized at [facility] for symptoms of decreased sensation , weakness left arm, New Diagnosis of Multiple Sclerosis from 3/22-/11/11/19   Comorbidities include: Type 1  Diabetes, obesity.  He/She was discharged to home on 11/11/19 without the need for home health services or DME.   Assessment:  Patient voices good understanding of all discharge instructions.  See transition of care flowsheet for assessment details.   Plan:  Reviewed hospital discharge diagnosis of New Diagnosis of Multiple Sclerosis    and discharge treatment plan using hospital discharge instructions, assessing medication adherence, reviewing problems requiring provider notification, and discussing the importance of follow up with surgeon, primary care provider and/or specialists as directed. Reviewed [place] healthy lifestyle program information to receive discounted premium for  2022  Step 1: Get annual physical between August 18, 2018 and February 16, 2020; Step 2: Complete your health assessment between August 19, 2019 and April 18, 2020 at [email or web address] Step 3:Identify your current health status and complete the corresponding action step between January 1, and April 18, 2020.     No ongoing care management needs identified so will close case to [facility] Care Management services and route successful outreach letter with [facility] Care Management pamphlet and 24 Hour Nurse Line Magnet to [employer] Care Management clinical pool to be mailed to patient's home address.  Thanked patient for their services to [facility].   Danielle Garibaldi, RN, BSN  [facility] Care Management,Care Management Coordinator  [phone]- Mobile [phone]- Toll Free Main Office

## 2019-11-16 MED FILL — ESCITALOPRAM 20 MG TABLET: 20 | 30 days supply | Qty: 30 | Fill #1

## 2019-11-16 MED FILL — HUMALOG 100 UNITS/ML KWIKPE: 100 | 30 days supply | Qty: 27 | Fill #1

## 2019-11-21 ENCOUNTER — Encounter (HOSPITAL_COMMUNITY): Payer: Self-pay

## 2019-11-21 ENCOUNTER — Inpatient Hospital Stay (HOSPITAL_COMMUNITY)
Admission: EM | Admit: 2019-11-21 | Discharge: 2019-11-24 | DRG: 059 | Disposition: A | Discharge status: Home / Self Care | Payer: No Typology Code available for payment source | Attending: Internal Medicine | Admitting: Internal Medicine

## 2019-11-21 ENCOUNTER — Other Ambulatory Visit: Payer: Self-pay

## 2019-11-21 DIAGNOSIS — G35A Relapsing-remitting multiple sclerosis: Secondary | ICD-10-CM | POA: Diagnosis present

## 2019-11-21 DIAGNOSIS — Z20822 Contact with and (suspected) exposure to covid-19: Secondary | ICD-10-CM | POA: Diagnosis present

## 2019-11-21 DIAGNOSIS — H538 Other visual disturbances: Secondary | ICD-10-CM | POA: Diagnosis present

## 2019-11-21 DIAGNOSIS — G35 Multiple sclerosis: Secondary | ICD-10-CM | POA: Diagnosis not present

## 2019-11-21 DIAGNOSIS — Z6841 Body Mass Index (BMI) 40.0 and over, adult: Secondary | ICD-10-CM

## 2019-11-21 DIAGNOSIS — E1065 Type 1 diabetes mellitus with hyperglycemia: Secondary | ICD-10-CM | POA: Diagnosis present

## 2019-11-21 DIAGNOSIS — Z794 Long term (current) use of insulin: Secondary | ICD-10-CM

## 2019-11-21 DIAGNOSIS — R296 Repeated falls: Secondary | ICD-10-CM | POA: Diagnosis present

## 2019-11-21 LAB — BASIC METABOLIC PANEL
Anion gap: 16 — ABNORMAL HIGH (ref 5–15)
BUN: 13 mg/dL (ref 6–20)
CO2: 20 mmol/L — ABNORMAL LOW (ref 22–32)
Calcium: 9 mg/dL (ref 8.9–10.3)
Chloride: 101 mmol/L (ref 98–111)
Creatinine, Ser: 0.86 mg/dL (ref 0.44–1.00)
GFR calc Af Amer: >60 mL/min (ref >60)
GFR calc non Af Amer: >60 mL/min (ref >60)
Glucose, Bld: 305 mg/dL — ABNORMAL HIGH (ref 70–99)
Potassium: 4.2 mmol/L (ref 3.5–5.1)
Sodium: 137 mmol/L (ref 135–145)

## 2019-11-21 LAB — CBC WITH DIFFERENTIAL/PLATELET
Abs Immature Granulocytes: 0.06 10*3/uL (ref 0.00–0.07)
Basophils Absolute: 0 10*3/uL (ref 0.0–0.1)
Basophils Relative: 0 %
Eosinophils Absolute: 0.1 10*3/uL (ref 0.0–0.5)
Eosinophils Relative: 1 %
HCT: 43.3 % (ref 36.0–46.0)
Hemoglobin: 13.3 g/dL (ref 12.0–15.0)
Immature Granulocytes: 0 %
Lymphocytes Relative: 32 %
Lymphs Abs: 4.4 10*3/uL — ABNORMAL HIGH (ref 0.7–4.0)
MCH: 26.8 pg (ref 26.0–34.0)
MCHC: 30.7 g/dL (ref 30.0–36.0)
MCV: 87.1 fL (ref 80.0–100.0)
Monocytes Absolute: 0.6 10*3/uL (ref 0.1–1.0)
Monocytes Relative: 4 %
Neutro Abs: 8.5 10*3/uL — ABNORMAL HIGH (ref 1.7–7.7)
Neutrophils Relative %: 63 %
Platelets: 348 10*3/uL (ref 150–400)
RBC: 4.97 MIL/uL (ref 3.87–5.11)
RDW: 13.6 % (ref 11.5–15.5)
WBC: 13.8 10*3/uL — ABNORMAL HIGH (ref 4.0–10.5)
nRBC: 0 % (ref 0.0–0.2)

## 2019-11-21 NOTE — ED Triage Notes (Signed)
Pt arrives to ED w/ c/o MS symptoms. Pt c/o back pain, numbness in lower extremities. Pt reports 8/10 pain.

## 2019-11-22 ENCOUNTER — Telehealth: Payer: Self-pay | Admitting: *Deleted

## 2019-11-22 ENCOUNTER — Emergency Department (HOSPITAL_COMMUNITY): Payer: No Typology Code available for payment source

## 2019-11-22 DIAGNOSIS — Z20822 Contact with and (suspected) exposure to covid-19: Secondary | ICD-10-CM | POA: Diagnosis present

## 2019-11-22 DIAGNOSIS — Z888 Allergy status to other drugs, medicaments and biological substances status: Secondary | ICD-10-CM

## 2019-11-22 DIAGNOSIS — Z885 Allergy status to narcotic agent status: Secondary | ICD-10-CM

## 2019-11-22 DIAGNOSIS — E1069 Type 1 diabetes mellitus with other specified complication: Secondary | ICD-10-CM | POA: Diagnosis not present

## 2019-11-22 DIAGNOSIS — G35 Multiple sclerosis: Principal | ICD-10-CM

## 2019-11-22 DIAGNOSIS — K859 Acute pancreatitis without necrosis or infection, unspecified: Secondary | ICD-10-CM | POA: Diagnosis not present

## 2019-11-22 DIAGNOSIS — Z794 Long term (current) use of insulin: Secondary | ICD-10-CM | POA: Diagnosis not present

## 2019-11-22 DIAGNOSIS — H538 Other visual disturbances: Secondary | ICD-10-CM | POA: Diagnosis present

## 2019-11-22 DIAGNOSIS — Z6841 Body Mass Index (BMI) 40.0 and over, adult: Secondary | ICD-10-CM | POA: Diagnosis not present

## 2019-11-22 DIAGNOSIS — R296 Repeated falls: Secondary | ICD-10-CM | POA: Diagnosis present

## 2019-11-22 DIAGNOSIS — E1065 Type 1 diabetes mellitus with hyperglycemia: Secondary | ICD-10-CM | POA: Diagnosis present

## 2019-11-22 LAB — CBG MONITORING, ED
Glucose-Capillary: 121 mg/dL — ABNORMAL HIGH (ref 70–99)
Glucose-Capillary: 62 mg/dL — ABNORMAL LOW (ref 70–99)
Glucose-Capillary: 365 mg/dL — ABNORMAL HIGH (ref 70–99)
Glucose-Capillary: 391 mg/dL — ABNORMAL HIGH (ref 70–99)
Glucose-Capillary: 99 mg/dL (ref 70–99)
Glucose-Capillary: 105 mg/dL — ABNORMAL HIGH (ref 70–99)
Glucose-Capillary: 382 mg/dL — ABNORMAL HIGH (ref 70–99)

## 2019-11-22 LAB — I-STAT BETA HCG BLOOD, ED (MC, WL, AP ONLY): I-stat hCG, quantitative: <5 m[IU]/mL (ref <5)

## 2019-11-22 LAB — SARS CORONAVIRUS 2 (TAT 6-24 HRS): SARS Coronavirus 2: NEGATIVE

## 2019-11-22 LAB — URINALYSIS, ROUTINE W REFLEX MICROSCOPIC
Bacteria, UA: NONE SEEN
Bilirubin Urine: NEGATIVE
Glucose, UA: >=500 mg/dL — AB
Hgb urine dipstick: NEGATIVE
Ketones, ur: NEGATIVE mg/dL
Nitrite: NEGATIVE
Protein, ur: NEGATIVE mg/dL
Specific Gravity, Urine: 1.001 — ABNORMAL LOW (ref 1.005–1.030)
pH: 6 (ref 5.0–8.0)

## 2019-11-22 MED ORDER — INSULIN ASPART 100 UNIT/ML ~~LOC~~ SOLN
0.0000 [IU] | Freq: Every day | SUBCUTANEOUS | Status: DC
  Administered 2019-11-22: 5 [IU] via SUBCUTANEOUS
  Administered 2019-11-23: 3 [IU] via SUBCUTANEOUS

## 2019-11-22 MED ORDER — POLYETHYLENE GLYCOL 3350 17 G PO PACK: 17.0000 g | PACK | Freq: Every day | ORAL | Status: DC | PRN

## 2019-11-22 MED ORDER — INSULIN ASPART 100 UNIT/ML ~~LOC~~ SOLN
23.0000 [IU] | Freq: Three times a day (TID) | SUBCUTANEOUS | Status: DC
  Administered 2019-11-23 – 2019-11-24 (×4): 23 [IU] via SUBCUTANEOUS

## 2019-11-22 MED ORDER — ENOXAPARIN SODIUM 40 MG/0.4ML ~~LOC~~ SOLN
40.0000 mg | SUBCUTANEOUS | Status: DC
  Administered 2019-11-22 – 2019-11-23 (×2): 40 mg via SUBCUTANEOUS
  Filled 2019-11-22 (×2): qty 0.4

## 2019-11-22 MED ORDER — ESCITALOPRAM OXALATE 10 MG PO TABS
20.0000 mg | ORAL_TABLET | Freq: Every day | ORAL | Status: DC
  Administered 2019-11-23 – 2019-11-24 (×2): 20 mg via ORAL
  Filled 2019-11-22 (×2): qty 2

## 2019-11-22 MED ORDER — INSULIN ASPART 100 UNIT/ML ~~LOC~~ SOLN
0.0000 [IU] | Freq: Three times a day (TID) | SUBCUTANEOUS | Status: DC
  Administered 2019-11-23: 7 [IU] via SUBCUTANEOUS
  Administered 2019-11-23: 11 [IU] via SUBCUTANEOUS
  Administered 2019-11-23 – 2019-11-24 (×2): 15 [IU] via SUBCUTANEOUS

## 2019-11-22 MED ORDER — LORAZEPAM 2 MG/ML IJ SOLN
1.0000 mg | Freq: Once | INTRAMUSCULAR | Status: AC | PRN
  Administered 2019-11-22: 1 mg via INTRAVENOUS
  Filled 2019-11-22: qty 1

## 2019-11-22 MED ORDER — INSULIN GLARGINE 100 UNIT/ML ~~LOC~~ SOLN
90.0000 [IU] | Freq: Every day | SUBCUTANEOUS | Status: DC
  Administered 2019-11-22: 90 [IU] via SUBCUTANEOUS
  Filled 2019-11-22 (×2): qty 0.9

## 2019-11-22 MED ORDER — PANTOPRAZOLE SODIUM 40 MG IV SOLR
40.0000 mg | INTRAVENOUS | Status: DC
  Administered 2019-11-22: 40 mg via INTRAVENOUS
  Filled 2019-11-22: qty 40

## 2019-11-22 MED ORDER — INSULIN ASPART 100 UNIT/ML ~~LOC~~ SOLN
23.0000 [IU] | Freq: Three times a day (TID) | SUBCUTANEOUS | Status: DC
  Administered 2019-11-22 (×2): 23 [IU] via SUBCUTANEOUS

## 2019-11-22 MED ORDER — METHOCARBAMOL 500 MG PO TABS
1000.0000 mg | ORAL_TABLET | Freq: Once | ORAL | Status: AC
  Administered 2019-11-22: 1000 mg via ORAL
  Filled 2019-11-22: qty 2

## 2019-11-22 MED ORDER — GADOBUTROL 1 MMOL/ML IV SOLN
10.0000 mL | Freq: Once | INTRAVENOUS | Status: AC | PRN
  Administered 2019-11-22: 10 mL via INTRAVENOUS

## 2019-11-22 MED ORDER — ACETAMINOPHEN 325 MG PO TABS
650.0000 mg | ORAL_TABLET | Freq: Once | ORAL | Status: AC
  Administered 2019-11-22: 650 mg via ORAL
  Filled 2019-11-22: qty 2

## 2019-11-22 MED ORDER — SODIUM CHLORIDE 0.9 % IV SOLN
1000.0000 mg | Freq: Every day | INTRAVENOUS | Status: AC
  Administered 2019-11-22 – 2019-11-24 (×3): 1000 mg via INTRAVENOUS
  Filled 2019-11-22 (×3): qty 8

## 2019-11-22 MED ORDER — ACETAMINOPHEN 325 MG PO TABS
650.0000 mg | ORAL_TABLET | Freq: Four times a day (QID) | ORAL | Status: DC | PRN
  Administered 2019-11-23 – 2019-11-24 (×2): 650 mg via ORAL
  Filled 2019-11-22 (×2): qty 2

## 2019-11-22 MED ORDER — INSULIN GLARGINE (2 UNIT DIAL) 300 UNIT/ML ~~LOC~~ SOPN: 80.0000 [IU] | PEN_INJECTOR | Freq: Every day | SUBCUTANEOUS | Status: DC

## 2019-11-22 MED ORDER — ACETAMINOPHEN 650 MG RE SUPP: 650.0000 mg | Freq: Four times a day (QID) | RECTAL | Status: DC | PRN

## 2019-11-22 NOTE — Telephone Encounter (Signed)
Called, LVM for pt to call office. Just reaching out to confirm we moved her new patient appt up to see Dr. Epimenio Foot 11/28/19 at 3:30pm. She would need to check in by 3:00pm, bring insurance cards, updated med list with her to appt

## 2019-11-22 NOTE — ED Provider Notes (Addendum)
Danielle Baker is a 24 y.o. female, presenting to the ED primarily complaining of blurry vision in the left eye, some difficulty with coordination, numbness around the left side of the mouth beginning over the last couple days.  She also notes some urgency with urination. She denies difficulty breathing, difficulty swallowing, chest pain, abdominal pain, head injury, recent trauma, dysuria, hematuria, or any other complaints.   HPI from Sharilyn Sites, PA-C: "24 year old female with history of type 1 diabetes, newly diagnosed MS from ER visit on 11/06/2019, presenting to the ED with symptoms concerning for worsening MS.  Patient with initial symptoms were left arm numbness and left truncal numbness along with some issues with dexterity and sensation in her right hand.  She underwent MRI of brain, cervical, and thoracic during last hospitalization with findings concerning for MS. She underwent 5 days of high-dose steroids and symptoms seem to be improving.  States when she left the hospital she felt like symptoms slowly work-up starting to return.  She has called and arranged follow-up with [facility] neurology, however is not due to be seen there until 11/30/2019.  States over the past few days she has now started to experience new symptoms.  She has some pain in her back, worse on her right side.  She does have some increased pain with movement of the right leg but also when she bends her neck and looks down which was not present previously.  She also has some new numbness in her left leg down to the point of about mid shin.  She has not had any numbness in her foot.  She has started to have some difficulty with walking, notably if sitting for a while and trying to stand up.  She has fallen a few times from this.  She also has started to have some difficulty controlling her bladder.  She still feels the urge to urinate, however often times will start dribbling on herself prior to getting to the bathroom but is unable  to control this.  She is not had any bowel incontinence.  She started noticing some blurred vision in her left eye and numbness to left face.  Yesterday it was only numbness to her left lower lip lip, now she feels it inside her mouth and along left nasolabial folds.  She is not drooling but has noticed to be dropping liquids out of the left side of her mouth.  No difficulty swallowing.  She has not had any total loss of vision but has noticed some haziness and some occasional floaters in left eye only.  She is continued wearing her glasses as normal.  She denies any vision change in her right eye.  She has not been driving because of this.  She does notice that her depth perception is off.  Patient has also started dropping things, especially with her right hand.  She is right hand dominant.  She is not had any fever or chills.  No recent infectious symptoms.  She did have some issues controlling her blood sugar immediately after discharge.  Her insulin regimen was adjusted while in the hospital to accommodate for the steroids, she has been able to adjust this again with assistance of her PCP and this seems to be getting better.  Her CBGs the last few mornings have been in the 90s."  Past Medical History:  Diagnosis Date  . Heart murmur    as a child  . Hypoglycemia associated with diabetes (HCC)   . Obesity,  morbid ([place])   . Pancreatitis   . Tachycardia   . Type 1 diabetes mellitus not at goal [facility])    since age of 24 years old   Physical Exam  BP (!) 102/58 (BP Location: Right Arm)   Pulse 82   Temp 98.9 F (37.2 C) (Oral)   Resp 18   SpO2 99%   Physical Exam Vitals and nursing note reviewed.  Constitutional:      General: She is not in acute distress.    Appearance: She is well-developed. She is obese. She is not diaphoretic.  HENT:     Head: Normocephalic and atraumatic.     Mouth/Throat:     Mouth: Mucous membranes are moist.     Pharynx: Oropharynx is clear.  Eyes:      Conjunctiva/sclera: Conjunctivae normal.  Cardiovascular:     Rate and Rhythm: Normal rate and regular rhythm.     Pulses: Normal pulses.          Radial pulses are 2+ on the right side and 2+ on the left side.       Posterior tibial pulses are 2+ on the right side and 2+ on the left side.     Heart sounds: Normal heart sounds.     Comments: Tactile temperature in the extremities appropriate and equal bilaterally. Pulmonary:     Effort: Pulmonary effort is normal. No respiratory distress.     Breath sounds: Normal breath sounds.  Abdominal:     Palpations: Abdomen is soft.     Tenderness: There is no abdominal tenderness. There is no guarding.  Musculoskeletal:     Cervical back: Neck supple.     Right lower leg: No edema.     Left lower leg: No edema.  Lymphadenopathy:     Cervical: No cervical adenopathy.  Skin:    General: Skin is warm and dry.     Capillary Refill: Capillary refill takes less than 2 seconds.  Neurological:     Mental Status: She is alert and oriented to person, place, and time.     Comments: Decreased sensation to light touch in the left perioral region and to the left nasolabial fold. Decreased sensation to light touch to the left upper leg especially the lateral portion. No obvious facial droop, however, there may be less left on the right of the face with smiling, though this was not easily discernible. Left-sided grip strength weakened when compared to the right. No arm drift. No obvious cognitive deficit. Strength 4/5 on the right lower extremity. Strength 3/5 in the left lower extremity.  Psychiatric:        Mood and Affect: Mood and affect normal.        Speech: Speech normal.        Behavior: Behavior normal.     ED Course/Procedures     Procedures   Abnormal Labs Reviewed  CBC WITH DIFFERENTIAL/PLATELET - Abnormal; Notable for the following components:      Result Value   WBC 13.8 (*)    Neutro Abs 8.5 (*)    Lymphs Abs 4.4 (*)    All  other components within normal limits  BASIC METABOLIC PANEL - Abnormal; Notable for the following components:   CO2 20 (*)    Glucose, Bld 305 (*)    Anion gap 16 (*)    All other components within normal limits  URINALYSIS, ROUTINE W REFLEX MICROSCOPIC - Abnormal; Notable for the following components:   Color, Urine COLORLESS (*)  Specific Gravity, Urine 1.001 (*)    Glucose, UA >=500 (*)    Leukocytes,Ua TRACE (*)    All other components within normal limits  CBG MONITORING, ED - Abnormal; Notable for the following components:   Glucose-Capillary 62 (*)    All other components within normal limits  CBG MONITORING, ED - Abnormal; Notable for the following components:   Glucose-Capillary 121 (*)    All other components within normal limits  CBG MONITORING, ED - Abnormal; Notable for the following components:   Glucose-Capillary 365 (*)    All other components within normal limits  CBG MONITORING, ED - Abnormal; Notable for the following components:   Glucose-Capillary 391 (*)    All other components within normal limits    CT HEAD WO CONTRAST  Result Date: 11/07/2019 CLINICAL DATA:  Left arm numbness EXAM: CT HEAD WITHOUT CONTRAST TECHNIQUE: Contiguous axial images were obtained from the base of the skull through the vertex without intravenous contrast. COMPARISON:  09/16/2014 FINDINGS: Brain: No acute intracranial abnormality. Specifically, no hemorrhage, hydrocephalus, mass lesion, acute infarction, or significant intracranial injury. Vascular: No hyperdense vessel or unexpected calcification. Skull: No acute calvarial abnormality. Sinuses/Orbits: Visualized paranasal sinuses and mastoids clear. Orbital soft tissues unremarkable. Other: None IMPRESSION: Normal study. Electronically Signed   By: Charlett Nose M.D.   On: 11/07/2019 01:11   MR Brain W and Wo Contrast  Result Date: 11/22/2019 CLINICAL DATA:  New diagnosis of multiple sclerosis with concerns for worsening. Left arm and  truncal numbness. EXAM: MRI HEAD AND ORBITS WITHOUT AND WITH CONTRAST TECHNIQUE: Multiplanar, multiecho pulse sequences of the brain and surrounding structures were obtained without and with intravenous contrast. Multiplanar, multiecho pulse sequences of the orbits and surrounding structures were obtained including fat saturation techniques, before and after intravenous contrast administration. CONTRAST:  74mL GADAVIST GADOBUTROL 1 MMOL/ML IV SOLN COMPARISON:  11/07/2019 FINDINGS: MRI HEAD FINDINGS Brain: Multiple periventricular and juxtacortical ovoid signal abnormalities with a demyelinating pattern. Bilateral pontine/left brachium pontis and a few juxtacortical lesions are also seen. Nodular and discontinuous ring enhancement has diminished since prior, although subcentimeter lesions in the right centrum semiovale, bilateral periatrial region, and left pons are enhancing today. The left pontine inflammatory focus is new from prior. Comparison of FLAIR imaging with prior is limited given the motion on prior. No infarct, hemorrhage, hydrocephalus, or collection. Vascular: Enhancing major vessels on postcontrast imaging. Developmental venous anomaly in the right parasagittal parietal lobe. Skull and upper cervical spine: Normal marrow signal MRI ORBITS FINDINGS Orbits: No inflammatory finding. Globes, optic nerves, orbital fat, extraocular muscles, vascular structures, and lacrimal glands are normal. Visualized sinuses: Unremarkable Soft tissues: Negative IMPRESSION: 1. Demyelinating/multiple sclerosis pattern with multiple enhancing plaques. There has been an overall decrease in plaque enhancement when compared to 11/07/2019, although there is a new and enhancing plaque seen in the superficial left pons. 2. Negative for optic neuritis. Electronically Signed   By: Marnee Spring M.D.   On: 11/22/2019 09:32   MR BRAIN W WO CONTRAST  Result Date: 11/07/2019 CLINICAL DATA:  Left-sided numbness EXAM: MRI HEAD  WITHOUT AND WITH CONTRAST TECHNIQUE: Multiplanar, multiecho pulse sequences of the brain and surrounding structures were obtained without and with intravenous contrast. CONTRAST:  63mL GADAVIST GADOBUTROL 1 MMOL/ML IV SOLN COMPARISON:  None. FINDINGS: Brain: There are multiple foci of T2 hyperintensity in the supratentorial periventricular greater than subcortical/juxtacortical white matter. Many of the lesions have an orthogonal orientation relative to the lateral ventricles. Most confluent involvement is in the  right temporal lobe. There is also involvement of the bilateral middle cerebellar peduncles and possibly the left cerebellar hemisphere. There is enhancement associated with several lesions. A minority of foci demonstrate marked T1 hypointensity reflecting "black holes." There is greater than expected mild prominence of the lateral ventricles suggesting some volume loss. There is no acute infarction or intracranial hemorrhage. There is no intracranial mass or significant mass effect. Incidental note is made of a right parietal developmental venous anomaly. There is no hydrocephalus. Vascular: Major vessel flow voids at the skull base are preserved. Skull and upper cervical spine: Cervical spine dictated separately. Normal marrow signal. Sinuses/Orbits: Minor mucosal thickening.  Orbits are unremarkable. Other: Mastoid air cells are clear.  Sella is unremarkable. IMPRESSION: Moderate burden of white matter lesions in a distribution suspicious for primary demyelinating disease, particularly multiple sclerosis. Several lesions demonstrate enhancement reflecting acute demyelination/inflammation. Electronically Signed   By: Guadlupe Spanish M.D.   On: 11/07/2019 08:39   MR Cervical Spine W or Wo Contrast  Result Date: 11/07/2019 CLINICAL DATA:  Left sided numbness EXAM: MRI CERVICAL AND THORACIC SPINE WITHOUT AND WITH CONTRAST TECHNIQUE: Multiplanar and multiecho pulse sequences of the cervical spine, to  include the craniocervical junction and cervicothoracic junction, and thoracic spine, were obtained without and with intravenous contrast. CONTRAST:  5mL GADAVIST GADOBUTROL 1 MMOL/ML IV SOLN COMPARISON:  None. FINDINGS: MRI CERVICAL SPINE Alignment: Anteroposterior alignment is maintained. Vertebrae: Vertebral body heights are preserved. Normal marrow signal. No suspicious osseous lesion. Cord: There is abnormal T2 hyperintensity within the central cord at the C3-C4 level with associated enhancement. Additional abnormal signal dorsally at the cervicomedullary junction. Posterior Fossa, vertebral arteries, paraspinal tissues: Intracranial findings are dictated separately. Otherwise unremarkable. Disc levels: Small central disc protrusion at C3-C4. Minimal disc bulge at C5-C6. No significant stenosis. MRI THORACIC SPINE Alignment:  Physiologic. Vertebrae: Vertebral body heights are preserved apart from minimal loss of height at the superior endplate of T11. There is no marrow edema or suspicious osseous lesion. Cord: There is abnormal T2 hyperintensity at the left dorsal aspect of the cord at T1 with corresponding enhancement. Additional abnormal signal without enhancement at T2 and T3-T4. There may be other more patchy involvement. Paraspinal and other soft tissues: Unremarkable. Disc levels: Intervertebral disc heights and signal are preserved. IMPRESSION: Foci of abnormal cervical and thoracic cord signal suspicious for demyelinating disease. There is associated enhancement at C3-C4 and T1 levels reflecting acute inflammation/demyelination. Electronically Signed   By: Guadlupe Spanish M.D.   On: 11/07/2019 08:30   MR THORACIC SPINE W WO CONTRAST  Result Date: 11/07/2019 CLINICAL DATA:  Left sided numbness EXAM: MRI CERVICAL AND THORACIC SPINE WITHOUT AND WITH CONTRAST TECHNIQUE: Multiplanar and multiecho pulse sequences of the cervical spine, to include the craniocervical junction and cervicothoracic junction,  and thoracic spine, were obtained without and with intravenous contrast. CONTRAST:  23mL GADAVIST GADOBUTROL 1 MMOL/ML IV SOLN COMPARISON:  None. FINDINGS: MRI CERVICAL SPINE Alignment: Anteroposterior alignment is maintained. Vertebrae: Vertebral body heights are preserved. Normal marrow signal. No suspicious osseous lesion. Cord: There is abnormal T2 hyperintensity within the central cord at the C3-C4 level with associated enhancement. Additional abnormal signal dorsally at the cervicomedullary junction. Posterior Fossa, vertebral arteries, paraspinal tissues: Intracranial findings are dictated separately. Otherwise unremarkable. Disc levels: Small central disc protrusion at C3-C4. Minimal disc bulge at C5-C6. No significant stenosis. MRI THORACIC SPINE Alignment:  Physiologic. Vertebrae: Vertebral body heights are preserved apart from minimal loss of height at the superior endplate of  T11. There is no marrow edema or suspicious osseous lesion. Cord: There is abnormal T2 hyperintensity at the left dorsal aspect of the cord at T1 with corresponding enhancement. Additional abnormal signal without enhancement at T2 and T3-T4. There may be other more patchy involvement. Paraspinal and other soft tissues: Unremarkable. Disc levels: Intervertebral disc heights and signal are preserved. IMPRESSION: Foci of abnormal cervical and thoracic cord signal suspicious for demyelinating disease. There is associated enhancement at C3-C4 and T1 levels reflecting acute inflammation/demyelination. Electronically Signed   By: Guadlupe Spanish M.D.   On: 11/07/2019 08:30   US BREAST LTD UNI LEFT INC AXILLA  Result Date: 11/03/2019 CLINICAL DATA:  24 year old female presenting for evaluation of focal pain in the upper inner quadrant of the left breast for a few weeks. EXAM: ULTRASOUND OF THE LEFT BREAST COMPARISON:  None. FINDINGS: On physical exam, no suspicious palpable masses are identified in the superior to upper inner upper inner  left breast. Targeted ultrasound is performed, showing normal fibroglandular tissue in the superior to upper inner left breast. No suspicious masses or areas of shadowing are identified. IMPRESSION: Normal targeted ultrasound of the region of pain in the upper inner left breast. RECOMMENDATION: 1. Clinical follow-up recommended for the tender area of concern in the left breast. Any further workup should be based on clinical grounds. 2. Screening mammogram at age 37 unless there are persistent or intervening clinical concerns. (Code:SM-B-40A) I have discussed the findings and recommendations with the patient. If applicable, a reminder letter will be sent to the patient regarding the next appointment. BI-RADS CATEGORY  1: Negative. Electronically Signed   By: Frederico Hamman M.D.   On: 11/03/2019 10:55   MR ORBITS W WO CONTRAST  Result Date: 11/22/2019 CLINICAL DATA:  New diagnosis of multiple sclerosis with concerns for worsening. Left arm and truncal numbness. EXAM: MRI HEAD AND ORBITS WITHOUT AND WITH CONTRAST TECHNIQUE: Multiplanar, multiecho pulse sequences of the brain and surrounding structures were obtained without and with intravenous contrast. Multiplanar, multiecho pulse sequences of the orbits and surrounding structures were obtained including fat saturation techniques, before and after intravenous contrast administration. CONTRAST:  58mL GADAVIST GADOBUTROL 1 MMOL/ML IV SOLN COMPARISON:  11/07/2019 FINDINGS: MRI HEAD FINDINGS Brain: Multiple periventricular and juxtacortical ovoid signal abnormalities with a demyelinating pattern. Bilateral pontine/left brachium pontis and a few juxtacortical lesions are also seen. Nodular and discontinuous ring enhancement has diminished since prior, although subcentimeter lesions in the right centrum semiovale, bilateral periatrial region, and left pons are enhancing today. The left pontine inflammatory focus is new from prior. Comparison of FLAIR imaging with  prior is limited given the motion on prior. No infarct, hemorrhage, hydrocephalus, or collection. Vascular: Enhancing major vessels on postcontrast imaging. Developmental venous anomaly in the right parasagittal parietal lobe. Skull and upper cervical spine: Normal marrow signal MRI ORBITS FINDINGS Orbits: No inflammatory finding. Globes, optic nerves, orbital fat, extraocular muscles, vascular structures, and lacrimal glands are normal. Visualized sinuses: Unremarkable Soft tissues: Negative IMPRESSION: 1. Demyelinating/multiple sclerosis pattern with multiple enhancing plaques. There has been an overall decrease in plaque enhancement when compared to 11/07/2019, although there is a new and enhancing plaque seen in the superficial left pons. 2. Negative for optic neuritis. Electronically Signed   By: Marnee Spring M.D.   On: 11/22/2019 09:32      MDM   Clinical Course as of Nov 21 1337  Tue Nov 22, 2019  0715 Last food was yesterday around 4 PM, which was also the  time of her last short acting insulin.  Glucose-Capillary(!): 62 [SJ]  [id number] Spoke with Dr. Laurence Slate, neurologist.  States he will come see the patient and do an official consult and evaluation.   [SJ]  1250 Patient having lower back pain that she thinks feel like spasms.  They are intermittent.   [SJ]  1315 Spoke with Josh, IM resident. Agrees to come see and admit patient.   [SJ]  1337 Zerita Boers, RN, DM coordinator recommending 90 Lantus for patient's basal insulin since patient's Toujeo is not on formulary here.   [SJ]    Clinical Course User Index [SJ] Anselm Pancoast, PA-C   Patient care handoff report received from Sharilyn Sites, PA-C. Plan: Review MRI results and contact neurology for advice on next steps.  Patient presents with new onset neurologic deficits.  Some of these deficits were able to be confirmed on exam.  Suspect progression and changing of her MS. MRI shows increased areas of plaques, especially around the  pons. Patient able to urinate.  Bladder scan with minimal urine in the bladder.  Her blood sugar varied during her time in the ED.  Low suspicion for DKA. It was recommended by neurology that the patient receive high-dose Solu-Medrol each day.  I have concerns that the patient may not be able to adequately control her blood sugar at home if she were to simply be brought back each day for infusions.  This is increases her complexity and I think she would be better served admitted to the hospital.    Vitals:   11/21/19 1946 11/22/19 0007 11/22/19 0250 11/22/19 0556  BP: (!) 147/100 120/73 127/80 (!) 102/58  Pulse: (!) 124 100 (!) 103 82  Resp: Temp: 98.9 F (37.2 C)     TempSrc: Oral     SpO2: 98% 96% 100% 99%   Vitals:   11/22/19 0250 11/22/19 0556 11/22/19 0721 11/22/19 1024  BP: 127/80 (!) 102/58 111/65 115/68  Pulse: (!) 103 82 84 95  Resp: Temp:      TempSrc:      SpO2: 100% 99% 98% 93%      Anselm Pancoast, PA-C 11/22/19 1332    Anselm Pancoast, PA-C 11/22/19 1339    Tegeler, Canary Brim, MD 11/22/19 [phone]

## 2019-11-22 NOTE — ED Provider Notes (Signed)
[facility] EMERGENCY DEPARTMENT Provider Note   CSN: [id number] Arrival date & time: 11/21/19  1916     History Chief Complaint  Patient presents with  . MS symptoms    Danielle Baker is a 24 y.o. female.  The history is provided by the patient and medical records.    24 year old female with history of type 1 diabetes, newly diagnosed MS from ER visit on 11/06/2019, presenting to the ED with symptoms concerning for worsening MS.  Patient with initial symptoms were left arm numbness and left truncal numbness along with some issues with dexterity and sensation in her right hand.  She underwent MRI of brain, cervical, and thoracic during last hospitalization with findings concerning for MS. She underwent 5 days of high-dose steroids and symptoms seem to be improving.  States when she left the hospital she felt like symptoms slowly work-up starting to return.  She has called and arranged follow-up with [facility] neurology, however is not due to be seen there until 11/30/2019.  States over the past few days she has now started to experience new symptoms.  She has some pain in her back, worse on her right side.  She does have some increased pain with movement of the right leg but also when she bends her neck and looks down which was not present previously.  She also has some new numbness in her left leg down to the point of about mid shin.  She has not had any numbness in her foot.  She has started to have some difficulty with walking, notably if sitting for a while and trying to stand up.  She has fallen a few times from this.  She also has started to have some difficulty controlling her bladder.  She still feels the urge to urinate, however often times will start dribbling on herself prior to getting to the bathroom but is unable to control this.  She is not had any bowel incontinence.  She started noticing some blurred vision in her left eye and numbness to left face.  Yesterday it was  only numbness to her left lower lip lip, now she feels it inside her mouth and along left nasolabial folds.  She is not drooling but has noticed to be dropping liquids out of the left side of her mouth.  No difficulty swallowing.  She has not had any total loss of vision but has noticed some haziness and some occasional floaters in left eye only.  She is continued wearing her glasses as normal.  She denies any vision change in her right eye.  She has not been driving because of this.  She does notice that her depth perception is off.  Patient has also started dropping things, especially with her right hand.  She is right hand dominant.  She is not had any fever or chills.  No recent infectious symptoms.  She did have some issues controlling her blood sugar immediately after discharge.  Her insulin regimen was adjusted while in the hospital to accommodate for the steroids, she has been able to adjust this again with assistance of her PCP and this seems to be getting better.  Her CBGs the last few mornings have been in the 90s.  Past Medical History:  Diagnosis Date  . Heart murmur    as a child  . Hypoglycemia associated with diabetes (HCC)   . Obesity, morbid (HCC)   . Pancreatitis   . Tachycardia   . Type 1  diabetes mellitus not at goal  [facility])    since age of 24 years old    Patient Active Problem List   Diagnosis Date Noted  . Multiple sclerosis exacerbation ([place]) 11/07/2019  . History of cesarean delivery 08/06/2018  . Diabetes mellitus complicating pregnancy in third trimester, antepartum 03/18/2016  . DKA (diabetic ketoacidoses) ([place]) 08/31/2013  . Hypoglycemia associated with diabetes ([place])   . DM autonomic neuropathy ([place])   . Peripheral autonomic neuropathy due to diabetes mellitus ([place])   . Goiter   . Obesity, morbid ([place])   . Dyspepsia   . Acanthosis nigricans, acquired   . Tachycardia   . Hypertension associated with diabetes ([place])   . Uncontrolled type 1 diabetes mellitus ([place])  12/16/2010  . Goiter, unspecified 12/16/2010  . Obesity 12/16/2010    Past Surgical History:  Procedure Laterality Date  . CESAREAN SECTION N/A 03/17/2016   Procedure: CESAREAN SECTION;  Surgeon: Jerelyn Charles, MD;  Location: [place];  Service: Obstetrics;  Laterality: N/A;  REQUEST RNFA  . CESAREAN SECTION N/A 08/06/2018   Procedure: CESAREAN SECTION;  Surgeon: Jerelyn Charles, MD;  Location: [place];  Service: Obstetrics;  Laterality: N/A;  RNFA AVAILABLE  . CHOLECYSTECTOMY       OB History    Gravida  2   Para  2   Term  2   Preterm      AB      Living  2     SAB      TAB      Ectopic      Multiple  0   Live Births  2           Family History  Problem Relation Age of Onset  . Thyroid disease Mother   . Obesity Mother   . Diabetes Father   . Obesity Father   . Obesity Sister   . Cancer Maternal Grandmother   . Obesity Paternal Grandfather     Social History   Tobacco Use  . Smoking status: Never Smoker  . Smokeless tobacco: Never Used  Substance Use Topics  . Alcohol use: No    Alcohol/week: 0.0 standard drinks  . Drug use: No    Home Medications Prior to Admission medications   Medication Sig Start Date End Date Taking? Authorizing Provider  escitalopram (LEXAPRO) 20 MG tablet Take 20 mg by mouth daily. 10/11/19   [provider]  HUMALOG KWIKPEN 100 UNIT/ML KwikPen Inject 23 Units into the skin in the morning, at noon, and at bedtime. 10/06/19   [provider]  TOUJEO MAX SOLOSTAR 300 UNIT/ML Solostar Pen Inject 80 Units into the skin at bedtime. 09/23/19   [provider]  Vitamin D, Ergocalciferol, (DRISDOL) 1.25 MG (50000 UNIT) CAPS capsule Take 1 capsule (50,000 Units total) by mouth every 7 (seven) days. 11/15/19 12/15/19  Mitzi Hansen, MD    Allergies    Enalapril and Morphine and related  Review of Systems   Review of Systems  Musculoskeletal: Positive for gait problem.   Neurological: Positive for numbness.  All other systems reviewed and are negative.   Physical Exam Updated Vital Signs BP 127/80 (BP Location: Left Arm)   Pulse (!) 103   Temp 98.9 F (37.2 C) (Oral)   Resp 16   SpO2 100%   Physical Exam Vitals and nursing note reviewed.  Constitutional:      Appearance: She is well-developed.  HENT:     Head: Normocephalic and atraumatic.  Eyes:     Conjunctiva/sclera: Conjunctivae normal.     Pupils: Pupils are equal, round, and reactive to light.     Comments: EOMs are intact, no observed nystagmus, no apparent field cuts but does endorse blurred/double vision in left eye only, pupils appear symmetric and reactive  Cardiovascular:     Rate and Rhythm: Normal rate and regular rhythm.     Heart sounds: Normal heart sounds.  Pulmonary:     Effort: Pulmonary effort is normal. No respiratory distress.     Breath sounds: Normal breath sounds. No rhonchi.  Abdominal:     General: Bowel sounds are normal.     Palpations: Abdomen is soft.     Tenderness: There is no abdominal tenderness. There is no rebound.  Musculoskeletal:        General: Normal range of motion.     Cervical back: Normal range of motion.  Skin:    General: Skin is warm and dry.  Neurological:     Mental Status: She is alert and oriented to person, place, and time.     Comments: AAOx3, speech is clear and goal oriented; she is able to raise each of her extremities without assistance, able to plantar and dorsiflex both feet against resistance, there is some noted pain with SLR bilaterally (R>L) Sensory deficits as follows: - right arm with essentially normal sensation down to the level of the wrist; decreased sensation throughout entire right hand - left arm with diffuse sensory changes from shoulder down to hand - right leg maintains normal sensation in its entirety - left leg with decreased sensation from the hip down to level of mid shin, distal to this with normal  sensation - left truncal decreased sensation but normal tone, able to sit up and lay back independently She does appear to have a lot of difficulty with fine motor tasks in both of her hands, ultimately able to complete finger opposition with each finger, however this does seem delayed from baseline, left hand does seem worse than right Able to complete heel to shin bilaterally     ED Results / Procedures / Treatments   Labs (all labs ordered are listed, but only abnormal results are displayed) Labs Reviewed  CBC WITH DIFFERENTIAL/PLATELET - Abnormal; Notable for the following components:      Result Value   WBC 13.8 (*)    Neutro Abs 8.5 (*)    Lymphs Abs 4.4 (*)    All other components within normal limits  BASIC METABOLIC PANEL - Abnormal; Notable for the following components:   CO2 20 (*)    Glucose, Bld 305 (*)    Anion gap 16 (*)    All other components within normal limits  URINALYSIS, ROUTINE W REFLEX MICROSCOPIC - Abnormal; Notable for the following components:   Color, Urine COLORLESS (*)    Specific Gravity, Urine 1.001 (*)    Glucose, UA >=500 (*)    Leukocytes,Ua TRACE (*)    All other components within normal limits  I-STAT BETA HCG BLOOD, ED ([facility], [facility], [facility] ONLY)  CBG MONITORING, ED    EKG None  Radiology No results found.  Procedures Procedures (including critical care time)  Medications Ordered in ED Medications - No data to display  ED Course  I have reviewed the triage vital signs and the nursing notes.  Pertinent labs & imaging results that were available during my care of the patient were reviewed by me and considered in my medical decision making (see  chart for details).    MDM Rules/Calculators/A&P  24 year old female with recent diagnosis of MS, presenting to the ED with worsening symptoms.  Initial diagnosis on 11/06/2019 with symptoms of left arm and truncal numbness.  Found to have rather significant disease on MRI's and completed 5 days of  high-dose steroids.  Since returning home some of her symptoms have worsened and she is also developed new symptoms over the past few days including left leg numbness, blurred vision in left eye, decreased sensation of right hand, and some intermittent bladder control issues.  On exam she is awake, alert, appropriately oriented.  She is afebrile and nontoxic.  Exam findings as above, majority of her deficits are sensory related.  She does not have any focal weakness, remains ambulatory.  She has not been incontinent of urine since arrival in ED.  No bowel incontinence.  Will discuss with neurology for recommendations.  3:41 AM Discussed with Dr. Wilford Corner-- he has reviewed prior scans and we have reviewed her initial symptoms and current/new symptoms today.  Given new vision symptoms today, will obtain MRI brain w/ and w/out contrast along with orbits.  Can re-discuss after imaging has been done. If new/worsening findings, may need additional high dose steroids.  6:40 AM MRI's still pending.  Care will be signed out to oncoming provider.  Will follow-up on results and discuss with neurology again for recommendations based on findings.  Final Clinical Impression(s) / ED Diagnoses Final diagnoses:  Multiple sclerosis [facility])    Rx / DC Orders ED Discharge Orders    None       Garlon Hatchet, PA-C 11/22/19 1607    Shon Baton, MD 11/28/19 [phone]

## 2019-11-22 NOTE — Consult Note (Signed)
Neurology Consultation  Reason for Consult: New MS lesions found on MRI Referring Physician: Shon Baton, MD  CC: Increased numbness and weakness in the left arm, new symptoms of numbness and tingling in her left leg and left face  History is obtained from: Patient  HPI: Danielle Baker is a 24 y.o. female with past medical history of type 1 diabetes, obesity and new diagnosis of MS.  Patient was recently seen and released from the hospital after receiving 5 days of Solu-Medrol for new diagnosis of multiple sclerosis.  Patient does have an appointment to see Dr. Epimenio Foot on 14 April.  Patient returns the ER secondary to worsening of her symptoms on the left side and now stating she has facial numbness and blurred vision in her left eye.  She is also describing a shocklike symptom and numb sensation in both legs when she looks down to her chin.  She had called her PCP who recommended that she come to the ED.  ED course  MRI of brain and orbits to evaluate for active demyelinating lesions  Chart review-patient was seen on 11/09/2019 for decreased sensation in left arm, weakness left arm and decreased sensation in the thoracic region.  At that time patients MRI moderate burden of white matter lesions in the distribution suspicious for primary demyelinating disease.  Were also foci of abnormal cervical and thoracic cord signals.  It also showed associated enhancement at the C3-4 and T1 level reflecting acute inflammation/demyelination.  Patient received 5 days of IV Solu-Medrol and was made an appointment to see Dr. Sherol Dade on 14 April.  Upon discharge she had some improvement of her numbness on the left side.  Work up that has been done: As above in chart review  Past Medical History:  Diagnosis Date  . Heart murmur    as a child  . Hypoglycemia associated with diabetes (HCC)   . Obesity, morbid (HCC)   . Pancreatitis   . Tachycardia   . Type 1 diabetes mellitus not at goal [facility])    since  age of 24 years old    Family History  Problem Relation Age of Onset  . Thyroid disease Mother   . Obesity Mother   . Diabetes Father   . Obesity Father   . Obesity Sister   . Cancer Maternal Grandmother   . Obesity Paternal Grandfather    Social History:   reports that she has never smoked. She has never used smokeless tobacco. She reports that she does not drink alcohol or use drugs.  Medications  Current Facility-Administered Medications:  .  insulin aspart (novoLOG) injection 23 Units, 23 Units, Subcutaneous, TID WC, Joy, Shawn C, PA-C, 23 Units at 11/22/19 1038  Current Outpatient Medications:  .  escitalopram (LEXAPRO) 20 MG tablet, Take 20 mg by mouth daily., Disp: , Rfl:  .  HUMALOG KWIKPEN 100 UNIT/ML KwikPen, Inject 23 Units into the skin in the morning, at noon, and at bedtime., Disp: , Rfl:  .  TOUJEO MAX SOLOSTAR 300 UNIT/ML Solostar Pen, Inject 80 Units into the skin at bedtime., Disp: , Rfl:  .  Vitamin D, Ergocalciferol, (DRISDOL) 1.25 MG (50000 UNIT) CAPS capsule, Take 1 capsule (50,000 Units total) by mouth every 7 (seven) days., Disp: 4 capsule, Rfl: 0  ROS:     General ROS: negative for - chills, fatigue, fever, night sweats, weight gain or weight loss Psychological ROS: negative for - behavioral disorder, hallucinations, memory difficulties, mood swings or suicidal ideation Ophthalmic  ROS: Positive for - blurry vision in left eye ENT ROS: negative for - epistaxis, nasal discharge, oral lesions, sore throat, tinnitus or vertigo Allergy and Immunology ROS: negative for - hives or itchy/watery eyes Hematological and Lymphatic ROS: negative for - bleeding problems, bruising or swollen lymph nodes Endocrine ROS: negative for - galactorrhea, hair pattern changes, polydipsia/polyuria or temperature intolerance Respiratory ROS: negative for - cough, hemoptysis, shortness of breath or wheezing Cardiovascular ROS: negative for - chest pain, dyspnea on exertion, edema or  irregular heartbeat Gastrointestinal ROS: negative for - abdominal pain, diarrhea, hematemesis, nausea/vomiting or stool incontinence Genito-Urinary ROS: negative for - dysuria, hematuria, incontinence or urinary frequency/urgency Musculoskeletal ROS: Positive for -  muscular weakness Neurological ROS: as noted in HPI Dermatological ROS: negative for rash and skin lesion changes  Exam: Current vital signs: BP 115/68 (BP Location: Left Arm)   Pulse 95   Temp 98.9 F (37.2 C) (Oral)   Resp 18   SpO2 93%  Vital signs in last 24 hours: Temp:  [98.9 F (37.2 C)] 98.9 F (37.2 C) (04/05 1946) Pulse Rate:  [82-124] 95 (04/06 1024) Resp:  [16-18] 18 (04/06 1024) BP: (102-147)/(58-100) 115/68 (04/06 1024) SpO2:  [93 %-100 %] 93 % (04/06 1024)   Constitutional: Appears well-developed and well-nourished.  Eyes: No scleral injection HENT: No OP obstrucion Head: Normocephalic.  Cardiovascular: Normal rate and regular rhythm.  Respiratory: Effort normal, non-labored breathing GI: Soft.  No distension. There is no tenderness.  Skin: WDI  Neuro: Mental Status: Patient is awake, alert, oriented to person, place, month, year, and situation. Speech-intact for naming, repeating, comprehension Patient is able to give a clear and coherent history. Cranial Nerves: II: Visual Fields are full.  However she does have difficulty with her left eye stating there is some blurry aspect of her vision III,IV, VI: EOMI without ptosis or diploplia. Pupils equal, round and reactive to light V: Facial sensation is symmetric to temperature VII: Facial movement is symmetric.  VIII: hearing is intact to voice X: Palat elevates symmetrically XI: Shoulder shrug is symmetric. XII: tongue is midline without atrophy or fasciculations.  Motor: Tone is normal. Bulk is normal. 5/5 strength was present in left leg, and right arm and leg.  Left arm appears to have 4/5 strength with bicep flexion, tricep extension,  and significant weakness in her left grip Sensory: Sensation is diminished on her left arm and left leg. Deep Tendon Reflexes: 2+ and symmetric in the biceps and patellae.  Plantars: Toes are downgoing bilaterally.  Cerebellar: FNF and HKS are intact bilaterally  Labs I have reviewed labs in epic and the results pertinent to this consultation are:  CBC    Component Value Date/Time   WBC 13.8 (H) 11/21/2019 1952   RBC 4.97 11/21/2019 1952   HGB 13.3 11/21/2019 1952   HCT 43.3 11/21/2019 1952   PLT 348 11/21/2019 1952   MCV 87.1 11/21/2019 1952   MCH 26.8 11/21/2019 1952   MCHC 30.7 11/21/2019 1952   RDW 13.6 11/21/2019 1952   LYMPHSABS 4.4 (H) 11/21/2019 1952   MONOABS 0.6 11/21/2019 1952   EOSABS 0.1 11/21/2019 1952   BASOSABS 0.0 11/21/2019 1952    CMP     Component Value Date/Time   NA 137 11/21/2019 1952   K 4.2 11/21/2019 1952   CL 101 11/21/2019 1952   CO2 20 (L) 11/21/2019 1952   GLUCOSE 305 (H) 11/21/2019 1952   BUN 13 11/21/2019 1952   CREATININE 0.86 11/21/2019 1952  CREATININE 0.62 03/22/2015 1210   CALCIUM 9.0 11/21/2019 1952   PROT 6.3 (L) 11/08/2019 0402   ALBUMIN 3.0 (L) 11/08/2019 0402   AST 38 11/08/2019 0402   ALT 34 11/08/2019 0402   ALKPHOS 97 11/08/2019 0402   BILITOT 0.7 11/08/2019 0402   GFRNONAA >60 11/21/2019 1952   GFRNONAA >89 03/22/2015 1210   GFRAA >60 11/21/2019 1952   GFRAA >89 03/22/2015 1210    Lipid Panel     Component Value Date/Time   CHOL 160 03/22/2015 1210   TRIG 151.0 (H) 03/22/2015 1210   HDL 35.30 (L) 03/22/2015 1210   CHOLHDL 5 03/22/2015 1210   VLDL 30.2 03/22/2015 1210   [place] 95 03/22/2015 1210     Imaging I have reviewed the images obtained:  MRI examination of the brain-Demyelinating/multiple sclerosis pattern with multiple enhancing plaques. There has been an overall decrease in plaque enhancement when compared to 11/07/2019, although there is a new and enhancing plaque seen in the superficial  left pons.  Etta Quill PA-C [facility] [phone]  M-F  (9:00 am- 5:00 PM)  11/22/2019, 10:54 AM     Assessment:  This is a 24 year old female who was recently diagnosed with multiple sclerosis.  On 22 March she did receive 5 days of Solu-Medrol with some resolution of her left arm and leg numbness and weakness.  Patient returned to the ED secondary to worsening symptoms.  Patient comes to the ED secondary to having worsening symptoms in the left arm and leg along with new symptoms of left facial numbness and some decrease sensation in the right leg.  MRI today shows demyelinating/multiple sclerosis pattern with multiple enhancing plaques.  NMO ab negative.  As stated above exam shows increased weakness in her left hand and wrist, along with left arm.    Impression: -MS exacerbation -Left-sided decreased sensation and weakness   Recommendations: -3 days of Solu-Medrol 1 g daily -40 mg Protonix IV daily -PT OT -I have discussed with Dr. Felecia Shelling and he has moved her appointment up to April 12 at 33 p.m.     NEUROHOSPITALIST ADDENDUM Performed a face to face diagnostic evaluation.   I have reviewed the contents of history and physical exam as documented by PA/ARNP/Resident and agree with above documentation.  I have discussed and formulated the above plan as documented. Edits to the note have been made as needed.  24 year old female with type 1 diabetes mellitus and new diagnosis of MS in March presents to the emergency department with blurry vision, left-sided numbness. Lhermitte's Sign when she looks down towards her chin.  MRI brain now shows new enhancing lesions, previous lesions less active. We will treat her with Solu-Medrol for 3 more days, expedited appointment Dr. Felecia Shelling, neuro immunologist to start disease modifying therapy as soon as possible.   Karena Addison Dymond Spreen MD [facility] [phone]   If 7pm to 7am, please call on call as listed on  AMION.

## 2019-11-22 NOTE — ED Notes (Signed)
ED Provider at bedside. 

## 2019-11-22 NOTE — Telephone Encounter (Signed)
Pt returned call and was informed. Pt verbalized understanding.

## 2019-11-22 NOTE — Progress Notes (Signed)
Inpatient Diabetes Program Recommendations  AACE/ADA: New Consensus Statement on Inpatient Glycemic Control (2015)  Target Ranges:  Prepandial:   less than 140 mg/dL      Peak postprandial:   less than 180 mg/dL (1-2 hours)      Critically ill patients:  140 - 180 mg/dL   Lab Results  Component Value Date   GLUCAP 391 (H) 11/22/2019   HGBA1C 9.4 (H) 11/07/2019    Review of Glycemic Control Results for Danielle, Baker (MRN [id number]) as of 11/22/2019 13:23  Ref. Range 11/22/2019 07:26 11/22/2019 10:37 11/22/2019 11:21  Glucose-Capillary Latest Ref Range: 70 - 99 mg/dL 174 (H) 081 (H) 448 (H)    Diabetes history: DM1-Does not make insulin-requires basal insulin, correction and meal coverage  Outpatient Diabetes medications: Toujeo U-300 80 units daily + Humalog 23 units TID with meals Current orders for Inpatient glycemic control: Novolog 23 units TID with meals + Solumedrol 1000 mg daily  Inpatient Diabetes Program Recommendations:    -Lantus 90 units daily ASAP as patient has type 1 diabetes and does not make insulin.   -Novolog 0-9 TID with meals and 0-5 QHS    Note: Spoke with patient on the phone.  She was recently diagnosed with MS and presents with worsening symptoms.  Her endocrinologist is DR. Rhett Bannister with [facility].  Patient normally takes Basal insulin at 10pm.  Missed her dose last night because she was in the ED.  Sent secure chat to Dr. Laney Potash and Harolyn Rutherford, [place] recommending 90 units of Lantus.  Blood sugar is greater than 300 mg/dl.  Current A1C is 9.4% (average blood sugar 223 mg/dl).  Discussed importance of achieving goal A1C range.  She checks her blood sugar at least 4 times daily.  Does not normally experience low blood sugars.  She does feel when she is low and treats with juice.  She rotates her sites.  Watches her CHO intake and does not drink anything with sugar.  She did have a low blood sugar this morning of 62 mg/dl.  She thinks it was because  she hadn't eaten anything since 2 pm yesterday.  Will continue to follow while inpatient.  Thank you, [name], RN, BSN Diabetes Coordinator Inpatient Diabetes Program [phone] (team pager from 8a-5p)

## 2019-11-22 NOTE — ED Notes (Addendum)
Bed placement called regarding 3W pre-assigned bed, staff advised that room was dirty; bed placement to contact EVS supervisor

## 2019-11-22 NOTE — ED Notes (Signed)
Taken to MRI 

## 2019-11-22 NOTE — H&P (Signed)
Date: 11/22/2019               Patient Name:  Danielle Baker MRN: [id number]  DOB: 11-08-1995 Age / Sex: 24 y.o., female   PCP: Danielle Collie, MD         Medical Service: Internal Medicine Teaching Service         Attending Physician: Dr. Aldine Contes, MD    First Contact: Dr. Jeanmarie Baker Pager: [phone]  Second Contact: Dr. Kristie Baker Pager: [phone]       After Hours (After 5p/  First Contact Pager: [phone]  weekends / holidays): Second Contact Pager: [phone]   Chief Complaint: Weakness and numbness  History of Present Illness: Patient is a 24 year old female with past medical history significant for multiple sclerosis, type 1 diabetes mellitus, pancreatitis who presents for evaluation of numbness, weakness, vision changes.  Patient reports that since past Friday, 11/18/2019 she has had worsening symptoms, including left arm/hand numbness and weakness, right hand numbness, numbness on the left side of her face, muscle spasms, electric jolt sensation when bending head forward, and cloudiness of left-eye vision. Patient denies chest pain, shortness of breath, vomiting, urinary incontinence, dysuria.   Patient reports that she was diagnosed with type 1 diabetes mellitus at the age of 69, has been hospitalized about 4 times for diabetic ketoacidosis secondary to pancreatitis, last episode about 2 years ago.  Meds:  *Tylenol PRN *Dimenhydrinate 50 mg Q8HR PRN for nausea/dizziness *Escitalopram 20 mg daily *Toujeo 80 units at bedtime + humalog 23 units three times daily with meals *Occasional sliding scale regimen with 1 unit for every 5 in glucose above goal of 100  Allergies: Allergies as of 11/21/2019 - Review Complete 11/21/2019  Allergen Reaction Noted  . Enalapril Other (See Comments) 08/30/2013  . Morphine and related Rash 12/16/2010   Past Medical History:  Diagnosis Date  . Heart murmur    as a child  . Hypoglycemia associated with diabetes ([place])   . Obesity,  morbid ([place])   . Pancreatitis   . Tachycardia   . Type 1 diabetes mellitus not at goal [facility])    since age of 24 years old    Family History:  Family History  Problem Relation Age of Onset  . Thyroid disease Mother   . Obesity Mother   . Diabetes Father   . Obesity Father   . Obesity Sister   . Cancer Maternal Grandmother   . Obesity Paternal Grandfather   *Possible MS in paternal aunt   Social History:  *Denies alcohol, tobacco, or recreational drug usage *Lives in [place], PCP is Danielle Baker, sees PA Danielle Baker  Review of Systems: A complete ROS was negative except as per HPI.   Physical Exam: Blood pressure 115/68, pulse 95, temperature 98.9 F (37.2 C), temperature source Oral, resp. rate 18, SpO2 93 %, unknown if currently breastfeeding. Physical Exam  Constitutional: She is well-developed, well-nourished, and in no distress.  HENT:  Head: Normocephalic and atraumatic.  Eyes: EOM are normal. Right eye exhibits no discharge. Left eye exhibits no discharge.  Neck: No tracheal deviation present.  Cardiovascular: Normal rate and regular rhythm. Exam reveals no gallop and no friction rub.  No murmur heard. Pulmonary/Chest: Effort normal and breath sounds normal. No respiratory distress. She has no wheezes. She has no rales.  Abdominal: Soft. She exhibits no distension. There is no abdominal tenderness. There is no rebound and no guarding.  Musculoskeletal:  General: No tenderness, deformity or edema. Normal range of motion.     Cervical back: Normal range of motion.  Neurological: She is alert. Coordination normal.  *4+/5 strength in right upper, 4-/5 in left upper extremities *Decreased sensation on left arm, left side of face *4+/5 strength in right lower, 4-/5 in left lower extremities (limited by back pain) *Cranial nerve exam without deficit  Skin: Skin is warm and dry. No rash noted. She is not diaphoretic. No erythema.  Psychiatric:  Memory and judgment normal.    MR Orbits W WO Contrast (11/22/19): MR Brain W WO Contrast (11/22/19): IMPRESSION: 1. Demyelinating/multiple sclerosis pattern with multiple enhancing plaques. There has been an overall decrease in plaque enhancement when compared to 11/07/2019, although there is a new and enhancing plaque seen in the superficial left pons. 2. Negative for optic neuritis.   Assessment & Plan by Problem: Active Problems:   Multiple sclerosis exacerbation (HCC)  Patient is a 24 year old female with past medical history of multiple sclerosis, type 1 diabetes mellitus, pancreatitis who presents for 4-day history of worsening weakness, numbness, vision changes.  # Multiple sclerosis: Patient presented on 11/22/19 for 4-day history of worsening weakness, numbness, and vision changes. Patient was recently hospitalized from 11/06/19 to 11/11/19 at which time she received a new diagnosis of MS. Patient was given a 5 day course of solumedrol with improvement in symptoms. During this encounter, patient received MR brain/orbits which revealed multiple enhancing plaques, overall decreased compared to 11/07/19 scan, but with new/enhancing plaque in superficial left pons. Neurology was consulted *Neurology consulted, we appreciate their continued recommendations *3 days of Solu-Medrol 1 g daily, protonix 40 mg IV daily *Outpatient followup with Danielle Baker on April 12th  # T1DM: Home regimen of 80 units long-acting at bedtime + humalog 23 units three times daily. Glucose of 391 on presentation. Urinalysis with >= 500 glucose, negative for ketones. BMP on admission was with elevated anion gap of 16. Patient has received 23 units x2 since admission and 90 units long acting (administered at 1408). *Lantus 90 units at bedtime *Aspart 23 units TID with meals *SSI with meals and at night  Dispo: Admit patient to Inpatient with expected length of stay greater than 2 midnights.  Signed: Katherine Roan,  MD 11/22/2019, 1:52 PM

## 2019-11-22 NOTE — Telephone Encounter (Signed)
Noted, thank you

## 2019-11-22 NOTE — Hospital Course (Signed)
Patient presented on 11/22/19 for 4-day history of worsening weakness, numbness, and vision changes. Patient was recently hospitalized from 11/06/19 to 11/11/19 at which time she received a new diagnosis of MS. Patient was given a 5 day course of solumedrol with improvement in symptoms. During this encounter, patient received MR brain/orbits which revealed multiple enhancing plaques, overall decreased compared to 11/07/19 scan, but with new/enhancing plaque in superficial left pons. Neurology consulted and patient started on ***  MR Orbits W WO Contrast (11/22/19): MR Brain W WO Contrast (11/22/19): IMPRESSION: 1. Demyelinating/multiple sclerosis pattern with multiple enhancing plaques. There has been an overall decrease in plaque enhancement when compared to 11/07/2019, although there is a new and enhancing plaque seen in the superficial left pons. 2. Negative for optic neuritis.

## 2019-11-22 NOTE — ED Notes (Signed)
This RN attempted report, was told 3W RN Asher Muir would call back. Will attempt report again in if call not returned

## 2019-11-23 LAB — COMPREHENSIVE METABOLIC PANEL
ALT: 58 U/L — ABNORMAL HIGH (ref 0–44)
AST: 73 U/L — ABNORMAL HIGH (ref 15–41)
Albumin: 3.2 g/dL — ABNORMAL LOW (ref 3.5–5.0)
Alkaline Phosphatase: 88 U/L (ref 38–126)
Anion gap: 14 (ref 5–15)
BUN: 17 mg/dL (ref 6–20)
CO2: 18 mmol/L — ABNORMAL LOW (ref 22–32)
Calcium: 9.1 mg/dL (ref 8.9–10.3)
Chloride: 104 mmol/L (ref 98–111)
Creatinine, Ser: 0.73 mg/dL (ref 0.44–1.00)
GFR calc Af Amer: >60 mL/min (ref >60)
GFR calc non Af Amer: >60 mL/min (ref >60)
Glucose, Bld: 339 mg/dL — ABNORMAL HIGH (ref 70–99)
Potassium: 3.9 mmol/L (ref 3.5–5.1)
Sodium: 136 mmol/L (ref 135–145)
Total Bilirubin: 0.5 mg/dL (ref 0.3–1.2)
Total Protein: 6.3 g/dL — ABNORMAL LOW (ref 6.5–8.1)

## 2019-11-23 LAB — CBC
HCT: 41.4 % (ref 36.0–46.0)
Hemoglobin: 13.2 g/dL (ref 12.0–15.0)
MCH: 27.6 pg (ref 26.0–34.0)
MCHC: 31.9 g/dL (ref 30.0–36.0)
MCV: 86.4 fL (ref 80.0–100.0)
Platelets: 301 10*3/uL (ref 150–400)
RBC: 4.79 MIL/uL (ref 3.87–5.11)
RDW: 13.8 % (ref 11.5–15.5)
WBC: 24.7 10*3/uL — ABNORMAL HIGH (ref 4.0–10.5)
nRBC: 0 % (ref 0.0–0.2)

## 2019-11-23 LAB — GLUCOSE, CAPILLARY
Glucose-Capillary: 320 mg/dL — ABNORMAL HIGH (ref 70–99)
Glucose-Capillary: 268 mg/dL — ABNORMAL HIGH (ref 70–99)
Glucose-Capillary: 202 mg/dL — ABNORMAL HIGH (ref 70–99)
Glucose-Capillary: 257 mg/dL — ABNORMAL HIGH (ref 70–99)

## 2019-11-23 MED ORDER — METHOCARBAMOL 500 MG PO TABS
1000.0000 mg | ORAL_TABLET | Freq: Three times a day (TID) | ORAL | Status: AC | PRN
  Administered 2019-11-23 (×3): 1000 mg via ORAL
  Filled 2019-11-23 (×3): qty 2

## 2019-11-23 MED ORDER — PANTOPRAZOLE SODIUM 40 MG PO TBEC
40.0000 mg | DELAYED_RELEASE_TABLET | Freq: Every day | ORAL | Status: DC
  Administered 2019-11-23 – 2019-11-24 (×2): 40 mg via ORAL
  Filled 2019-11-23 (×2): qty 1

## 2019-11-23 MED ORDER — INSULIN DETEMIR 100 UNIT/ML ~~LOC~~ SOLN
40.0000 [IU] | Freq: Two times a day (BID) | SUBCUTANEOUS | Status: DC
  Administered 2019-11-23 – 2019-11-24 (×3): 40 [IU] via SUBCUTANEOUS
  Filled 2019-11-23 (×4): qty 0.4

## 2019-11-23 NOTE — Progress Notes (Signed)
  Subjective:  Patient seen at bedside, patient reports continued symptoms as before. Continued numbness and weakness, moreso on the left side.  Objective:    Vital Signs (last 24 hours): Vitals:   11/22/19 2230 11/23/19 0055 11/23/19 0119 11/23/19 0400  BP: (!) 143/92 138/85  109/68  Pulse: (!) 105 100  97  Resp: 20 20  19   Temp: 98 F (36.7 C) 98.7 F (37.1 C)  98.1 F (36.7 C)  TempSrc: Oral Oral  Oral  SpO2: 98% 96%  98%  Weight:   115 kg   Height:   5\' 4"  (1.626 m)     Physical Exam: General Alert and answers questions appropriately, no acute distress  Cardiac Regular rate and rhythm, no murmurs, rubs, or gallops  Pulmonary Clear to auscultation bilaterally without wheezes, rhonchi, or rales  Neurology Left-sided weakness and decreased sensation, particularly on left arm and leg   CBC Latest Ref Rng & Units 11/23/2019 11/21/2019 11/08/2019  WBC 4.0 - 10.5 K/uL 24.7(H) 13.8(H) 9.6  Hemoglobin 12.0 - 15.0 g/dL 01/21/2020 11/10/2019 96.2  Hematocrit 36.0 - 46.0 % 41.4 43.3 40.7  Platelets 150 - 400 K/uL 301 348 409(H)   BMP Latest Ref Rng & Units 11/23/2019 11/21/2019 11/08/2019  Glucose 70 - 99 mg/dL 01/21/2020) 11/10/2019) 921(J)  BUN 6 - 20 mg/dL 17 13 12   Creatinine 0.44 - 1.00 mg/dL 941(D 408(X  Sodium 135 - 145 mmol/L 136 137 139  Potassium 3.5 - 5.1 mmol/L 3.9 4.2 3.9  Chloride 98 - 111 mmol/L 104 101 109  CO2 22 - 32 mmol/L 18(L) 20(L) 22  Calcium 8.9 - 10.3 mg/dL 9.1 9.0 4.48)    Assessment/Plan:   Active Problems:   Multiple sclerosis exacerbation (HCC)  Patient is a 24 year old female with past medical history of multiple sclerosis, type 1 diabetes mellitus, pancreatitis who presented on 11/22/2019 with a 4-day history of worsening weakness, numbness, vision changes.  # Multiple sclerosis: Recent diagnosis during hospitalization from 3/21-3/26.  Patient with improvement during that hospitalization with 5-day course of Solu-Medrol, but gradual worsening over the past 4  days. *Neurology consulted, we appreciate their continued recommendations *Day 2 of 3 of Solu-Medrol 1 g daily, Protonix 40 mg IV daily *Outpatient follow-up with Dr. 25 on April 12  # T1DM: Home regimen of 80 units long-acting at bedtime + Humalog 23 units 3 times daily with meals.  Glucose of 391 on presentation with mildly elevated anion gap, but no ketones on urinalysis.  Anion gap within normal limits on a.m. BMP. Glucose > 300 overnight, received 20 units short-acting *Diabetic coordinator following, we appreciate their recommendations *Levemir 40 units BID - will monitor glucose during day and consider increasing to 50 units BID *Aspart 23 units TID with meals *SSI with meals and at night  PT/OT: Consulted Diet: Carb modified DVT Ppx: Lovenox 40 mg daily Admit Status: Inpatient Dispo: Anticipated discharge in approximately 2-3 days  07-25-1986, MD 11/23/2019, 6:33 AM

## 2019-11-23 NOTE — Evaluation (Signed)
Physical Therapy Evaluation Patient Details Name: Danielle Baker MRN: [id number] DOB: April 29, 1996 Today's Date: 11/23/2019   History of Present Illness  Pt is 24 yo female with MS recently dx last month.  Additionally, PMH of DM and pancreatitis.  She presented with worsening numbness, weakness, and vision changes.  MRI revealed new lesions consistent with MS.  Clinical Impression  Pt admitted with above diagnosis. Pt recently dx with MS and was hospitalized at the end of March.  Her symptoms had improved with steriods, but have now worsened again.  Pt found to have new lesions.  Pt reports numbness (L arm,leg, face), blurry vision in L eye, dizziness, weakness, and fatigue.  She has been able to ambulate independently but fatigues easily and with cautious gait speed.  Pt was unable to perform dynamic gait task (looking L/R/Up/Down) due to dizziness.  She was educated on energy conservation, safety, f/u with neurologist, and habituation exercises. Pt currently with functional limitations due to the deficits listed below (see PT Problem List). Pt will benefit from skilled PT to increase their independence and safety with mobility to allow discharge to the venue listed below.       Follow Up Recommendations No PT follow up    Equipment Recommendations  None recommended by PT    Recommendations for Other Services       Precautions / Restrictions Precautions Precautions: Fall      Mobility  Bed Mobility Overal bed mobility: Independent                Transfers Overall transfer level: Needs assistance Equipment used: None Transfers: Sit to/from Stand Sit to Stand: Supervision         General transfer comment: supervision for safety; pt has been ambulating independently  Ambulation/Gait Ambulation/Gait assistance: Supervision Gait Distance (Feet): 200 Feet Assistive device: None       General Gait Details: decreased gait speed; cautious gait; unable to perform head turns  due to dizziness; no LOB  Stairs         General stair comments: Discussed stair safety at home (having supervision, using rails)  Wheelchair Mobility    Modified Rankin (Stroke Patients Only)       Balance Overall balance assessment: Needs assistance   Sitting balance-Leahy Scale: Normal       Standing balance-Leahy Scale: Good                               Pertinent Vitals/Pain Pain Assessment: Faces Pain Score: 2  Pain Location: Shooting pain in R LE when she flexes her neck otherwise no pain    Home Living Family/patient expects to be discharged to:: Private residence Living Arrangements: Spouse/significant other Available Help at Discharge: Family;Available 24 hours/day Type of Home: House Home Access: Stairs to enter Entrance Stairs-Rails: Can reach both Entrance Stairs-Number of Steps: 3 Home Layout: Multi-level Home Equipment: None Additional Comments: sister living with them next couple of yrs per pt report, helps pt with her children. 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 - pt does reports difficulty doing her job and normal activities since recent dx of MS - reports fatigues easily and just feels bad     Hand Dominance   Dominant Hand: Right  Extremity/Trunk Assessment   Upper Extremity Assessment Upper Extremity Assessment: LUE deficits/detail RUE Deficits / Details: no deficits reported LUE Deficits / Details: difficulty with grip and strength throughout LUE Sensation: decreased light touch;decreased proprioception LUE Coordination: decreased fine motor;decreased gross motor    Lower Extremity Assessment Lower Extremity Assessment: LLE deficits/detail;RLE deficits/detail RLE Deficits / Details: Demonstrating functional strength and normal ROM LLE Deficits / Details:  Demonstrating functional strength and normal ROM LLE Sensation: decreased light touch;decreased proprioception(upper leg and lower leg numb but not foot)    Cervical / Trunk Assessment Cervical / Trunk Assessment: Normal  Communication   Communication: No difficulties  Cognition Arousal/Alertness: Awake/alert Behavior During Therapy: WFL for tasks assessed/performed Overall Cognitive Status: Within Functional Limits for tasks assessed                                        General Comments  In addition to numbness and weakness pt with c/o dizziness and blurry vision L eye.  Pt was educated on and demonstrated seated habituation exercises including smooth pursuit and gaze stabilzation and VOR cancellation.  Pt additionally educated on energy conservation and maintaining cool temperature to assist with MS flair.  Pt with good support system at home.  Plans to f/u with neurologist on Monday.     Exercises     Assessment/Plan    PT Assessment Patient needs continued PT services  PT Problem List Decreased strength;Decreased mobility;Decreased activity tolerance;Decreased balance;Decreased coordination;Impaired sensation       PT Treatment Interventions Therapeutic activities;Gait training;Therapeutic exercise;Patient/family education;Stair training;Balance training;Functional mobility training;Neuromuscular re-education    PT Goals (Current goals can be found in the Care Plan section)  Acute Rehab PT Goals Patient Stated Goal: return to work and caring for kids PT Goal Formulation: With patient Time For Goal Achievement: 12/07/19 Potential to Achieve Goals: Good    Frequency Min 3X/week   Barriers to discharge        Co-evaluation               AM-PAC PT "6 Clicks" Mobility  Outcome Measure Help needed turning from your back to your side while in a flat bed without using bedrails?: None Help needed moving from lying on your back to sitting on the side  of a flat bed without using bedrails?: None Help needed moving to and from a bed to a chair (including a wheelchair)?: None Help needed standing up from a chair using your arms (e.g., wheelchair or bedside chair)?: None Help needed to walk in hospital room?: None Help needed climbing 3-5 steps with a railing? : A Little 6 Click Score: 23    End of Session   Activity Tolerance: Patient tolerated treatment well Patient left: in bed;with family/visitor present;with call bell/phone within reach Nurse Communication: Mobility status PT Visit Diagnosis: Unsteadiness on feet (R26.81);Dizziness and giddiness (R42)    Time: 2831-5176 PT Time Calculation (min) (ACUTE ONLY): 28 min   Charges:   PT Evaluation $PT Eval Low Complexity: 1 Low PT Treatments $Therapeutic Activity: 8-22 mins        Royetta Asal, PT Acute Rehab Services Pager [phone] [facility] [phone] [facility] [phone]   Rayetta Humphrey 11/23/2019, 5:00 PM

## 2019-11-23 NOTE — Progress Notes (Signed)
Inpatient Diabetes Program Recommendations  AACE/ADA: New Consensus Statement on Inpatient Glycemic Control   Target Ranges:  Prepandial:   less than 140 mg/dL      Peak postprandial:   less than 180 mg/dL (1-2 hours)      Critically ill patients:  140 - 180 mg/dL   Results for Danielle Baker, Danielle Baker (MRN [id number]) as of 11/23/2019 10:17  Ref. Range 11/22/2019 07:26 11/22/2019 10:37 11/22/2019 11:21 11/22/2019 13:44 11/22/2019 14:08 11/22/2019 17:21 11/22/2019 21:42 11/23/2019 06:35 11/23/2019 8:50  Glucose-Capillary Latest Ref Range: 70 - 99 mg/dL 798 (H) 921 (H)  Novolog 23 units for breakfast 391 (H)   Novolog 23 units @ 12:07 for lunch 99      Lantus 90 units 105 (H)   NO Meal Coverage for supper 382 (H)  Novolog 5 units 320 (H)  Novolog 15 units @ 6:36       Novolog 23 units for breakfast   Review of Glycemic Control  Outpatient Diabetes medications: Toujeo 80 units daily, Humalog 23 units TID with meals Current orders for Inpatient glycemic control: Levemir 40 units BID, Novolog 23 units TID with meals, Novolog 0-20 units TID with meals, Novolog 0-5 units QHS; Solumedrol 1000 mg daily  Inpatient Diabetes Program Recommendations:   Insulin - Basal: Patient received Lantus 90 units on 11/22/19 and fasting glucose 320 mg/dl today. Noted Lantus changed to Levemir 40 units BID today. If steroids are continued, please consider increasing Levemir to 50 units BID.  Insulin - Meal Coverage: Noted Novolog 23 units with meals was NOT GIVEN with supper last night and as a result bedtime glucose up to 382 mg/dl. RNs, please administer meal coverage if premeal glucose is 80 mg/dl or higher and patient is eating at least 50% of meals.  Thanks, Orlando Penner, RN, MSN, CDE Diabetes Coordinator Inpatient Diabetes Program [phone] (Team Pager from 8am to 5pm)

## 2019-11-24 DIAGNOSIS — E1065 Type 1 diabetes mellitus with hyperglycemia: Secondary | ICD-10-CM

## 2019-11-24 LAB — BASIC METABOLIC PANEL
Anion gap: 11 (ref 5–15)
BUN: 14 mg/dL (ref 6–20)
CO2: 21 mmol/L — ABNORMAL LOW (ref 22–32)
Calcium: 8.6 mg/dL — ABNORMAL LOW (ref 8.9–10.3)
Chloride: 107 mmol/L (ref 98–111)
Creatinine, Ser: 0.62 mg/dL (ref 0.44–1.00)
GFR calc Af Amer: >60 mL/min (ref >60)
GFR calc non Af Amer: >60 mL/min (ref >60)
Glucose, Bld: 161 mg/dL — ABNORMAL HIGH (ref 70–99)
Potassium: 4.1 mmol/L (ref 3.5–5.1)
Sodium: 139 mmol/L (ref 135–145)

## 2019-11-24 LAB — CBC
HCT: 38 % (ref 36.0–46.0)
Hemoglobin: 12 g/dL (ref 12.0–15.0)
MCH: 27.1 pg (ref 26.0–34.0)
MCHC: 31.6 g/dL (ref 30.0–36.0)
MCV: 85.8 fL (ref 80.0–100.0)
Platelets: 312 10*3/uL (ref 150–400)
RBC: 4.43 MIL/uL (ref 3.87–5.11)
RDW: 14.1 % (ref 11.5–15.5)
WBC: 17.8 10*3/uL — ABNORMAL HIGH (ref 4.0–10.5)
nRBC: 0 % (ref 0.0–0.2)

## 2019-11-24 LAB — GLUCOSE, CAPILLARY
Glucose-Capillary: 221 mg/dL — ABNORMAL HIGH (ref 70–99)
Glucose-Capillary: 333 mg/dL — ABNORMAL HIGH (ref 70–99)

## 2019-11-24 MED ORDER — TOUJEO MAX SOLOSTAR 300 UNIT/ML ~~LOC~~ SOPN: 40.0000 [IU] | PEN_INJECTOR | Freq: Two times a day (BID) | SUBCUTANEOUS | 0 refills | Status: DC

## 2019-11-24 NOTE — Plan of Care (Signed)
  Problem: Education: Goal: Knowledge of General Education information will improve Description: Including pain rating scale, medication(s)/side effects and non-pharmacologic comfort measures Outcome: Adequate for Discharge   Problem: Health Behavior/Discharge Planning: Goal: Ability to manage health-related needs will improve Outcome: Adequate for Discharge   Problem: Clinical Measurements: Goal: Ability to maintain clinical measurements within normal limits will improve Outcome: Adequate for Discharge Goal: Will remain free from infection Outcome: Adequate for Discharge Goal: Diagnostic test results will improve Outcome: Adequate for Discharge   Problem: Activity: Goal: Risk for activity intolerance will decrease Outcome: Adequate for Discharge   Problem: Nutrition: Goal: Adequate nutrition will be maintained Outcome: Adequate for Discharge   Problem: Coping: Goal: Level of anxiety will decrease Outcome: Adequate for Discharge   Problem: Elimination: Goal: Will not experience complications related to bowel motility Outcome: Adequate for Discharge   Problem: Pain Managment: Goal: General experience of comfort will improve Outcome: Adequate for Discharge   Problem: Safety: Goal: Ability to remain free from injury will improve Outcome: Adequate for Discharge   Problem: Skin Integrity: Goal: Risk for impaired skin integrity will decrease Outcome: Adequate for Discharge   

## 2019-11-24 NOTE — Evaluation (Signed)
Occupational Therapy Evaluation Patient Details Name: ARLISHA PATALANO MRN: [id number] DOB: [birth year] Today's Date: 11/24/2019    History of Present Illness Pt is 24 yo female with MS recently dx last month.  Additionally, PMH of DM and pancreatitis.  She presented with worsening numbness, weakness, and vision changes.  MRI revealed new lesions consistent with MS.   Clinical Impression   Pt admitted with above and presents to OT with impairments impacting ability to complete ADLs and IADLs at [facility].  Pt reports symptoms had improved with steroids but have worsened again.  Pt reports worsened numbness (Lt arm, leg, face), dizziness, weakness, and fatigue.  Pt demonstrates ability to ambulate independently in room to gather shoes from closet and complete toilet transfers, however reports that she fatigues quickly.  Provided pt with Energy Conservation handout and engaged in lengthy discussion about techniques to increase safety and independence with ADLs and IADLs.  Recommend that pt use shower chair in home for energy conservation and safety as pt reports dizziness when bending forward, pt reports understanding and reports that her mother has one she can have.  Discussed child care tasks and tasks that she can complete in modified manner and what tasks to have family members assist with.  Provided pt with Fine motor control handout and discussed activities to complete with Lt and Rt UE, as well as incorporated more functional (mom) tasks to address fine motor control.  Pt very pleased with handouts and education.  Pt reports just wanting to go home to her children.  Pt will benefit not require follow up OT at this time.  Has been encouraged to follow up with neurologist and continue with gaze stabilization, energy conservation, and fine motor control tasks.    Follow Up Recommendations  No OT follow up    Equipment Recommendations  Other (comment)(recommended shower seat, reports mom has one she can  have)       Precautions / Restrictions Precautions Precautions: Fall Restrictions Weight Bearing Restrictions: No      Mobility Bed Mobility Overal bed mobility: Independent                Transfers Overall transfer level: Needs assistance Equipment used: None Transfers: Sit to/from Stand Sit to Stand: Supervision         General transfer comment: supervision for safety; pt has been ambulating independently        ADL either performed or assessed with clinical judgement   ADL Overall ADL's : Modified independent                                       General ADL Comments: pt does have dizziness with bending over and reports that her husband or sister is providing majority of the care for her children (baths, diapers, etc). pt educated on gaze stabilization with self-care tasks, and recommendation for use of shower seat in shower for increased safety and energy conservation with bending.     Vision Baseline Vision/History: Wears glasses Wears Glasses: At all times Patient Visual Report: Blurring of vision Vision Assessment?: Vision impaired- to be further tested in functional context Additional Comments: reports vision is "better" but still blurry            Pertinent Vitals/Pain Pain Location: Shooting pain in R LE when she flexes her neck otherwise no pain     Hand Dominance Right   Extremity/Trunk Assessment Upper  Extremity Assessment Upper Extremity Assessment: LUE deficits/detail;RUE deficits/detail RUE Deficits / Details: mild weakness in grasp RUE Coordination: decreased fine motor LUE Deficits / Details: difficulty with grip and strength throughout LUE Sensation: decreased light touch;decreased proprioception LUE Coordination: decreased fine motor;decreased gross motor   Lower Extremity Assessment RLE Deficits / Details: Demonstrating functional strength and normal ROM LLE Deficits / Details: Demonstrating functional strength  and normal ROM LLE Sensation: decreased light touch;decreased proprioception(upper and lower leg numb, but not in foot)   Cervical / Trunk Assessment Cervical / Trunk Assessment: Normal   Communication Communication Communication: No difficulties   Cognition Arousal/Alertness: Awake/alert Behavior During Therapy: WFL for tasks assessed/performed Overall Cognitive Status: Within Functional Limits for tasks assessed                                                Home Living Family/patient expects to be discharged to:: Private residence Living Arrangements: Spouse/significant other;Other relatives(sister plans to stay as needed) Available Help at Discharge: Family;Available 24 hours/day Type of Home: House Home Access: Stairs to enter [employer] of Steps: 3 Entrance Stairs-Rails: Can reach both Home Layout: Multi-level Alternate Level Stairs-Number of Steps: 7   Bathroom Shower/Tub: Occupational psychologist: Standard     Home Equipment: None   Additional Comments: sister living with them next couple of yrs per pt report, helps pt with her children. 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 - pt does reports difficulty doing her job and normal activities since recent dx of MS - reports fatigues easily and just feels bad        OT Problem List: Decreased strength;Decreased activity tolerance;Decreased coordination;Decreased knowledge of use of DME or AE;Impaired sensation         OT Goals(Current goals can be found in the care plan section) Acute Rehab OT Goals Patient Stated Goal: to go home to her kids OT Goal Formulation: All assessment and education complete, DC therapy   AM-PAC OT "6 Clicks" Daily Activity     Outcome Measure Help from another person eating meals?:  None Help from another person taking care of personal grooming?: None Help from another person toileting, which includes using toliet, bedpan, or urinal?: None Help from another person bathing (including washing, rinsing, drying)?: None Help from another person to put on and taking off regular upper body clothing?: None Help from another person to put on and taking off regular lower body clothing?: None 6 Click Score: 24   End of Session Nurse Communication: Mobility status;Precautions  Activity Tolerance: Patient tolerated treatment well Patient left: with call bell/phone within reach;in bed  OT Visit Diagnosis: Unsteadiness on feet (R26.81)                Time: 3762-8315 OT Time Calculation (min): 40 min Charges:  OT General Charges $OT Visit: 1 Visit OT Evaluation $OT Eval Low Complexity: 1 Low OT Treatments $Self Care/Home Management : 8-22 mins $Therapeutic Activity: 8-22 mins   [place], [place] 11/24/2019, 10:19 AM

## 2019-11-24 NOTE — Progress Notes (Signed)
  Subjective:  Patient seen at bedside. Patient reports her left hand weakness has improved but otherwise her symptoms are about the same. Patient articulates that she wants to go home today, feels that she will improve after having received the steroids, and needs to go take care of her kids.  Objective:    Vital Signs (last 24 hours): Vitals:   11/23/19 1622 11/23/19 2005 11/24/19 0024 11/24/19 0350  BP: 126/73 120/77 110/67 (!) 93/50  Pulse: 98 94 88 76  Resp:  19 20 19   Temp: 98.4 F (36.9 C) 98.2 F (36.8 C) 98 F (36.7 C) 98.1 F (36.7 C)  TempSrc: Oral Oral Oral Oral  SpO2: 98% 98% 99% 99%  Weight:      Height:       Physical Exam: General Alert and answers questions appropriately, no acute distress  Cardiac Regular rate and rhythm, no murmurs, rubs, or gallops  Pulmonary Clear to auscultation bilaterally without wheezes, rhonchi, or rales   CBC Latest Ref Rng & Units 11/24/2019 11/23/2019 11/21/2019  WBC 4.0 - 10.5 K/uL 17.8(H) 24.7(H) 13.8(H)  Hemoglobin 12.0 - 15.0 g/dL 01/21/2020 32.2 02.5  Hematocrit 36.0 - 46.0 % 38.0 41.4 43.3  Platelets 150 - 400 K/uL 312 301 348   BMP Latest Ref Rng & Units 11/24/2019 11/23/2019 11/21/2019  Glucose 70 - 99 mg/dL 01/21/2020) 062(B) 762(G)  BUN 6 - 20 mg/dL 14 17 13   Creatinine 0.44 - 1.00 mg/dL 315(V 7.61  Sodium 135 - 145 mmol/L 139 136 137  Potassium 3.5 - 5.1 mmol/L 4.1 3.9 4.2  Chloride 98 - 111 mmol/L 107 104 101  CO2 22 - 32 mmol/L 21(L) 18(L) 20(L)  Calcium 8.9 - 10.3 mg/dL 6.07) 9.1 9.0     Assessment/Plan:   Active Problems:   Multiple sclerosis exacerbation (HCC)  Patient is a 24 year old female with past medical history of multiple sclerosis, type 1 diabetes mellitus, pancreatitis who presented on 11/22/2019 with a 4-day history of worsening weakness, numbness, vision changes.  # Multiple sclerosis: Recent diagnosis during hospitalization from 3/21-3/26.  Patient with improvement during the hospitalization with 5-day course  of Solu-Medrol, but gradual worsening over the 4 days prior to presentation. Patient symptoms are very mildly improved during this hospitalization. *Patient to discharge today with outpatient neurology followup on Monday *Day 3 of 3 of Solu-Medrol 1 g daily, Protonix 40 mg IV daily *Outpatient follow-up with Dr. [id number] on April 12  # T1DM: Home regimen of 80 units long-acting at bedtime + Humalog 23 units 3 times daily with meals.  Glucose of 391 on presentation with mildly elevated anion gap, but no ketones on urinalysis. Anion gap WNL on AM BMP. Glucose with improved control, last BG of 161 *Levemir 40 units BID - will discharge on long-acting 40 units BID regimen *Aspart 23 units TID with meals *SSI with meals and at night  PT/OT: PT with no followup/equipment recommended Diet: Carb modified DVT Ppx: Lovenox 40 mg daily Admit Status: Inpatient Dispo: Anticipated discharge today   Epimenio Foot, MD 11/24/2019, 5:54 AM

## 2019-11-24 NOTE — Progress Notes (Signed)
Physical Therapy Treatment Patient Details Name: Danielle Baker MRN: [id number] DOB: [birth year] Today's Date: 11/24/2019    History of Present Illness Pt is 24 yo female with MS recently dx last month.  Additionally, PMH of DM and pancreatitis.  She presented with worsening numbness, weakness, and vision changes.  MRI revealed new lesions consistent with MS.    PT Comments    Pt with improved tolerance for ambulation, but PT educated pt on energy conservation to ensure she was not overdoing distance. Pt holds head very still during gait, due to central dizziness. Pt educated on gaze stabilization exercises, administered handout for pt review upon d/c. Pt with good understanding of energy conservation for home, no further questions for PT.    Follow Up Recommendations  No PT follow up     Equipment Recommendations  None recommended by PT    Recommendations for Other Services       Precautions / Restrictions Precautions Precautions: Fall Restrictions Weight Bearing Restrictions: No    Mobility  Bed Mobility Overal bed mobility: Modified Independent             General bed mobility comments: increased time, use of bedrails.  Transfers Overall transfer level: Needs assistance Equipment used: None Transfers: Sit to/from Stand Sit to Stand: Supervision         General transfer comment: supervision for safety, increased time to rise and steady as pt is cautious with mobility.  Ambulation/Gait Ambulation/Gait assistance: Supervision Gait Distance (Feet): 300 Feet Assistive device: None Gait Pattern/deviations: Step-through pattern;Decreased stride length;Wide base of support Gait velocity: decr   General Gait Details: supervision for safety, pt keeping head relatively straight forward and avoids head motion if possible secondary to central dizziness. no overt unsteadiness noted, just very cautious with mobility   Stairs         General stair comments:  Discussed stair safety at home (having supervision, using rails)   Wheelchair Mobility    Modified Rankin (Stroke Patients Only)       Balance Overall balance assessment: Needs assistance   Sitting balance-Leahy Scale: Normal       Standing balance-Leahy Scale: Good                              Cognition Arousal/Alertness: Awake/alert Behavior During Therapy: WFL for tasks assessed/performed Overall Cognitive Status: Within Functional Limits for tasks assessed                                        Exercises Other Exercises Other Exercises: Gaze stabilization exercises: horizontal and vertical, up to 1 minute total at a time, 3 times a day. Progressing to a total of 12 minutes a day. Medbridge printout given, access code [email or web address]    General Comments        Pertinent Vitals/Pain Pain Assessment: Faces Faces Pain Scale: Hurts little more Pain Location: LEs with forward cervical flexion (pt avoided during session) Pain Descriptors / Indicators: Discomfort;Shooting Pain Intervention(s): Limited activity within patient's tolerance;Monitored during session    Home Living                      Prior Function            PT Goals (current goals can now be found in the care plan section) Acute Rehab PT Goals Patient  Stated Goal: return to work and caring for kids PT Goal Formulation: With patient Time For Goal Achievement: 12/07/19 Potential to Achieve Goals: Good Progress towards PT goals: Progressing toward goals    Frequency    Min 3X/week      PT Plan Current plan remains appropriate    Co-evaluation              AM-PAC PT "6 Clicks" Mobility   Outcome Measure  Help needed turning from your back to your side while in a flat bed without using bedrails?: None Help needed moving from lying on your back to sitting on the side of a flat bed without using bedrails?: None Help needed moving to and from a bed to a  chair (including a wheelchair)?: None Help needed standing up from a chair using your arms (e.g., wheelchair or bedside chair)?: None Help needed to walk in hospital room?: None Help needed climbing 3-5 steps with a railing? : None 6 Click Score: 24    End of Session   Activity Tolerance: Patient tolerated treatment well Patient left: in bed;with family/visitor present;with call bell/phone within reach Nurse Communication: Mobility status PT Visit Diagnosis: Unsteadiness on feet (R26.81);Dizziness and giddiness (R42)     Time: 1031-5945 PT Time Calculation (min) (ACUTE ONLY): 27 min  Charges:  $Gait Training: 8-22 mins $Self Care/Home Management: 8-22                    Marisa Cyphers, PT Acute Rehabilitation Services Pager [phone]  Office [phone]   [name] D Natasha Paulson 11/24/2019, 1:05 PM

## 2019-11-24 NOTE — Progress Notes (Signed)
NEUROLOGY PROGRESS NOTE   Subjective: Patient does not feel significant improvement,   Exam: Vitals:   11/24/19 0350 11/24/19 0826  BP: (!) 93/50 133/85  Pulse: 76 96  Resp: 19 18  Temp: 98.1 F (36.7 C) 97.8 F (36.6 C)  SpO2: 99% 100%     Physical Exam  Constitutional: Appears well-developed and well-nourished.  Psych: Affect appropriate to situation Eyes: No scleral injection HENT: No OP obstrucion Head: Normocephalic.  Cardiovascular: Normal rate and regular rhythm.  Respiratory: Effort normal, non-labored breathing GI: Soft.  No distension. There is no tenderness.  Skin: WDI   Neuro:  Mental Status: Alert, oriented, thought content appropriate.  Speech fluent without evidence of aphasia.  Able to follow 3 step commands without difficulty. Cranial Nerves: II:  Visual fields grossly normal,  III,IV, VI: ptosis not present, extra-ocular motions intact bilaterally pupils equal, round, reactive to light and accommodation V,VII: smile symmetric, facial light touch sensation normal bilaterally VIII: hearing normal bilaterally IX,X: Palate rises midline XI: bilateral shoulder shrug XII: midline tongue extension Motor: 4/5 strength in the upper extremities throughout.  4/5 strength in the lower extremities throughout Sensory: Decreased sensation on no left arm greater than the right arm.  Decreased sensation on the left leg greater than the right leg. Deep Tendon Reflexes: 2+ and symmetric throughout Plantars: Right: downgoing   Left: downgoing Cerebellar: normal finger-to-nose    Medications:  Scheduled: . enoxaparin (LOVENOX) injection  40 mg Subcutaneous Q24H  . escitalopram  20 mg Oral Daily  . insulin aspart  0-20 Units Subcutaneous TID WC  . insulin aspart  0-5 Units Subcutaneous QHS  . insulin aspart  23 Units Subcutaneous TID WC  . insulin detemir  40 Units Subcutaneous BID  . pantoprazole  40 mg Oral Daily   Continuous: . methylPREDNISolone  (SOLU-MEDROL) injection 1,000 mg (11/23/19 1106)    Pertinent Labs/Diagnostics: No new significant labs   Felicie Morn PA-C [facility] [phone]  Assessment: 24 year old female with type 1 diabetes and newly diagnosed MS in March who presented to emergency department with blurred vision, left-sided numbness.  Lhermitte sign when she looks down toward her chin.  At this point exam has not changed significantly other than the fact that she feels weaker.  Today will be the final day of Solu-Medrol.  She does have an appointment with Dr. Epimenio Foot on 11/28/2019.  To start disease modifying therapy as soon as possible.  Discussion with patient about having 5 doses of Solu-Medrol she prefers to only have the 3 that we have ordered.  Patient is privy of her upcoming appointment and has already received paperwork to fill out.  At this time neurology will sign off.  If you have any other questions please feel free to call us.   11/24/2019, 8:56 AM   NEUROHOSPITALIST ADDENDUM Performed a face to face diagnostic evaluation.   I have reviewed the contents of history and physical exam as documented by PA/ARNP/Resident and agree with above documentation.  I have discussed and formulated the above plan as documented. Edits to the note have been made as needed.   Spoke to the patient this morning, states that she feels about the same.  Does not feel she has had much improvement with the steroids-discussed about continuing steroids for 2 more days.  Patient preferred to go home and follow-up with Dr. Epimenio Foot on Monday.   Neurology will be available as needed    Georgiana Spinner Vonte Rossin MD [facility] [phone]   If 7pm to  7am, please call on call as listed on AMION.

## 2019-11-24 NOTE — Discharge Summary (Signed)
Name: Danielle Baker MRN: [id number] DOB: Jun 09, 1996 24 y.o. PCP: Marco Collie, MD  Date of Admission: 11/21/2019  7:21 PM Date of Discharge: 11/24/2019 Attending Physician: Aldine Contes, MD  Discharge Diagnosis: 1. Multiple sclerosis with exacerbation 2. Type 1 diabetes mellitus with hyperglycemia  Discharge Medications: Allergies as of 11/24/2019      Reactions   Enalapril Other (See Comments)   "Weak and blacked out" couldn't stand up. Pt states it dropped her BP too low    Morphine And Related Rash      Medication List    TAKE these medications   acetaminophen 325 MG tablet Commonly known as: TYLENOL Take 650 mg by mouth every 6 (six) hours as needed for mild pain.   dimenhyDRINATE 50 MG tablet Commonly known as: DRAMAMINE Take 50 mg by mouth every 8 (eight) hours as needed for nausea or dizziness.   escitalopram 20 MG tablet Commonly known as: LEXAPRO Take 20 mg by mouth daily.   HumaLOG KwikPen 100 UNIT/ML KwikPen Generic drug: insulin lispro Inject 23 Units into the skin in the morning, at noon, and at bedtime.   ibuprofen 200 MG tablet Commonly known as: ADVIL Take 400 mg by mouth every 6 (six) hours as needed for headache or moderate pain.   Toujeo Max SoloStar 300 UNIT/ML Solostar Pen Generic drug: insulin glargine (2 Unit Dial) Inject 40 Units into the skin 2 (two) times daily. What changed:   how much to take  when to take this   Vitamin D (Ergocalciferol) 1.25 MG (50000 UNIT) Caps capsule Commonly known as: DRISDOL Take 1 capsule (50,000 Units total) by mouth every 7 (seven) days.       Disposition and follow-up:   Danielle Baker was discharged from [facility] in Stable condition.  At the hospital follow up visit please address:  1.  Please assess patient's insulin regimen and glucose readings in consideration of recent steroid usage. Please ensure patient has neurology followup.  2.  Labs / imaging needed at time of  follow-up: BMP to evaluate electrolytes/glucose/anion gap  3.  Pending labs/ test needing follow-up: None  Follow-up Appointments: Follow-up Information    Marco Collie, MD Follow up.   Specialty: Family Medicine Why: Please call for an appointment Contact information: [address] [place] [address] [phone]        Britt Bottom, MD. Go on 11/28/2019.   Specialty: Neurology Why: Please go to your appointment on Monday 11/28/19 at 3:30 pm Contact information: [place] [address] [place] Hospital Course by problem list:  # Multiple sclerosis exacerbation: Patient presented on 11/22/19 for 4-day history of worsening weakness, numbness, and vision changes. Patient was recently hospitalized from 11/06/19 to 11/11/19 at which time she received a new diagnosis of MS. Patient was given a 5 day course of solumedrol with improvement in symptoms. During this encounter, patient received MR brain/orbits which revealed multiple enhancing plaques, overall decreased compared to 11/07/19 scan, but with new/enhancing plaque in superficial left pons. Neurology consulted and patient received a 3 day course of Solu-Medrol with very mild improvement.  Patient was discharged with outpatient neurology follow-up scheduled for Monday, 11/28/2019.  # T1DM with hyperglycemia: Patient home regimen of 80 units long-acting at bedtime + Humalog 23 units 3 times daily with meals.  Glucose was elevated at 391 on presentation with mildly elevated anion gap, no ketones on urinalysis.  Patient was managed on regimen of Levemir 40 units twice daily + aspart 23 units 3 times daily with meals + sliding scale insulin.  Patient was discharged with instructions to take 40 units twice daily of long acting insulin instead of 80 units at bedtime.  Patient stated she would call endocrinologist on day of discharge for further instructions.  Discharge Vitals:   BP 133/85  (BP Location: Left Arm)   Pulse 96   Temp 97.8 F (36.6 C) (Oral)   Resp 18   Ht 5\' 4"  (1.626 m)   Wt 115 kg   SpO2 100%   BMI 43.52 kg/m   Pertinent Labs, Studies, and Procedures:  CBC Latest Ref Rng & Units 11/24/2019 11/23/2019 11/21/2019  WBC 4.0 - 10.5 K/uL 17.8(H) 24.7(H) 13.8(H)  Hemoglobin 12.0 - 15.0 g/dL 01/21/2020 94.7 65.4  Hematocrit 36.0 - 46.0 % 38.0 41.4 43.3  Platelets 150 - 400 K/uL 312 301 348   BMP Latest Ref Rng & Units 11/24/2019 11/23/2019 11/21/2019  Glucose 70 - 99 mg/dL 01/21/2020) 354(S) 568(L)  BUN 6 - 20 mg/dL 14 17 13   Creatinine 0.44 - 1.00 mg/dL 275(T 7.00  Sodium 135 - 145 mmol/L 139 136 137  Potassium 3.5 - 5.1 mmol/L 4.1 3.9 4.2  Chloride 98 - 111 mmol/L 107 104 101  CO2 22 - 32 mmol/L 21(L) 18(L) 20(L)  Calcium 8.9 - 10.3 mg/dL 1.74) 9.1 9.0   MR Orbits W WO Contrast (11/22/19): MR Brain W WO Contrast (11/22/19): IMPRESSION: 1. Demyelinating/multiple sclerosis pattern with multiple enhancing plaques. There has been an overall decrease in plaque enhancement when compared to 11/07/2019, although there is a new and enhancing plaque seen in the superficial left pons. 2. Negative for optic neuritis.   Discharge Instructions: Discharge Instructions    Call MD for:  difficulty breathing, headache or visual disturbances   Complete by: As directed    Call MD for:  persistant dizziness or light-headedness   Complete by: As directed    Diet - low sodium heart healthy   Complete by: As directed    Discharge instructions   Complete by: As directed    You were seen in the hospital for worsening of your multiple sclerosis. You were given steroids for 3 days. Please followup with your neurologist on Monday. If your symptoms worsen, please return to the hospital. We recommend you to split your long-acting insulin (toujeo) into two doses - 40 mg in the morning and 40 mg in the evening. Please talk with your endocrinologist for further recommendations  Thank you for  allowing 11/09/2019 to be part of your medical care!   Increase activity slowly   Complete by: As directed       Signed: Wednesday, MD 11/24/2019, 9:48 AM

## 2019-11-24 NOTE — Consult Note (Signed)
   [facility] CM Inpatient Consult   11/24/2019  Danielle Baker 10/08/1995 [id number]     Patient is currently active with pending status [facility] Care Management for chronic disease/care management services. Patient briefly reviewed discharge summary progress notes today and assessed for readmission with less than 30 days and to assess for post hospital follow up needs.   Patient has been engaged by a [facility] Care Coordinator for the [facility] plan. Our community based plan of care has focused on disease management and community resource support for post hospital follow up.   Patient will receive a post hospital call and will be evaluated for assessments and disease process education.     Plan: Patient will be followed by [facility] RN Care Coordinator.   For additional questions or referrals please contact:   Charlesetta Shanks, RN BSN CCM [facility]  [phone] business mobile phone Toll free office [phone]  Fax number: [phone] [email or web address]

## 2019-11-25 ENCOUNTER — Other Ambulatory Visit: Payer: Self-pay | Admitting: *Deleted

## 2019-11-25 NOTE — Patient Outreach (Signed)
[facility]) Care Management  11/25/2019  Danielle Baker 22-Dec-1995 [id number]    Transition of care telephone call  Referral received: 11/25/2019 Initial outreach: 11/25/2019  Initial unsuccessful telephone call to patient's preferred number in order to complete transition of care assessment; no answer, left HIPAA compliant voicemail message requesting return call.   Plan: Will report to covering RN Ander Purpura) to follow up accordingly next week with this pt.  Elliot Cousin, RN Care Management Coordinator [facility] Main Office [phone]

## 2019-11-28 ENCOUNTER — Telehealth: Payer: Self-pay | Admitting: *Deleted

## 2019-11-28 ENCOUNTER — Encounter: Payer: Self-pay | Admitting: Neurology

## 2019-11-28 ENCOUNTER — Other Ambulatory Visit: Payer: Self-pay

## 2019-11-28 ENCOUNTER — Ambulatory Visit: Payer: No Typology Code available for payment source | Admitting: Neurology

## 2019-11-28 VITALS — BP 125/81 | HR 108 | Temp 97.1°F | Ht 64.0 in | Wt 256.4 lb

## 2019-11-28 DIAGNOSIS — G35 Multiple sclerosis: Secondary | ICD-10-CM | POA: Diagnosis not present

## 2019-11-28 DIAGNOSIS — R200 Anesthesia of skin: Secondary | ICD-10-CM

## 2019-11-28 DIAGNOSIS — Z79899 Other long term (current) drug therapy: Secondary | ICD-10-CM | POA: Diagnosis not present

## 2019-11-28 DIAGNOSIS — E109 Type 1 diabetes mellitus without complications: Secondary | ICD-10-CM | POA: Diagnosis not present

## 2019-11-28 DIAGNOSIS — R269 Unspecified abnormalities of gait and mobility: Secondary | ICD-10-CM

## 2019-11-28 DIAGNOSIS — E559 Vitamin D deficiency, unspecified: Secondary | ICD-10-CM

## 2019-11-28 DIAGNOSIS — G35D Multiple sclerosis, unspecified: Secondary | ICD-10-CM

## 2019-11-28 DIAGNOSIS — R3915 Urgency of urination: Secondary | ICD-10-CM

## 2019-11-28 MED ORDER — BUSPIRONE HCL 15 MG PO TABS: 15.0000 mg | ORAL_TABLET | Freq: Two times a day (BID) | ORAL | 1 refills | Status: DC

## 2019-11-28 MED FILL — busPIRone HCL 15 MG TABS: 15 | 30 days supply | Qty: 60 | Fill #0

## 2019-11-28 NOTE — Telephone Encounter (Signed)
Placed JCV lab in [facility] lock box for routine lab pick up. Results pending. 

## 2019-11-28 NOTE — Progress Notes (Addendum)
[facility]  PATIENT: Danielle Baker DOB: March 05, 1996  REFERRING DOCTOR OR PCP:   Marco Collie, MD (PCP) SOURCE: Patient, notes from both hospital admissions, imaging and laboratory reports, MRI images personally reviewed.  _________________________________   HISTORICAL  CHIEF COMPLAINT:  Chief Complaint  Patient presents with  . New Patient (Initial Visit)    RM 13, alone. Was at [facility] 11/21/19-11/24/19 for weakness/numbness/vision changes. Was also at hospital 11/06/19-11/11/19-received 5 days IV steroids.   . Multiple Sclerosis    Fellt IV steroids helped but once she stopped them, she started feeling worse again. This past weekend, she has had severe back pain and right hip numbness/thigh numbness. She feels more unsteady while walking. No family hx of MS per pt. She is still having vision changes, left eye blurriness. She wears glasses.     HISTORY OF PRESENT ILLNESS:  I had the pleasure seeing your patient, Danielle Baker, at the [place] [facility] at [facility] for neurologic consultation regarding her recent diagnosis of relapsing remitting multiple sclerosis.  She is a 24 year old woman who had the rapid onset of  left arm and flank numbness on 11/07/2019, and to a lesser extent in the right arm.  In retrospect she had some dysesthetic sensation in her trunk 2 weeks earlier.    She went to [facility] and was admitted for IV Solumedrol.   Two weeks later, she had left visual changes  and more numbness bilaterally but more on her right side and also had left facial numbness.   She denied   She has a Lhermitte sign into the right leg with the second episode last week.    She received 3 days IV Solu-Medrol and noted improvement but feels the Lhermitte sign and numbness are worse since then.     She does not recall any neurologic symptoms over the past few years before the recent 2 episodes.   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    Currently, she feels her gait is reduced with left > right leg clumsiness.  She feels generally weak and also notes left > right hand weakness.   She has numbness in both hands, left > right and in both legs.   She has urinary urgency but no incontinence.    Her left eye is blurry but colors are symmetric.   She has vertigo.     She notes fatigue that intensified the last month.    She is anxious but denies depression.   She is on Lexapro.    She is sleeping worse due to sleep onset insomnia.   She is taking naps many night.   She does not snore.     She notes cognition is slower and feels 'foggy headed'.     She had her [employer] in [place].  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     She has no FH of MS.    Data reviewed: I personally reviewed the MRI images from 11/07/2019 and 11/22/2019: MRI of the cervical and thoracic spine 11/07/2019 showed enhancing foci at C3C4 posteriorly to the left and T1 and non-enhancing foci centrally at C1-C2, T2 centrally and T3T4 towards the right.   Brain MRI showed scattered foci in the MCP's, left cerebellar hemisphere, pons, left thalamus and periventricular and juxtacortical white matter.   Several foci in  the hemispheres and in the left middle cerebellar peduncle enhanced after contrast.    MRI of the brain and orbits 11/22/2019 showed an additional enhancing lesion in the left posterior lateral pons.    Orbits were normal.    Labs showed negative HIV and NMO Ab.   Vit D was 11 (now being supplemented).   REVIEW OF SYSTEMS: Constitutional: No fevers, chills, sweats, or change in appetite.  She has fatigue. Eyes: No visual changes, double vision, eye pain Ear, nose and throat: No hearing loss, ear pain, nasal congestion, sore throat Cardiovascular: No chest pain, palpitations Respiratory: No shortness of breath at rest or with exertion.   No wheezes GastrointestinaI: No  nausea, vomiting, diarrhea, abdominal pain, fecal incontinence Genitourinary:  Urinary urgency and frequency. Musculoskeletal: No neck pain, back pain Integumentary: No rash, pruritus, skin lesions Neurological: as above Psychiatric: Anxiety and mild depression. Endocrine: She is a type I diabetic. Hematologic/Lymphatic: No anemia, purpura, petechiae. Allergic/Immunologic: No itchy/runny eyes, nasal congestion, recent allergic reactions, rashes  ALLERGIES: Allergies  Allergen Reactions  . Enalapril Other (See Comments)    "Weak and blacked out" couldn't stand up. Pt states it dropped her BP too low   . Morphine And Related Rash    HOME MEDICATIONS:  Current Outpatient Medications:  .  acetaminophen (TYLENOL) 325 MG tablet, Take 650 mg by mouth every 6 (six) hours as needed for mild pain., Disp: , Rfl:  .  dimenhyDRINATE (DRAMAMINE) 50 MG tablet, Take 50 mg by mouth every 8 (eight) hours as needed for nausea or dizziness., Disp: , Rfl:  .  escitalopram (LEXAPRO) 20 MG tablet, Take 20 mg by mouth daily., Disp: , Rfl:  .  HUMALOG KWIKPEN 100 UNIT/ML KwikPen, Inject 23 Units into the skin in the morning, at noon, and at bedtime., Disp: , Rfl:  .  ibuprofen (ADVIL) 200 MG tablet, Take 400 mg by mouth every 6 (six) hours as needed for headache or moderate pain., Disp: , Rfl:  .  TOUJEO MAX SOLOSTAR 300 UNIT/ML Solostar Pen, Inject 40 Units into the skin 2 (two) times daily., Disp: 3 mL, Rfl: 0 .  Vitamin D, Ergocalciferol, (DRISDOL) 1.25 MG (50000 UNIT) CAPS capsule, Take 1 capsule (50,000 Units total) by mouth every 7 (seven) days., Disp: 4 capsule, Rfl: 0 .  busPIRone (BUSPAR) 15 MG tablet, Take 1 tablet (15 mg total) by mouth 2 (two) times daily., Disp: 180 tablet, Rfl: 1  PAST MEDICAL HISTORY: Past Medical History:  Diagnosis Date  . Heart murmur    as a child  . Hypoglycemia associated with diabetes (HCC)   . Obesity, morbid (HCC)   . Pancreatitis   . Tachycardia   . Type 1  diabetes mellitus not at goal [facility])    since age of 24 years old    PAST SURGICAL HISTORY: Past Surgical History:  Procedure Laterality Date  . CESAREAN SECTION N/A 03/17/2016   Procedure: CESAREAN SECTION;  Surgeon: Marlow Baars, MD;  Location: [facility] BIRTHING SUITES;  Service: Obstetrics;  Laterality: N/A;  REQUEST RNFA  . CESAREAN SECTION N/A 08/06/2018   Procedure: CESAREAN SECTION;  Surgeon: Marlow Baars, MD;  Location: [facility] BIRTHING SUITES;  Service: Obstetrics;  Laterality: N/A;  RNFA AVAILABLE  . CHOLECYSTECTOMY      FAMILY HISTORY: Family History  Problem Relation Age of Onset  . Thyroid disease Mother   . Obesity Mother   . Diabetes Father   . Obesity Father   . Obesity Sister   . Cancer  Maternal Grandmother   . Obesity Paternal Grandfather     SOCIAL HISTORY:  Social History   Socioeconomic History  . Marital status: Married    Spouse name: Not on file  . Number of children: Not on file  . Years of education: Not on file  . Highest education level: Not on file  Occupational History  . Not on file  Tobacco Use  . Smoking status: Never Smoker  . Smokeless tobacco: Never Used  Substance and Sexual Activity  . Alcohol use: No    Alcohol/week: 0.0 standard drinks  . Drug use: No  . Sexual activity: Yes    Birth control/protection: None  Other Topics Concern  . Not on file  Social History Narrative   Right handed   Caffeine use: none   Newly married   [employer] - online   Works part time at a daycare   Works at a [employer] time   Social Determinants of [employer]:   . Difficulty of Paying Living Expenses:   Food Insecurity:   . Worried About Programme researcher, broadcasting/film/video in the Last Year:   . Barista in the Last Year:   Transportation Needs:   . Freight forwarder (Medical):   Marland Kitchen Lack of Transportation (Non-Medical):   Physical Activity:   . Days of Exercise per Week:   . Minutes of Exercise per Session:     Stress:   . Feeling of Stress :   Social Connections:   . Frequency of Communication with Friends and Family:   . Frequency of Social Gatherings with Friends and Family:   . Attends Religious Services:   . Active Member of Clubs or Organizations:   . Attends Banker Meetings:   Marland Kitchen Marital Status:   Intimate Partner Violence:   . Fear of Current or Ex-Partner:   . Emotionally Abused:   Marland Kitchen Physically Abused:   . Sexually Abused:      PHYSICAL EXAM  Vitals:   11/28/19 1532  BP: 125/81  Pulse: (!) 108  Temp: (!) 97.1 F (36.2 C)  Weight: 256 lb 6.4 oz (116.3 kg)  Height: 5\' 4"  (1.626 m)    Body mass index is 44.01 kg/m.   Hearing Screening   125Hz  250Hz  500Hz  1000Hz  2000Hz  3000Hz  4000Hz  6000Hz  8000Hz   Right ear:           Left ear:             Visual Acuity Screening   Right eye Left eye Both eyes  Without correction:     With correction: 20/30 20/100 20/50     General: The patient is well-developed and well-nourished and in no acute distress  HEENT:  Head is [place]/AT.  Sclera are anicteric.  Funduscopic exam shows normal optic discs and retinal vessels.  Neck: No carotid bruits are noted.  The neck is nontender.  Cardiovascular: The heart has a regular rate and rhythm with a normal S1 and S2. There were no murmurs, gallops or rubs.    Skin: Extremities are without rash or  edema.  Musculoskeletal:  Back is nontender  Neurologic Exam  Mental status: The patient is alert and oriented x 3 at the time of the examination. The patient has apparent normal recent and remote memory, with an apparently normal attention span and concentration ability.   Speech is normal.  Cranial nerves: Extraocular movements are full. Pupils are equal, round, and reactive  to light and accomodation.  VA as above.   Colors are symmetric.   Facial symmetry is present. There is reduced left  facial sensation to soft touch and temperature.   She reported the tuning fork vibration  was less left frontal vs. Right. .Facial strength is normal.  Trapezius and sternocleidomastoid strength is normal. No dysarthria is noted.  The tongue is midline, and the patient has symmetric elevation of the soft palate. No obvious hearing deficits are noted.  Motor:  Muscle bulk is normal.   Tone is normal. Strength is  5 / 5 proximally but reduced RAM left hand > right and mild reduced grip intrinsic hand muscles, left > right.   Sensory: Sensory testing shows reduced touch/temp (nearly absent) left arm and reduced right arm and left worse than right flank.  More symmetric in legs.    Reduced vibration sensation, almost absent left arm and reduced right arm.   Mildly reduced in right leg and absent on left.   Coordination: Cerebellar testing reveals good finger-nose-finger right and slightly reduced left and slightly reduced left heel-to-shin bilaterally.  Gait and station: Station is normal.   Gait is mildly wide and tandem is wide.l. Romberg is borderline positive.   Reflexes: Deep tendon reflexes are symmetric and normal bilaterally.   Plantar responses are flexor.    DIAGNOSTIC DATA (LABS, IMAGING, TESTING) - I reviewed patient records, labs, notes, testing and imaging myself where available.  Lab Results  Component Value Date   WBC 17.8 (H) 11/24/2019   HGB 12.0 11/24/2019   HCT 38.0 11/24/2019   MCV 85.8 11/24/2019   PLT 312 11/24/2019      Component Value Date/Time   NA 139 11/24/2019 0332   K 4.1 11/24/2019 0332   CL 107 11/24/2019 0332   CO2 21 (L) 11/24/2019 0332   GLUCOSE 161 (H) 11/24/2019 0332   BUN 14 11/24/2019 0332   CREATININE 0.62 11/24/2019 0332   CREATININE 0.62 03/22/2015 1210   CALCIUM 8.6 (L) 11/24/2019 0332   PROT 6.3 (L) 11/23/2019 0358   ALBUMIN 3.2 (L) 11/23/2019 0358   AST 73 (H) 11/23/2019 0358   ALT 58 (H) 11/23/2019 0358   ALKPHOS 88 11/23/2019 0358   BILITOT 0.5 11/23/2019 0358   GFRNONAA >60 11/24/2019 0332   GFRNONAA >89 03/22/2015  1210   GFRAA >60 11/24/2019 0332   GFRAA >89 03/22/2015 1210   Lab Results  Component Value Date   CHOL 160 03/22/2015   HDL 35.30 (L) 03/22/2015   LDLCALC 95 03/22/2015   TRIG 151.0 (H) 03/22/2015   CHOLHDL 5 03/22/2015   Lab Results  Component Value Date   HGBA1C 9.4 (H) 11/07/2019   Lab Results  Component Value Date   VITAMINB12 229 11/07/2019   Lab Results  Component Value Date   TSH 2.716 11/07/2019       ASSESSMENT AND PLAN  Multiple sclerosis (HCC) - Plan: Hepatitis B core antibody, total, Hepatitis B surface antigen, QuantiFERON-TB Gold Plus, Hepatitis B surface antibody,qualitative, Stratify JCV Antibody Test ([facility]), Varicella zoster antibody, IgG  High risk medication use - Plan: Hepatitis B core antibody, total, Hepatitis B surface antigen, QuantiFERON-TB Gold Plus, Hepatitis B surface antibody,qualitative, Stratify JCV Antibody Test ([facility]), Varicella zoster antibody, IgG  Type 1 diabetes mellitus without complication (HCC)  Numbness  Gait disturbance  Urinary urgency   In summary, Danielle Baker is a 24 year old woman who is presenting with an aggressive relapsing remitting multiple sclerosis with 5 spinal cord lesions, two  that enhanced, and multiple brain lesions, at least 6 enhancing.  I discussed with her that given the aggressiveness of her initial presentation with MS that we need to place her on one of the more effective disease modifying therapies.  I discussed, in detail, the risks and benefits of Tysabri and Ocrevus.  We will check lab work to see if she is a candidate for 1 of both of these medications.  I will call her after we get the results back so we can decide about therapy.  I will write her out of work until June 1 due to her reduced balance and difficulties with hand coordination.  We did discuss that improvement from exacerbation typically takes weeks to months so that I would expect her to have significant recovery.  She also has anxiety and  I will add BuSpar to her Lexapro.  She has some urinary urgency but I will hold off on the treatment as this may improve over the next few weeks.  She will continue vitamin D supplementation.  She will return to see [place] for the visit in 6 weeks but hopefully begin her intravenous therapy within a couple weeks.  We discussed that if IV access becomes an issue she may need to have a Port-A-Cath.  Thank you for asking to see Ms. Danielle Baker.  Please let me know if I can be of further assistance with her or other patients in the future.   Tywon Niday A. Epimenio Foot, MD, [facility] 11/28/2019, 6:36 PM Certified in Neurology, Clinical Neurophysiology, Sleep Medicine and Neuroimaging  [facility] [address], [address] [place], [place] [address] [phone]

## 2019-11-29 ENCOUNTER — Telehealth: Payer: Self-pay | Admitting: *Deleted

## 2019-11-29 DIAGNOSIS — Z0289 Encounter for other administrative examinations: Secondary | ICD-10-CM

## 2019-11-29 NOTE — Telephone Encounter (Signed)
Gave completed/signed Medical certification form back to medical records to process for pt. Dr. Epimenio Foot wrote her out of work from 11/06/19-01/17/20. Also provided intermittent leave starting 01/17/2020-01/16/21 (1 day/month, 1-5 days per episode).

## 2019-11-30 ENCOUNTER — Ambulatory Visit: Payer: Self-pay | Admitting: Neurology

## 2019-11-30 ENCOUNTER — Other Ambulatory Visit: Payer: Self-pay | Admitting: *Deleted

## 2019-11-30 NOTE — Patient Outreach (Signed)
[facility]) Care Management  11/30/2019  Danielle Baker July 23, 1996 [id number]   Transition of care call Referral received: 11/23/19 Initial outreach attempt: 09/27/19  Insurance: UMR    2nd unsuccessful telephone call to patient's preferred contact number in order to complete post hospital discharge transition of care assessment , no answer left HIPAA compliant message requesting return call.    Objective:  Danielle Baker  was hospitalized at [facility] from 4/5-11/24/19 for Multiple Sclerosis Exacerbation  Comorbidities include: Diabetes Type 1, A1c 9.4% on 11/07/19, Newly Diagnosed Multiple Sclerosis  She was discharged to home on 4/8/21without the need for home health services or DME.    Plan If no return call from patient will attempt 3rd outreach in the next 4 business days.    Egbert Garibaldi, RN, BSN  [facility] Care Management,Care Management Coordinator  [phone]- Mobile [phone]- Toll Free Main Office

## 2019-12-01 LAB — VARICELLA ZOSTER ANTIBODY, IGG: Varicella zoster IgG: <135 index — ABNORMAL LOW (ref >165)

## 2019-12-01 LAB — QUANTIFERON-TB GOLD PLUS
QuantiFERON Mitogen Value: >10 IU/mL
QuantiFERON Nil Value: 0.02 IU/mL
QuantiFERON TB1 Ag Value: 0.07 IU/mL
QuantiFERON TB2 Ag Value: 0.06 IU/mL
QuantiFERON-TB Gold Plus: NEGATIVE

## 2019-12-01 LAB — HEPATITIS B SURFACE ANTIGEN: Hepatitis B Surface Ag: NEGATIVE

## 2019-12-01 LAB — HEPATITIS B SURFACE ANTIBODY,QUALITATIVE: Hep B Surface Ab, Qual: REACTIVE

## 2019-12-01 LAB — HEPATITIS B CORE ANTIBODY, TOTAL: Hep B Core Total Ab: NEGATIVE

## 2019-12-05 ENCOUNTER — Other Ambulatory Visit: Payer: Self-pay | Admitting: *Deleted

## 2019-12-05 NOTE — Patient Outreach (Signed)
[facility]) Care Management  12/05/2019  Danielle Baker 12-26-1995 [id number]   Transition of care call  Referral received: 11/23/19 Initial outreach attempt: 11/25/19 Insurance: UMR   Third unsuccessful telephone call to patient's preferred contact number in order to complete post hospital discharge transition of care assessment; no answer, left HIPAA compliant message requesting return call.   Objective: Per electronic record , Danielle Baker was hospitalized [facility] from 4/5-11/24/19 for Multiple Sclerosis Exacerbation  Comorbidities include: Diabetes Type 1, A1c 9.4% on 11/07/19, Newly Diagnosed Multiple Sclerosis  She was discharged to home on 4/8/21without the need for home health servicesor DME. Plan: If no return call from patient, will close case to [facility] Care Management services in 10 business days after initial post hospital discharge outreach, on 11/25/19  Egbert Garibaldi, RN, BSN  [facility] Care Management,Care Management Coordinator  [phone]- Mobile [phone]- Toll Free Main Office

## 2019-12-06 NOTE — Telephone Encounter (Signed)
Called [facility] at [phone] account# [id number] to check on status of results. Spoke with [employer]. She states test is still pending. Should have results by end of next week.

## 2019-12-08 ENCOUNTER — Other Ambulatory Visit: Payer: Self-pay | Admitting: *Deleted

## 2019-12-08 NOTE — Telephone Encounter (Signed)
JCV Ab drawn on 11/28/2019 positive, index: 0.60. Gave to MD to review.

## 2019-12-08 NOTE — Patient Outreach (Signed)
[facility]) Care Management  12/08/2019  Danielle Baker 1995/12/05 [id number]  Transition of care /Case Closure Unsuccessful outreach    Referral received:11/23/19 Initial outreach:11/25/19 Insurance: UMR    Unable to complete post hospital discharge transition of care assessment. No return call form patient after 3 call attempts and no response to request to contact RN Care Coordinator in unsuccessful outreach letter mailed to home on 4/9 /21.  Objective: Per electronic record , Danielle Baker hospitalized [facility] from4/5-11/24/19 for Multiple Sclerosis ExacerbationComorbidities include: Diabetes Type 1, A1c 9.4% on 11/07/19, Newly Diagnosed Multiple Sclerosis Shewas discharged to home on 4/8/21without the need for home health servicesor DME. Plan Case closed to [facility] [employer] as it has been 10 days since initial post discharge outreach attempt.    Egbert Garibaldi, RN, BSN  [facility] Care Management,Care Management Coordinator  [phone]- Mobile [phone]- Toll Free Main Office

## 2019-12-12 ENCOUNTER — Telehealth: Payer: Self-pay | Admitting: *Deleted

## 2019-12-12 NOTE — Telephone Encounter (Signed)
Faxed complete/signed Ocrevus start form to [employer] at [phone]. Received fax confirmation. Gave completed start form to [facility] for them to process. Included office visit notes,signed order for [facility], labs, MRI. Sent copy of start form to be scanned to epic.   

## 2019-12-19 MED FILL — HUMALOG 100 UNITS/ML KWIKPE: 100 | 30 days supply | Qty: 27 | Fill #2

## 2019-12-19 MED FILL — ESCITALOPRAM 20 MG TABLET: 20 | 30 days supply | Qty: 30 | Fill #0

## 2019-12-22 ENCOUNTER — Telehealth: Payer: Self-pay | Admitting: *Deleted

## 2019-12-22 MED ORDER — GABAPENTIN 300 MG PO CAPS: 300.0000 mg | ORAL_CAPSULE | Freq: Three times a day (TID) | ORAL | 0 refills | Status: DC

## 2019-12-22 NOTE — Telephone Encounter (Signed)
Took call from phone staff and spoke with pt. She reports constant feeling of fire ants that are on her that are biting her. This started last night before she went to bed. She normally only got this feeling when she moved her head forward to her chest. She was unable to sleep last night because of this. She has not yet scheduled Ocrevus infusion with infusion suite. Advised I will check on this, most likely still pending approval with insurance. Advised I will also speak with MD and call back with his recommendation.  I checked with [name], RN with [facility] on status of getting pt scheduled for Ocrevus infusion. PA still pending with insurance.

## 2019-12-22 NOTE — Telephone Encounter (Signed)
Per Dr. Epimenio Foot, he recommends starting her on gabapentin 300mg  po TID #90, 5 refills to see if this will help her symptoms. I called pt and relayed message. She is agreeable to this plan. She wanted 30 days supply to go to [facility] and if this helps her sx she would like future refills to go to [facility] outpt pharmacy.

## 2019-12-26 NOTE — Telephone Encounter (Signed)
Called pt. Per Freddi Starr, RN w/ [facility], insurance requiring referral from PCP to Dr. Epimenio Foot to be on file. Pt states she requested this to be done last week. She will f/u with them and have them fax this to [place] at [phone].

## 2020-01-04 ENCOUNTER — Telehealth: Payer: Self-pay | Admitting: *Deleted

## 2020-01-04 ENCOUNTER — Ambulatory Visit: Payer: No Typology Code available for payment source | Admitting: Neurology

## 2020-01-04 ENCOUNTER — Other Ambulatory Visit: Payer: Self-pay

## 2020-01-04 ENCOUNTER — Encounter: Payer: Self-pay | Admitting: Neurology

## 2020-01-04 VITALS — BP 121/80 | HR 108 | Ht 64.0 in | Wt 259.5 lb

## 2020-01-04 DIAGNOSIS — R269 Unspecified abnormalities of gait and mobility: Secondary | ICD-10-CM

## 2020-01-04 DIAGNOSIS — R400 Somnolence: Secondary | ICD-10-CM | POA: Insufficient documentation

## 2020-01-04 DIAGNOSIS — G35 Multiple sclerosis: Secondary | ICD-10-CM

## 2020-01-04 DIAGNOSIS — E109 Type 1 diabetes mellitus without complications: Secondary | ICD-10-CM | POA: Diagnosis not present

## 2020-01-04 DIAGNOSIS — E559 Vitamin D deficiency, unspecified: Secondary | ICD-10-CM

## 2020-01-04 DIAGNOSIS — R200 Anesthesia of skin: Secondary | ICD-10-CM

## 2020-01-04 MED ORDER — PHENTERMINE HCL 37.5 MG PO CAPS: 37.5000 mg | ORAL_CAPSULE | ORAL | 5 refills | Status: DC

## 2020-01-04 MED FILL — PHENTERMINE 37.5 MG CAPSULE: 37.5 | 30 days supply | Qty: 30 | Fill #0

## 2020-01-04 NOTE — Telephone Encounter (Signed)
Called pt's PCP office Yetta Flock [facility]) at [phone]. Spoke with [facility]. Advised we have not received referral that pt requested x2. They faxed on 12/27/19 but I advised we did not receive this and asked it be resent to [phone]. Their fax: [phone] if needed.  I received referral by fax at 1500 and provided to [facility] to submit for Ocrevus approval.

## 2020-01-04 NOTE — Telephone Encounter (Signed)
Gave completed/signed Medical certification form back to medical records to process for pt. Dr. Epimenio Foot wrote her out of work from 11/06/19-02/16/20. Also provided intermittent leave starting 02/16/2020-02/15/21 (1 day/month, 1-5 days per episode).

## 2020-01-04 NOTE — Progress Notes (Signed)
[facility]  PATIENT: Danielle Baker DOB: October 21, 1995  REFERRING DOCTOR OR PCP:   Abner Greenspan, MD (PCP) SOURCE: Patient, notes from both hospital admissions, imaging and laboratory reports, MRI images personally reviewed.  _________________________________   HISTORICAL  CHIEF COMPLAINT:  Chief Complaint  Patient presents with  . Follow-up    RM 12, alone. Last seen 11/28/2019.   . Multiple Sclerosis    Trying to get initiated on Ocrevus still per infusion suite. They have not received referral from PCP office yet.   . Burning pain    Takes gabapentin    HISTORY OF PRESENT ILLNESS:    Since the last visit, she has noted some improvement of the numbness in the right hand but it is starting to come back again.   The left sided numbness is unchanged.   Gait is doing about the same.  She has stumbles but no falls.   She notes intermittent weakness.   She notes the Lhermitte sign is worse and seems to linger more.    She has a sensation like ants biting her skin.   Gabapentin has helped the dysesthetric pain.   She notes dizziness when she is hot or everexerted.    We prescribed Ocrevus but we are waiting for a referral to get her scheduled  She is noting a lot of sleepiness and also feels in a mild  She works as a Lawyer at [employer].   Due to her numbness and other symptoms, this has been more difficult.  We discussed extending her leave a month so she can get both infusions in.,    Vit D was low and she is taking 5000 U daily (was on 50000 weekly)   I had the pleasure seeing your patient, Danielle Baker, at the MS center at [facility] for neurologic consultation regarding her recent diagnosis of relapsing remitting multiple sclerosis.  She is a 24 year old woman who had the rapid onset of  left arm and flank numbness on 11/07/2019, and to a lesser extent in the right arm.  In retrospect she had some dysesthetic sensation in her trunk 2 weeks earlier.     She went to [facility] and was admitted for IV Solumedrol.   Two weeks later, she had left visual changes  and more numbness bilaterally but more on her right side and also had left facial numbness.   She denied   She has a Lhermitte sign into the right leg with the second episode last week.    She received 3 days IV Solu-Medrol and noted improvement but feels the Lhermitte sign and numbness are worse since then.     She does not recall any neurologic symptoms over the past few years before the recent 2 episodes.   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    Currently, she feels her gait is reduced with left > right leg clumsiness.  She feels generally weak and also notes left > right hand weakness.   She has numbness in both hands, left > right and in both legs.   She has urinary urgency but no incontinence.    Her left eye is blurry but colors are symmetric.   She has vertigo.     She notes fatigue that intensified the last month.    She is anxious but denies depression.   She is on Lexapro.  She is sleeping worse due to sleep onset insomnia.   She is taking naps many night.   She does not snore.     She notes cognition is slower and feels 'foggy headed'.     She had her [employer] in [place].  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     She has no FH of MS.    Data reviewed: I personally reviewed the MRI images from 11/07/2019 and 11/22/2019: MRI of the cervical and thoracic spine 11/07/2019 showed enhancing foci at C3C4 posteriorly to the left and T1 and non-enhancing foci centrally at C1-C2, T2 centrally and T3T4 towards the right.   Brain MRI showed scattered foci in the MCP's, left cerebellar hemisphere, pons, left thalamus and periventricular and juxtacortical white matter.   Several foci in the hemispheres and in the left middle cerebellar peduncle enhanced after contrast.    MRI of  the brain and orbits 11/22/2019 showed an additional enhancing lesion in the left posterior lateral pons.    Orbits were normal.    Labs showed negative HIV and NMO Ab.   Vit D was 11 (now being supplemented).   REVIEW OF SYSTEMS: Constitutional: No fevers, chills, sweats, or change in appetite.  She has fatigue. Eyes: No visual changes, double vision, eye pain Ear, nose and throat: No hearing loss, ear pain, nasal congestion, sore throat Cardiovascular: No chest pain, palpitations Respiratory: No shortness of breath at rest or with exertion.   No wheezes GastrointestinaI: No nausea, vomiting, diarrhea, abdominal pain, fecal incontinence Genitourinary:  Urinary urgency and frequency. Musculoskeletal: No neck pain, back pain Integumentary: No rash, pruritus, skin lesions Neurological: as above Psychiatric: Anxiety and mild depression. Endocrine: She is a type I diabetic. Hematologic/Lymphatic: No anemia, purpura, petechiae. Allergic/Immunologic: No itchy/runny eyes, nasal congestion, recent allergic reactions, rashes  ALLERGIES: Allergies  Allergen Reactions  . Enalapril Other (See Comments)    "Weak and blacked out" couldn't stand up. Pt states it dropped her BP too low   . Morphine And Related Rash    HOME MEDICATIONS:  Current Outpatient Medications:  .  acetaminophen (TYLENOL) 325 MG tablet, Take 650 mg by mouth every 6 (six) hours as needed for mild pain., Disp: , Rfl:  .  busPIRone (BUSPAR) 15 MG tablet, Take 1 tablet (15 mg total) by mouth 2 (two) times daily., Disp: 180 tablet, Rfl: 1 .  dimenhyDRINATE (DRAMAMINE) 50 MG tablet, Take 50 mg by mouth every 8 (eight) hours as needed for nausea or dizziness., Disp: , Rfl:  .  escitalopram (LEXAPRO) 20 MG tablet, Take 20 mg by mouth daily., Disp: , Rfl:  .  gabapentin (NEURONTIN) 300 MG capsule, Take 1 capsule (300 mg total) by mouth 3 (three) times daily., Disp: 90 capsule, Rfl: 0 .  HUMALOG KWIKPEN 100 UNIT/ML KwikPen,  Inject 23 Units into the skin in the morning, at noon, and at bedtime., Disp: , Rfl:  .  ibuprofen (ADVIL) 200 MG tablet, Take 400 mg by mouth every 6 (six) hours as needed for headache or moderate pain., Disp: , Rfl:  .  ocrelizumab (OCREVUS) 300 MG/10ML injection, Inject into the vein once., Disp: , Rfl:  .  TOUJEO MAX SOLOSTAR 300 UNIT/ML Solostar Pen, Inject 40 Units into the skin 2 (two) times daily., Disp: 3 mL, Rfl: 0 .  phentermine 37.5 MG capsule, Take 1 capsule (37.5 mg total) by mouth every morning., Disp: 30 capsule, Rfl: 5  PAST  MEDICAL HISTORY: Past Medical History:  Diagnosis Date  . Heart murmur    as a child  . Hypoglycemia associated with diabetes (HCC)   . Obesity, morbid (HCC)   . Pancreatitis   . Tachycardia   . Type 1 diabetes mellitus not at goal [facility])    since age of 24 years old    PAST SURGICAL HISTORY: Past Surgical History:  Procedure Laterality Date  . CESAREAN SECTION N/A 03/17/2016   Procedure: CESAREAN SECTION;  Surgeon: Marlow Baars, MD;  Location: [facility] BIRTHING SUITES;  Service: Obstetrics;  Laterality: N/A;  REQUEST RNFA  . CESAREAN SECTION N/A 08/06/2018   Procedure: CESAREAN SECTION;  Surgeon: Marlow Baars, MD;  Location: [facility] BIRTHING SUITES;  Service: Obstetrics;  Laterality: N/A;  RNFA AVAILABLE  . CHOLECYSTECTOMY      FAMILY HISTORY: Family History  Problem Relation Age of Onset  . Thyroid disease Mother   . Obesity Mother   . Diabetes Father   . Obesity Father   . Obesity Sister   . Cancer Maternal Grandmother   . Obesity Paternal Grandfather     SOCIAL HISTORY:  Social History   Socioeconomic History  . Marital status: Married    Spouse name: Not on file  . Number of children: Not on file  . Years of education: Not on file  . Highest education level: Not on file  Occupational History  . Not on file  Tobacco Use  . Smoking status: Never Smoker  . Smokeless tobacco: Never Used  Substance and Sexual Activity  . Alcohol use:  No    Alcohol/week: 0.0 standard drinks  . Drug use: No  . Sexual activity: Yes    Birth control/protection: None  Other Topics Concern  . Not on file  Social History Narrative   Right handed   Caffeine use: none   Newly married   [employer] - online   Works part time at a daycare   Works at a [employer] time   Social Determinants of [employer]:   . Difficulty of Paying Living Expenses:   Food Insecurity:   . Worried About Programme researcher, broadcasting/film/video in the Last Year:   . Barista in the Last Year:   Transportation Needs:   . Freight forwarder (Medical):   Marland Kitchen Lack of Transportation (Non-Medical):   Physical Activity:   . Days of Exercise per Week:   . Minutes of Exercise per Session:   Stress:   . Feeling of Stress :   Social Connections:   . Frequency of Communication with Friends and Family:   . Frequency of Social Gatherings with Friends and Family:   . Attends Religious Services:   . Active Member of Clubs or Organizations:   . Attends Banker Meetings:   Marland Kitchen Marital Status:   Intimate Partner Violence:   . Fear of Current or Ex-Partner:   . Emotionally Abused:   Marland Kitchen Physically Abused:   . Sexually Abused:      PHYSICAL EXAM  Vitals:   01/04/20 1257 01/04/20 1300  BP: 120/80 121/80  Pulse:  (!) 108  SpO2:  98%  Weight:  259 lb 8 oz (117.7 kg)  Height: 5\' 4"  (1.626 m) 5\' 4"  (1.626 m)    Body mass index is 44.54 kg/m.  No exam data present   General: The patient is well-developed and well-nourished and in no acute distress  HEENT:  Head is [place]/AT.  Sclera are anicteric.     Skin: Extremities are without rash or  edema.   Neurologic Exam  Mental status: The patient is alert and oriented x 3 at the time of the examination. The patient has apparent normal recent and remote memory, with an apparently normal attention span and concentration ability.   Speech is normal.  Cranial nerves: Extraocular  movements are full.   Facial symmetry is present. There is reduced left  facial sensation to soft touch and temperature.   She reported the tuning fork vibration was less left frontal vs. Right. .Facial strength is normal.  Trapezius and sternocleidomastoid strength is normal. No dysarthria is noted.   No obvious hearing deficits are noted.  Motor:  Muscle bulk is normal.   Tone is normal. Strength is  5 / 5 proximally but reduced RAM left hand > right and mild reduced grip intrinsic hand muscles, left > right.   Sensory: Sensory testing shows reduced touch/temp  Sensation in the left armand both legs (L>R)    Reduced vibration sensation in both arms and legs, left worse than right.   Coordination: Cerebellar testing reveals good finger-nose-finger right and slightly reduced left and slightly reduced left heel-to-shin bilaterally.  Gait and station: Station is normal.   Gait is mildly wide and tandem is wide. Romberg is borderline positive.   Reflexes: Deep tendon reflexes are symmetric and normal bilaterally.        DIAGNOSTIC DATA (LABS, IMAGING, TESTING) - I reviewed patient records, labs, notes, testing and imaging myself where available.  Lab Results  Component Value Date   WBC 17.8 (H) 11/24/2019   HGB 12.0 11/24/2019   HCT 38.0 11/24/2019   MCV 85.8 11/24/2019   PLT 312 11/24/2019      Component Value Date/Time   NA 139 11/24/2019 0332   K 4.1 11/24/2019 0332   CL 107 11/24/2019 0332   CO2 21 (L) 11/24/2019 0332   GLUCOSE 161 (H) 11/24/2019 0332   BUN 14 11/24/2019 0332   CREATININE 0.62 11/24/2019 0332   CREATININE 0.62 03/22/2015 1210   CALCIUM 8.6 (L) 11/24/2019 0332   PROT 6.3 (L) 11/23/2019 0358   ALBUMIN 3.2 (L) 11/23/2019 0358   AST 73 (H) 11/23/2019 0358   ALT 58 (H) 11/23/2019 0358   ALKPHOS 88 11/23/2019 0358   BILITOT 0.5 11/23/2019 0358   GFRNONAA >60 11/24/2019 0332   GFRNONAA >89 03/22/2015 1210   GFRAA >60 11/24/2019 0332   GFRAA >89 03/22/2015 1210    Lab Results  Component Value Date   CHOL 160 03/22/2015   HDL 35.30 (L) 03/22/2015   LDLCALC 95 03/22/2015   TRIG 151.0 (H) 03/22/2015   CHOLHDL 5 03/22/2015   Lab Results  Component Value Date   HGBA1C 9.4 (H) 11/07/2019   Lab Results  Component Value Date   VITAMINB12 229 11/07/2019   Lab Results  Component Value Date   TSH 2.716 11/07/2019       ASSESSMENT AND PLAN  Multiple sclerosis (HCC)  Type 1 diabetes mellitus without complication (HCC)  Numbness  Gait disturbance  Sleepiness  Vitamin D deficiency  Obesity, morbid (HCC)  1.   We will get her scheduled for Ocrevus as soon as we get the authorization. 2.   Extend leave of absence to July 1  3.   Phentermine for weight loss, ADD and sleepiness/fatigue 4.   Stay active 5.   rtc 4 months, sooner if new or worsening neurologic function  Fintan Grater A.  Felecia Shelling, MD, [facility] 09/09/4495, 5:30 PM Certified in Neurology, Clinical Neurophysiology, Sleep Medicine and Neuroimaging  [facility] [address], [place], [place] [address] [phone]

## 2020-01-09 MED FILL — busPIRone HCL 15 MG TABS: 15 | 30 days supply | Qty: 60 | Fill #1

## 2020-01-17 MED FILL — HUMALOG 100 UNITS/ML KWIKPE: 100 | 30 days supply | Qty: 27 | Fill #3

## 2020-01-17 MED FILL — ESCITALOPRAM 20 MG TABLET: 20 | 30 days supply | Qty: 30 | Fill #1

## 2020-01-23 ENCOUNTER — Telehealth: Payer: Self-pay | Admitting: *Deleted

## 2020-01-23 NOTE — Telephone Encounter (Signed)
Called pt back. Advised I discuss with Dr. Anne Hahn because Dr. Epimenio Foot is currently out of the office. He recommends either oral or IV steroids. Infusion suite said she can come this afternoon or tomorrow afternoon for IV steroids. Pt would like to come for IV steroids. Gave completed/signed orders to intrafusion.

## 2020-01-23 NOTE — Telephone Encounter (Signed)
Patient called and stated she has an infusion in 3 weeks however her symptoms have worsened in the last 2 days. Pt reports they are almost as bad as when this all started. Her vertigo has worsened to the point where she was throwing up last night. She said her R hand is back to being completely numb and the L side of her face is numb again. She would like to know if there's anything that can be done until her infusion.

## 2020-01-24 ENCOUNTER — Other Ambulatory Visit: Payer: Self-pay | Admitting: Neurology

## 2020-01-24 MED ORDER — PREDNISONE 10 MG PO TABS: ORAL_TABLET | ORAL | 0 refills | Status: DC

## 2020-01-24 NOTE — Progress Notes (Signed)
This patient received 1000 mg of IV Solu-Medrol on 23 January 2020, this resulted in significant increases in her blood sugar.  No steroids will be given today, she will go on a lower dose 6-day course of prednisone.

## 2020-02-04 ENCOUNTER — Ambulatory Visit (INDEPENDENT_AMBULATORY_CARE_PROVIDER_SITE_OTHER): Payer: No Typology Code available for payment source

## 2020-02-04 ENCOUNTER — Ambulatory Visit (HOSPITAL_COMMUNITY)
Admission: EM | Admit: 2020-02-04 | Discharge: 2020-02-04 | Disposition: A | Discharge status: Home / Self Care | Payer: No Typology Code available for payment source | Attending: Physician Assistant | Admitting: Physician Assistant

## 2020-02-04 ENCOUNTER — Other Ambulatory Visit: Payer: Self-pay

## 2020-02-04 ENCOUNTER — Encounter (HOSPITAL_COMMUNITY): Payer: Self-pay

## 2020-02-04 DIAGNOSIS — S92354A Nondisplaced fracture of fifth metatarsal bone, right foot, initial encounter for closed fracture: Secondary | ICD-10-CM

## 2020-02-04 DIAGNOSIS — S9031XA Contusion of right foot, initial encounter: Secondary | ICD-10-CM

## 2020-02-04 DIAGNOSIS — M79671 Pain in right foot: Secondary | ICD-10-CM

## 2020-02-04 DIAGNOSIS — M79672 Pain in left foot: Secondary | ICD-10-CM

## 2020-02-04 DIAGNOSIS — S93509A Unspecified sprain of unspecified toe(s), initial encounter: Secondary | ICD-10-CM | POA: Diagnosis not present

## 2020-02-04 DIAGNOSIS — S90935A Unspecified superficial injury of left lesser toe(s), initial encounter: Secondary | ICD-10-CM | POA: Diagnosis not present

## 2020-02-04 NOTE — ED Triage Notes (Signed)
Pt reports she fell 3 hrs ago, going downstairs. Pt complaining pain in the outer part of the right foot and left middle toe pain.

## 2020-02-04 NOTE — ED Provider Notes (Signed)
[facility]    CSN: [id number] Arrival date & time: 02/04/20  1456      History   Chief Complaint Chief Complaint  Patient presents with  . Foot Pain  . Fall    HPI Danielle Baker is a 24 y.o. female.   Patient with history of MS presents for evaluation of right foot pain and left toe pain after falling down stairs earlier today.  She reports her legs gave out when going down a set of stairs.  She reports she fell to her knees and down the stairs.  She reports immediate pain in her right foot and some pain in one of her left toes.  She reports she was able to walk after that.  Denies hitting her head or other injury.  No knee pain.  Denies headache, blurry vision or dizziness.  She reports she has been able to walk however the right foot hurts a lot.  She does report that at baseline she has diminished sensation in her lower extremities from her MS.  She is due for follow-up for this soon.     Past Medical History:  Diagnosis Date  . Heart murmur    as a child  . Hypoglycemia associated with diabetes (HCC)   . Obesity, morbid (HCC)   . Pancreatitis   . Tachycardia   . Type 1 diabetes mellitus not at goal [facility])    since age of 24 years old    Patient Active Problem List   Diagnosis Date Noted  . Sleepiness 01/04/2020  . High risk medication use 11/28/2019  . Numbness 11/28/2019  . Gait disturbance 11/28/2019  . Urinary urgency 11/28/2019  . Vitamin D deficiency 11/28/2019  . Multiple sclerosis (HCC) 11/07/2019  . History of cesarean delivery 08/06/2018  . Diabetes mellitus complicating pregnancy in third trimester, antepartum 03/18/2016  . DKA (diabetic ketoacidoses) (HCC) 08/31/2013  . Hypoglycemia associated with diabetes (HCC)   . DM autonomic neuropathy (HCC)   . 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 Goiter   . Obesity, morbid (HCC)   . Dyspepsia   . Acanthosis nigricans, acquired   . Tachycardia   . Hypertension  associated with diabetes (HCC)   . Type 1 diabetes mellitus without complication (HCC) 12/16/2010  . Goiter, unspecified 12/16/2010  . Obesity 12/16/2010    Past Surgical History:  Procedure Laterality Date  . CESAREAN SECTION N/A 03/17/2016   Procedure: CESAREAN SECTION;  Surgeon: Marlow Baars, MD;  [facility];  Service: Obstetrics;  Laterality: N/A;  REQUEST RNFA  . CESAREAN SECTION N/A 08/06/2018   Procedure: CESAREAN SECTION;  Surgeon: Marlow Baars, MD;  Location: [facility] BIRTHING SUITES;  Service: Obstetrics;  Laterality: N/A;  RNFA AVAILABLE  . CHOLECYSTECTOMY      OB History    Gravida  2   Para  2   Term  2   Preterm      AB      Living  2     SAB      TAB      Ectopic      Multiple  0   Live Births  2            Home Medications    Prior to Admission medications   Medication Sig Start Date End Date Taking? Authorizing Provider  acetaminophen (TYLENOL) 325 MG tablet Take 650 mg by mouth every 6 (six) hours as needed for mild pain.  [provider]  busPIRone (BUSPAR) 15 MG tablet Take 1 tablet (15 mg total) by mouth 2 (two) times daily. 11/28/19   Sater, Pearletha Furl, MD  dimenhyDRINATE (DRAMAMINE) 50 MG tablet Take 50 mg by mouth every 8 (eight) hours as needed for nausea or dizziness.    [provider]  escitalopram (LEXAPRO) 20 MG tablet Take 20 mg by mouth daily. 10/11/19   [provider]  gabapentin (NEURONTIN) 300 MG capsule Take 1 capsule (300 mg total) by mouth 3 (three) times daily. 12/22/19   Sater, Pearletha Furl, MD  HUMALOG KWIKPEN 100 UNIT/ML KwikPen Inject 23 Units into the skin in the morning, at noon, and at bedtime. 10/06/19   [provider]  ibuprofen (ADVIL) 200 MG tablet Take 400 mg by mouth every 6 (six) hours as needed for headache or moderate pain.    [provider]  ocrelizumab (OCREVUS) 300 MG/10ML injection Inject into the vein once.    [provider]  phentermine  37.5 MG capsule Take 1 capsule (37.5 mg total) by mouth every morning. 01/04/20   Sater, Pearletha Furl, MD  predniSONE (DELTASONE) 10 MG tablet Begin taking 6 tablets daily, taper by one tablet daily until off the medication. 01/24/20   York Spaniel, MD  TOUJEO MAX SOLOSTAR 300 UNIT/ML Solostar Pen Inject 40 Units into the skin 2 (two) times daily. 11/24/19   Katherine Roan, MD    Family History Family History  Problem Relation Age of Onset  . Thyroid disease Mother   . Obesity Mother   . Diabetes Father   . Obesity Father   . Obesity Sister   . Cancer Maternal Grandmother   . Obesity Paternal Grandfather     Social History Social History   Tobacco Use  . Smoking status: Never Smoker  . Smokeless tobacco: Never Used  Vaping Use  . Vaping Use: Never used  Substance Use Topics  . Alcohol use: No    Alcohol/week: 0.0 standard drinks  . Drug use: No     Allergies   Enalapril and Morphine and related   Review of Systems Review of Systems   Physical Exam Triage Vital Signs ED Triage Vitals  Enc Vitals Group     BP 02/04/20 1525 122/86     Pulse Rate 02/04/20 1525 (!) 109     Resp 02/04/20 1525 20     Temp 02/04/20 1525 98.9 F (37.2 C)     Temp Source 02/04/20 1525 Oral     SpO2 02/04/20 1525 97 %     Weight --      Height --      Head Circumference --      Peak Flow --      Pain Score 02/04/20 1523 8     Pain Loc --      Pain Edu? --      Excl. in GC? --    No data found.  Updated Vital Signs BP 122/86 (BP Location: Right Arm)   Pulse (!) 109   Temp 98.9 F (37.2 C) (Oral)   Resp 20   LMP 01/16/2020 (Exact Date)   SpO2 97%   Visual Acuity Right Eye Distance:   Left Eye Distance:   Bilateral Distance:    Right Eye Near:   Left Eye Near:    Bilateral Near:     Physical Exam Vitals and nursing note reviewed.  HENT:     Head: Normocephalic and atraumatic.     Nose:  Nose normal.     Mouth/Throat:     Mouth: Mucous membranes are moist.   Cardiovascular:     Rate and Rhythm: Tachycardia present.  Pulmonary:     Effort: Pulmonary effort is normal. No respiratory distress.  Musculoskeletal:     Cervical back: Normal range of motion. No tenderness.     Comments: Right foot: Swelling and ecchymosis over the fifth metatarsal.  There is tenderness in this area as well.  Remainder the foot largely without swelling or ecchymosis or tenderness.  Patient was unable to bear weight in clinic.    No ankle tenderness.  No knee tenderness.  Left foot: There is some ecchymosis of the third toe.  Some tenderness.  Patient able to move toes all.  Able to bear weight on this foot.  There is some tenderness over the MTP joint of the third and fourth toes.  Neurological:     Mental Status: She is alert and oriented to person, place, and time.      UC Treatments / Results  Labs (all labs ordered are listed, but only abnormal results are displayed) Labs Reviewed - No data to display  EKG   Radiology DG Foot Complete Left  Result Date: 02/04/2020 CLINICAL DATA:  Foot pain secondary to a fall down stairs today. Pain and bruising around the third toe and third metatarsal. EXAM: LEFT FOOT - COMPLETE 3+ VIEW COMPARISON:  None. FINDINGS: There is no evidence of fracture or dislocation. There is no evidence of arthropathy or other focal bone abnormality. Soft tissues are unremarkable. IMPRESSION: Normal exam. Electronically Signed   By: Lorriane Shire M.D.   On: 02/04/2020 16:04   DG Foot Complete Right  Result Date: 02/04/2020 CLINICAL DATA:  Pain and bruising after falling down stairs today. EXAM: RIGHT FOOT COMPLETE - 3+ VIEW COMPARISON:  None. FINDINGS: There is a comminuted fracture of the distal shaft of the fifth metatarsal. No angulation significant displacement. Bones and joints of the remainder of the right foot appear normal. IMPRESSION: Comminuted fracture of the distal shaft of the fifth metatarsal. Electronically Signed   By: Lorriane Shire M.D.   On: 02/04/2020 15:59    Procedures Procedures (including critical care time)  Medications Ordered in UC Medications - No data to display  Initial Impression / Assessment and Plan / UC Course  I have reviewed the triage vital signs and the nursing notes.  Pertinent labs & imaging results that were available during my care of the patient were reviewed by me and considered in my medical decision making (see chart for details).     #Comminuted fracture of the right fifth metatarsal #Left toe sprain Patient is a 24 year old with history of multiple sclerosis presenting with a comminuted fracture of the right fifth metatarsal.  Left foot x-ray without bony injury.  Will place patient in a cam walker and crutches to be nonweightbearing for follow-up with orthopedics.  Patient is able to bear weight on the left foot without much issue.  Discussed use of regular strength ibuprofen for pain management.  Patient verbalizes agreement and understanding the plan. Final Clinical Impressions(s) / UC Diagnoses   Final diagnoses:  Closed nondisplaced fracture of fifth metatarsal bone of right foot, initial encounter  Sprain of toe, initial encounter     Discharge Instructions     Wear the boot at all times, use crutches until ortho follow up  Take 2 regular strength ibuprofen as needed for pain  ED Prescriptions    None     PDMP not reviewed this encounter.   Hermelinda Medicus, PA-C 02/04/20 1920

## 2020-02-04 NOTE — Discharge Instructions (Addendum)
Wear the boot at all times, use crutches until ortho follow up  Take 2 regular strength ibuprofen as needed for pain

## 2020-02-06 ENCOUNTER — Ambulatory Visit (INDEPENDENT_AMBULATORY_CARE_PROVIDER_SITE_OTHER): Payer: No Typology Code available for payment source | Admitting: Podiatry

## 2020-02-06 ENCOUNTER — Other Ambulatory Visit: Payer: Self-pay

## 2020-02-06 ENCOUNTER — Ambulatory Visit (INDEPENDENT_AMBULATORY_CARE_PROVIDER_SITE_OTHER): Payer: No Typology Code available for payment source

## 2020-02-06 DIAGNOSIS — S90122A Contusion of left lesser toe(s) without damage to nail, initial encounter: Secondary | ICD-10-CM

## 2020-02-06 DIAGNOSIS — G35 Multiple sclerosis: Secondary | ICD-10-CM | POA: Diagnosis not present

## 2020-02-06 DIAGNOSIS — M79671 Pain in right foot: Secondary | ICD-10-CM

## 2020-02-06 DIAGNOSIS — S92351A Displaced fracture of fifth metatarsal bone, right foot, initial encounter for closed fracture: Secondary | ICD-10-CM

## 2020-02-06 DIAGNOSIS — E1065 Type 1 diabetes mellitus with hyperglycemia: Secondary | ICD-10-CM | POA: Diagnosis not present

## 2020-02-06 DIAGNOSIS — M722 Plantar fascial fibromatosis: Secondary | ICD-10-CM

## 2020-02-06 DIAGNOSIS — IMO0002 Reserved for concepts with insufficient information to code with codable children: Secondary | ICD-10-CM

## 2020-02-06 MED FILL — PHENTERMINE HCL 37.5 MG CAP: 37.5 | 30 days supply | Qty: 30 | Fill #1

## 2020-02-06 MED FILL — busPIRone HCL 15 MG TABS: 15 | 30 days supply | Qty: 60 | Fill #2

## 2020-02-07 ENCOUNTER — Telehealth: Payer: Self-pay | Admitting: Neurology

## 2020-02-07 NOTE — Telephone Encounter (Signed)
Stanton Kidney- can you please watch for this? Thank you!

## 2020-02-07 NOTE — Telephone Encounter (Signed)
Pt has called to report that she is already on a leave from work.  Pt is asking for a call from Emma,RN to discuss her request for a continuance of being out of work.

## 2020-02-07 NOTE — Telephone Encounter (Signed)
No problem.

## 2020-02-07 NOTE — Telephone Encounter (Signed)
Called pt back to get further information. This past Saturday she was going downstairs and her legs went numb. She fell down the stairs and broke foot/toe on right side. Urgent care doctor recommended she contact Dr. Epimenio Foot about extending LOA until mid-August since it was r/t MS. She has seen a podiatrist but they did not discuss paperwork. She currently was written out until 02/16/20 by Dr. Epimenio Foot. Her second Ocrevus infusion is 02/28/20.  Per Dr. Epimenio Foot, he is okay to fill our form as requested. She verbalized understanding. She will bring it by today or tomorrow before 5pm.

## 2020-02-09 NOTE — Telephone Encounter (Signed)
Gave completed/signed form back to [facility] Kidney in medical records to process for pt.  Dr. Epimenio Foot wrote pt out of work from 02/16/2020-04/02/2020. Also provided intermittent leave for MS 02/16/2020-02/15/2021 (1 episode per month, 1-5 days per episode).

## 2020-02-10 ENCOUNTER — Encounter: Payer: Self-pay | Admitting: Podiatry

## 2020-02-16 MED ORDER — ONDANSETRON 4 MG PO TBDP: 4.0000 mg | ORAL_TABLET | Freq: Three times a day (TID) | ORAL | 6 refills | Status: DC | PRN

## 2020-02-16 NOTE — Telephone Encounter (Signed)
Chart reviewed, 24 years old was seen by Dr. Purvis Sheffield for multiple sclerosis, most recent visit on May/19/2021, MRI showed brain and cervical cord involvement,  She was recently treated with steroid IV Solu-Medrol 1000 units on June 7, significant increase of glucose, followed by 6 days course of p.o. prednisone tapering,  Now complains of nausea diarrhea, following her initial ocrelizumab infusion (February 15, 2020?), second infusion is scheduled on February 28, 2020   I have called in Zofran 4 mg as needed, may try over-the-counter omeprazole,  Check on her again early next week

## 2020-02-19 NOTE — Progress Notes (Signed)
  Subjective:  Patient ID: Royetta Asal, female    DOB: [birth year],  MRN: [id number]  Chief Complaint  Patient presents with  . Foot Injury    injury to Rt dorsal and Lt 3rd toe x Sat; going down the stairs fell down; 6/10 constatn pain -went to urgent care had xrs and was dx with a fx on 5th met and sprain at Lt 3rd toe -w/ swellign and burising Tx: icing and boot  . Diabetes    FBS: 124 a1C: 9.4 PCP: Nyra Capes x 1-2 mo    24 y.o. female presents with the above complaint. History confirmed with patient.   Objective:  Physical Exam: warm, good capillary refill, no trophic changes or ulcerative lesions, normal DP and PT pulses and normal sensory exam. Left Foot: Pain to palpation about the third toe met Right Foot: Pain bruising contusion along the lateral aspect of the foot with maximal pain at the fifth metatarsal lesser so at the fourth metatarsal  No images are attached to the encounter.  Radiographs: X-ray of the right foot: Fifth metatarsal shaft fracture Assessment:   1. Closed displaced fracture of fifth metatarsal bone of right foot, initial encounter   2. Contusion of third toe of left foot, initial encounter   3. Diabetes mellitus type 1, uncontrolled, insulin dependent ([place])   4. Multiple sclerosis ([place])    Plan:  Patient was evaluated and treated and all questions answered.  Fracture Right 5th Metatarsal -XR Reviewed with patient,  -Would benefit from trial of non-operative management for this injury., -Soft cast applied and  -CAM boot dispensed  Contusion left third toe -Buddy splinted educated on proper taping  No follow-ups on file.

## 2020-02-21 ENCOUNTER — Encounter (HOSPITAL_COMMUNITY): Payer: Self-pay | Admitting: Emergency Medicine

## 2020-02-21 ENCOUNTER — Telehealth: Payer: Self-pay | Admitting: Neurology

## 2020-02-21 ENCOUNTER — Other Ambulatory Visit: Payer: Self-pay

## 2020-02-21 ENCOUNTER — Encounter: Payer: Self-pay | Admitting: Podiatry

## 2020-02-21 ENCOUNTER — Inpatient Hospital Stay (HOSPITAL_COMMUNITY)
Admission: EM | Admit: 2020-02-21 | Discharge: 2020-02-23 | DRG: 060 | Disposition: A | Discharge status: Home / Self Care | Payer: No Typology Code available for payment source | Attending: Internal Medicine | Admitting: Internal Medicine

## 2020-02-21 ENCOUNTER — Ambulatory Visit (INDEPENDENT_AMBULATORY_CARE_PROVIDER_SITE_OTHER): Payer: No Typology Code available for payment source

## 2020-02-21 ENCOUNTER — Ambulatory Visit (INDEPENDENT_AMBULATORY_CARE_PROVIDER_SITE_OTHER): Payer: No Typology Code available for payment source | Admitting: Podiatry

## 2020-02-21 DIAGNOSIS — Z833 Family history of diabetes mellitus: Secondary | ICD-10-CM

## 2020-02-21 DIAGNOSIS — S90122A Contusion of left lesser toe(s) without damage to nail, initial encounter: Secondary | ICD-10-CM

## 2020-02-21 DIAGNOSIS — S92351A Displaced fracture of fifth metatarsal bone, right foot, initial encounter for closed fracture: Secondary | ICD-10-CM | POA: Diagnosis present

## 2020-02-21 DIAGNOSIS — K219 Gastro-esophageal reflux disease without esophagitis: Secondary | ICD-10-CM | POA: Diagnosis present

## 2020-02-21 DIAGNOSIS — E1065 Type 1 diabetes mellitus with hyperglycemia: Secondary | ICD-10-CM | POA: Diagnosis present

## 2020-02-21 DIAGNOSIS — Z8349 Family history of other endocrine, nutritional and metabolic diseases: Secondary | ICD-10-CM

## 2020-02-21 DIAGNOSIS — Z20822 Contact with and (suspected) exposure to covid-19: Secondary | ICD-10-CM | POA: Diagnosis present

## 2020-02-21 DIAGNOSIS — M79671 Pain in right foot: Secondary | ICD-10-CM

## 2020-02-21 DIAGNOSIS — E8889 Other specified metabolic disorders: Secondary | ICD-10-CM

## 2020-02-21 DIAGNOSIS — G35 Multiple sclerosis: Secondary | ICD-10-CM | POA: Diagnosis present

## 2020-02-21 DIAGNOSIS — S90122D Contusion of left lesser toe(s) without damage to nail, subsequent encounter: Secondary | ICD-10-CM

## 2020-02-21 DIAGNOSIS — W109XXA Fall (on) (from) unspecified stairs and steps, initial encounter: Secondary | ICD-10-CM | POA: Diagnosis present

## 2020-02-21 DIAGNOSIS — S92351D Displaced fracture of fifth metatarsal bone, right foot, subsequent encounter for fracture with routine healing: Secondary | ICD-10-CM | POA: Diagnosis not present

## 2020-02-21 DIAGNOSIS — IMO0002 Reserved for concepts with insufficient information to code with codable children: Secondary | ICD-10-CM | POA: Diagnosis present

## 2020-02-21 DIAGNOSIS — Z794 Long term (current) use of insulin: Secondary | ICD-10-CM

## 2020-02-21 DIAGNOSIS — E109 Type 1 diabetes mellitus without complications: Secondary | ICD-10-CM

## 2020-02-21 HISTORY — DX: Multiple sclerosis, unspecified: G35.D

## 2020-02-21 HISTORY — DX: Multiple sclerosis: G35

## 2020-02-21 LAB — URINALYSIS, ROUTINE W REFLEX MICROSCOPIC
Bacteria, UA: NONE SEEN
Bilirubin Urine: NEGATIVE
Glucose, UA: >=500 mg/dL — AB
Hgb urine dipstick: NEGATIVE
Ketones, ur: NEGATIVE mg/dL
Nitrite: NEGATIVE
Protein, ur: NEGATIVE mg/dL
Specific Gravity, Urine: 1.01 (ref 1.005–1.030)
pH: 7 (ref 5.0–8.0)

## 2020-02-21 LAB — CBC
HCT: 42.4 % (ref 36.0–46.0)
Hemoglobin: 13.2 g/dL (ref 12.0–15.0)
MCH: 27.3 pg (ref 26.0–34.0)
MCHC: 31.1 g/dL (ref 30.0–36.0)
MCV: 87.8 fL (ref 80.0–100.0)
Platelets: 387 10*3/uL (ref 150–400)
RBC: 4.83 MIL/uL (ref 3.87–5.11)
RDW: 14.6 % (ref 11.5–15.5)
WBC: 13.8 10*3/uL — ABNORMAL HIGH (ref 4.0–10.5)
nRBC: 0 % (ref 0.0–0.2)

## 2020-02-21 LAB — SARS CORONAVIRUS 2 BY RT PCR (HOSPITAL ORDER, PERFORMED IN ~~LOC~~ HOSPITAL LAB): SARS Coronavirus 2: NEGATIVE

## 2020-02-21 LAB — BASIC METABOLIC PANEL
Anion gap: 12 (ref 5–15)
BUN: 11 mg/dL (ref 6–20)
CO2: 21 mmol/L — ABNORMAL LOW (ref 22–32)
Calcium: 8.7 mg/dL — ABNORMAL LOW (ref 8.9–10.3)
Chloride: 107 mmol/L (ref 98–111)
Creatinine, Ser: 0.75 mg/dL (ref 0.44–1.00)
GFR calc Af Amer: >60 mL/min (ref >60)
GFR calc non Af Amer: >60 mL/min (ref >60)
Glucose, Bld: 143 mg/dL — ABNORMAL HIGH (ref 70–99)
Potassium: 4 mmol/L (ref 3.5–5.1)
Sodium: 140 mmol/L (ref 135–145)

## 2020-02-21 LAB — HEMOGLOBIN A1C
Hgb A1c MFr Bld: 10 % — ABNORMAL HIGH (ref 4.8–5.6)
Mean Plasma Glucose: 240.3 mg/dL

## 2020-02-21 LAB — I-STAT BETA HCG BLOOD, ED (MC, WL, AP ONLY): I-stat hCG, quantitative: <5 m[IU]/mL (ref <5)

## 2020-02-21 LAB — CBG MONITORING, ED
Glucose-Capillary: 241 mg/dL — ABNORMAL HIGH (ref 70–99)
Glucose-Capillary: 195 mg/dL — ABNORMAL HIGH (ref 70–99)

## 2020-02-21 MED ORDER — ACETAMINOPHEN 500 MG PO TABS
500.0000 mg | ORAL_TABLET | ORAL | Status: DC | PRN
  Administered 2020-02-22 (×2): 500 mg via ORAL
  Filled 2020-02-21 (×2): qty 1

## 2020-02-21 MED ORDER — ONDANSETRON HCL 4 MG PO TABS: 4.0000 mg | ORAL_TABLET | Freq: Four times a day (QID) | ORAL | Status: DC | PRN

## 2020-02-21 MED ORDER — SODIUM CHLORIDE 0.9 % IV SOLN
500.0000 mg | Freq: Once | INTRAVENOUS | Status: AC
  Administered 2020-02-22: 500 mg via INTRAVENOUS
  Filled 2020-02-21: qty 4

## 2020-02-21 MED ORDER — SODIUM CHLORIDE 0.9 % IV SOLN: INTRAVENOUS | Status: DC

## 2020-02-21 MED ORDER — INSULIN GLARGINE 100 UNIT/ML ~~LOC~~ SOLN
40.0000 [IU] | Freq: Two times a day (BID) | SUBCUTANEOUS | Status: DC
  Administered 2020-02-21: 40 [IU] via SUBCUTANEOUS
  Filled 2020-02-21 (×4): qty 0.4

## 2020-02-21 MED ORDER — INSULIN ASPART 100 UNIT/ML ~~LOC~~ SOLN
23.0000 [IU] | Freq: Three times a day (TID) | SUBCUTANEOUS | Status: DC
  Administered 2020-02-21: 23 [IU] via SUBCUTANEOUS

## 2020-02-21 MED ORDER — SODIUM CHLORIDE 0.9 % IV SOLN
500.0000 mg | Freq: Once | INTRAVENOUS | Status: AC
  Administered 2020-02-21: 500 mg via INTRAVENOUS
  Filled 2020-02-21: qty 4

## 2020-02-21 MED ORDER — ENOXAPARIN SODIUM 40 MG/0.4ML ~~LOC~~ SOLN
40.0000 mg | SUBCUTANEOUS | Status: DC
  Administered 2020-02-21 – 2020-02-22 (×2): 40 mg via SUBCUTANEOUS
  Filled 2020-02-21 (×2): qty 0.4

## 2020-02-21 MED ORDER — SODIUM CHLORIDE 0.9 % IV SOLN
1000.0000 mg | INTRAVENOUS | Status: AC
  Administered 2020-02-22 – 2020-02-23 (×2): 1000 mg via INTRAVENOUS
  Filled 2020-02-21 (×2): qty 8

## 2020-02-21 MED ORDER — INSULIN LISPRO (1 UNIT DIAL) 100 UNIT/ML (KWIKPEN): 23.0000 [IU] | PEN_INJECTOR | Freq: Three times a day (TID) | SUBCUTANEOUS | Status: DC

## 2020-02-21 MED ORDER — ESCITALOPRAM OXALATE 10 MG PO TABS
20.0000 mg | ORAL_TABLET | Freq: Every day | ORAL | Status: DC
  Administered 2020-02-21 – 2020-02-23 (×3): 20 mg via ORAL
  Filled 2020-02-21 (×3): qty 2

## 2020-02-21 MED ORDER — IBUPROFEN 400 MG PO TABS: 400.0000 mg | ORAL_TABLET | Freq: Four times a day (QID) | ORAL | Status: DC | PRN

## 2020-02-21 MED ORDER — SENNOSIDES-DOCUSATE SODIUM 8.6-50 MG PO TABS: 1.0000 | ORAL_TABLET | Freq: Every evening | ORAL | Status: DC | PRN

## 2020-02-21 MED ORDER — ONDANSETRON HCL 4 MG/2ML IJ SOLN: 4.0000 mg | Freq: Four times a day (QID) | INTRAMUSCULAR | Status: DC | PRN

## 2020-02-21 MED ORDER — SODIUM CHLORIDE 0.9 % IV SOLN: 1000.0000 mg | INTRAVENOUS | Status: DC

## 2020-02-21 MED ORDER — PANTOPRAZOLE SODIUM 40 MG IV SOLR
40.0000 mg | INTRAVENOUS | Status: DC
  Administered 2020-02-21 – 2020-02-22 (×2): 40 mg via INTRAVENOUS
  Filled 2020-02-21 (×2): qty 40

## 2020-02-21 MED ORDER — BUSPIRONE HCL 10 MG PO TABS
15.0000 mg | ORAL_TABLET | Freq: Two times a day (BID) | ORAL | Status: DC
  Administered 2020-02-21 – 2020-02-23 (×4): 15 mg via ORAL
  Filled 2020-02-21 (×4): qty 2

## 2020-02-21 MED ORDER — INSULIN ASPART 100 UNIT/ML ~~LOC~~ SOLN: 0.0000 [IU] | Freq: Three times a day (TID) | SUBCUTANEOUS | Status: DC

## 2020-02-21 NOTE — Telephone Encounter (Signed)
I spoke to Dr. Amada Jupiter of [facility].  I would like to admit her to [facility] for 3 days of IV Solu-Medrol (1000 mg each dose) additionally a UTI could be ruled out.  I spoke to the emergency room triage nurse and gave the information.  I called Ms. Bellefeuille back to let her know to go to the [facility] emergency room.  She will be admitted from there.

## 2020-02-21 NOTE — Telephone Encounter (Addendum)
Called patient back. She reports that since she received Ocrevus 300mg  on 02/14/20 (she is scheduled for second infusion of 300mg  on 02/28/20),  her face/legs are numb, has pain associated with this. She feels very weak. This past Saturday, she had urinary incontinence. Has had constant vertigo that is making her nauseous. Weakness/numbness has been progressing over the weekend. Denies any signs of infection (UTI or respiratory). Zofran helps with nausea but comes back. She wants to know what Dr. 03/01/20 recommends at this point. Advised I will discuss with him and call her back.

## 2020-02-21 NOTE — Progress Notes (Signed)
  Subjective:  Patient ID: Danielle Baker, female    DOB: 1996/03/28,  MRN: [id number]  Chief Complaint  Patient presents with  . Foot Problem    the right foot is better and not as bruised as it was and the boot helps    24 y.o. female presents with the above complaint. History confirmed with patient.   Objective:  Physical Exam: warm, good capillary refill, no trophic changes or ulcerative lesions, normal DP and PT pulses and normal sensory exam. Right Foot: Pain at right 5th met, bruising 2nd/3rd MPJs No images are attached to the encounter.  Radiographs: X-ray of the right foot: Fifth metatarsal shaft fracture with interval healing Assessment:   1. Contusion of third toe of left foot, subsequent encounter   2. Closed displaced fracture of fifth metatarsal bone of right foot with routine healing, subsequent encounter    Plan:  Patient was evaluated and treated and all questions answered.  Fracture Right 5th Metatarsal -XR Reviewed with patient, interval healing noted -CAM boot dispensed today -F/u in 4 weeks for repeat XRs.  Return in about 4 weeks (around 03/20/2020) for Fracture f/u with XRs right 5th metatarsal.

## 2020-02-21 NOTE — ED Provider Notes (Signed)
[facility] EMERGENCY DEPARTMENT Provider Note   CSN: [id number] Arrival date & time: 02/21/20  1413     History Chief Complaint  Patient presents with  . MS Flare up    Danielle Baker is a 24 y.o. female.  HPI   Patient presents to the ED for admission to the hospital for an MS flareup.  Patient states she has history of MS.  She had an injection of her MS medication last week.  Patient started noticing symptoms over the weekend which she had numbness to her bilateral arms and legs as well as some dizziness and a headache.  She feels that her grips are weaker than usual.  She also has some difficulty with walking.  She feels like her legs are weaker than usual.  Patient talked to her neurologist.  He felt that she was having an MS flare and wanted her to be admitted to the hospital for high-dose IV steroids.  Patient does have a history of diabetes and previously had issues with hyperglycemia.  Past Medical History:  Diagnosis Date  . Heart murmur    as a child  . Hypoglycemia associated with diabetes (HCC)   . Multiple sclerosis (HCC)   . Obesity, morbid (HCC)   . Pancreatitis   . Tachycardia   . Type 1 diabetes mellitus not at goal [facility])    since age of 24 years old    Patient Active Problem List   Diagnosis Date Noted  . Sleepiness 01/04/2020  . High risk medication use 11/28/2019  . Numbness 11/28/2019  . Gait disturbance 11/28/2019  . Urinary urgency 11/28/2019  . Vitamin D deficiency 11/28/2019  . Multiple sclerosis (HCC) 11/07/2019  . History of cesarean delivery 08/06/2018  . Palpitations 03/30/2018  . Pregnant 01/14/2018  . Maternal varicella, non-immune 01/04/2018  . Pancreatitis 05/21/2016  . Diabetes mellitus complicating pregnancy in third trimester, antepartum 03/18/2016  . DKA (diabetic ketoacidoses) (HCC) 08/31/2013  . Diabetes mellitus type 1, uncontrolled, insulin dependent (HCC) 07/08/2012  . Childhood obesity 07/08/2012  .  Hypoglycemia associated with diabetes (HCC)   . DM autonomic neuropathy (HCC)   . 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 Goiter   . Obesity, morbid (HCC)   . Dyspepsia   . Acanthosis nigricans, acquired   . Tachycardia   . Hypertension associated with diabetes (HCC)   . Type 1 diabetes mellitus without complication (HCC) 12/16/2010  . Goiter, unspecified 12/16/2010  . Obesity 12/16/2010    Past Surgical History:  Procedure Laterality Date  . CESAREAN SECTION N/A 03/17/2016   Procedure: CESAREAN SECTION;  Surgeon: Marlow Baars, MD;  Location: [facility] BIRTHING SUITES;  Service: Obstetrics;  Laterality: N/A;  REQUEST RNFA  . CESAREAN SECTION N/A 08/06/2018   Procedure: CESAREAN SECTION;  Surgeon: Marlow Baars, MD;  Location: [facility] BIRTHING SUITES;  Service: Obstetrics;  Laterality: N/A;  RNFA AVAILABLE  . CHOLECYSTECTOMY       OB History    Gravida  2   Para  2   Term  2   Preterm      AB      Living  2     SAB      TAB      Ectopic      Multiple  0   Live Births  2           Family History  Problem Relation Age of Onset  . Thyroid disease Mother   .  Obesity Mother   . Diabetes Father   . Obesity Father   . Obesity Sister   . Cancer Maternal Grandmother   . Obesity Paternal Grandfather     Social History   Tobacco Use  . Smoking status: Never Smoker  . Smokeless tobacco: Never Used  Vaping Use  . Vaping Use: Never used  Substance Use Topics  . Alcohol use: No    Alcohol/week: 0.0 standard drinks  . Drug use: No    Home Medications Prior to Admission medications   Medication Sig Start Date End Date Taking? Authorizing Provider  acetaminophen (TYLENOL) 325 MG tablet Take 650 mg by mouth every 6 (six) hours as needed for mild pain.    [provider]  busPIRone (BUSPAR) 15 MG tablet Take 1 tablet (15 mg total) by mouth 2 (two) times daily. 11/28/19   Sater, Pearletha Furl, MD  dimenhyDRINATE (DRAMAMINE) 50 MG tablet  Take 50 mg by mouth every 8 (eight) hours as needed for nausea or dizziness.    [provider]  escitalopram (LEXAPRO) 20 MG tablet Take 20 mg by mouth daily. 10/11/19   [provider]  gabapentin (NEURONTIN) 300 MG capsule Take 1 capsule (300 mg total) by mouth 3 (three) times daily. 12/22/19   Sater, Pearletha Furl, MD  HUMALOG KWIKPEN 100 UNIT/ML KwikPen Inject 23 Units into the skin in the morning, at noon, and at bedtime. 10/06/19   [provider]  ibuprofen (ADVIL) 200 MG tablet Take 400 mg by mouth every 6 (six) hours as needed for headache or moderate pain.    [provider]  ocrelizumab (OCREVUS) 300 MG/10ML injection Inject into the vein once.    [provider]  ondansetron (ZOFRAN ODT) 4 MG disintegrating tablet Take 1 tablet (4 mg total) by mouth every 8 (eight) hours as needed. 02/16/20   Levert Feinstein, MD  phentermine 37.5 MG capsule Take 1 capsule (37.5 mg total) by mouth every morning. 01/04/20   Sater, Pearletha Furl, MD  predniSONE (DELTASONE) 10 MG tablet Begin taking 6 tablets daily, taper by one tablet daily until off the medication. 01/24/20   York Spaniel, MD  TOUJEO MAX SOLOSTAR 300 UNIT/ML Solostar Pen Inject 40 Units into the skin 2 (two) times daily. 11/24/19   Katherine Roan, MD    Allergies    Enalapril and Morphine and related  Review of Systems   Review of Systems  All other systems reviewed and are negative.   Physical Exam Updated Vital Signs BP 107/70   Pulse (!) 106   Temp 98.6 F (37 C) (Oral)   Resp (!) 22   SpO2 98%   Physical Exam Vitals and nursing note reviewed.  Constitutional:      General: She is not in acute distress.    Appearance: She is well-developed.  HENT:     Head: Normocephalic and atraumatic.     Right Ear: External ear normal.     Left Ear: External ear normal.  Eyes:     General: No scleral icterus.       Right eye: No discharge.        Left eye: No discharge.     Conjunctiva/sclera:  Conjunctivae normal.  Neck:     Trachea: No tracheal deviation.  Cardiovascular:     Rate and Rhythm: Normal rate and regular rhythm.  Pulmonary:     Effort: Pulmonary effort is normal. No respiratory distress.     Breath sounds: Normal breath sounds. No  stridor. No wheezing or rales.  Abdominal:     General: Bowel sounds are normal. There is no distension.     Palpations: Abdomen is soft.     Tenderness: There is no abdominal tenderness. There is no guarding or rebound.  Musculoskeletal:        General: No tenderness.     Cervical back: Neck supple.     Comments: Brace right lower extremity  Skin:    General: Skin is warm and dry.     Findings: No rash.  Neurological:     Mental Status: She is alert.     Cranial Nerves: No cranial nerve deficit (no facial droop, extraocular movements intact, no slurred speech).     Sensory: No sensory deficit.     Motor: Weakness present. No abnormal muscle tone or seizure activity.     Coordination: Coordination normal.     ED Results / Procedures / Treatments   Labs (all labs ordered are listed, but only abnormal results are displayed) Labs Reviewed  BASIC METABOLIC PANEL - Abnormal; Notable for the following components:      Result Value   CO2 21 (*)    Glucose, Bld 143 (*)    Calcium 8.7 (*)    All other components within normal limits  CBC - Abnormal; Notable for the following components:   WBC 13.8 (*)    All other components within normal limits  URINALYSIS, ROUTINE W REFLEX MICROSCOPIC - Abnormal; Notable for the following components:   Color, Urine STRAW (*)    Glucose, UA >=500 (*)    Leukocytes,Ua TRACE (*)    All other components within normal limits  CBG MONITORING, ED - Abnormal; Notable for the following components:   Glucose-Capillary 241 (*)    All other components within normal limits  SARS CORONAVIRUS 2 BY RT PCR (HOSPITAL ORDER, PERFORMED IN [facility])  I-STAT BETA HCG BLOOD, ED ([facility], [facility], [facility] ONLY)      EKG None  Radiology No results found.  Procedures Procedures (including critical care time)  Medications Ordered in ED Medications  0.9 %  sodium chloride infusion ( Intravenous New Bag/Given 02/21/20 1809)  methylPREDNISolone sodium succinate (SOLU-MEDROL) 500 mg in sodium chloride 0.9 % 50 mL IVPB (has no administration in time range)    ED Course  I have reviewed the triage vital signs and the nursing notes.  Pertinent labs & imaging results that were available during my care of the patient were reviewed by me and considered in my medical decision making (see chart for details).  Clinical Course as of Feb 20 1857  Tue Feb 21, 2020  1610 Case discussed with Dr Amada Jupiter.  Will start solumedrol 500 mg bid.  Neurology will see patient in consultation.   [JK]    Clinical Course User Index [JK] Linwood Dibbles, MD   MDM Rules/Calculators/A&P                          Patient presented to ED with symptoms concerning for an MS flare.  She was referred by her neurologist for inpatient IV high-dose steroids.  Patient's laboratory tests are reassuring.  No signs of urinary tract infection.  Patient does have diabetes so we will need to monitor her blood sugars closely.  I have spoken with neurology and they will consult on the patient.  I will consult the medical service for admission and further treatment. Final Clinical Impression(s) / ED Diagnoses  Final diagnoses:  Multiple sclerosis (HCC)  Type 1 diabetes mellitus without complication (HCC)      Linwood Dibbles, MD 02/21/20 [phone]

## 2020-02-21 NOTE — Telephone Encounter (Signed)
I spoke to Ms. Danielle Baker.  I believe she likely is having an MS relapse with a left pontine focus which could explain the combination of left facial numbness and vertigo.  I am going to have her do 3 days of IV Solu-Medrol.  She has insulin-dependent diabetes and sugars went up as high as 600 during her last IV dose of steroid.  Therefore, I will see if I can get her admitted to the hospital for the 3 days.

## 2020-02-21 NOTE — Telephone Encounter (Signed)
Pt called and stated that she is having really bad MS symptoms since the weekend and is needing to discuss with RN. Please advise.

## 2020-02-21 NOTE — Hospital Course (Addendum)
Multiple sclerosis exacerbation 24 y.o. female was admitted for weakness, numbness of arms and legs, dizziness and left facial numbness. She was recently diagnosed with MS in march 2021 and was treated with 3 days of IV Solumedrol 1 g. Patient received 3 days of IV Solumedrol per Neurology recommendation. Patient is discharged and to follow up with Neurology outpatient on July 13.   Diabetes mellitus type 1 BG 241 on arrival. A1C was 10. Patient required tight control of blood glucose due to steroid administration. She was started on 45 Lantus units BID and up to 50 units BID and sliding scale Novelog TID.   Right Fifth Metatarsal Fracture Patient had a fall on 02/04/2020 due to extremity weakness. Continue foot brace and follow up with Podiatry outpatient.

## 2020-02-21 NOTE — ED Notes (Signed)
Pt provided meal tray, pt made aware to make this RN aware when meal tray is complete to administer Insulin per protocol. Pt verbalized understanding.

## 2020-02-21 NOTE — Consult Note (Signed)
Requesting Physician: Dr. Lynelle Doctor    Chief Complaint: Bilateral arm numbness, left facial numbness  History obtained from: Patient and Chart     HPI:                                                                                                                                       Danielle MONCEAUX is a 24 y.o. female with past medical history significant for multiple sclerosis, type 1 diabetes mellitus, obesity presents to the emergency department with 2-day history of bilateral numbness in both arms as well as over the left side of her face as well as generalized weakness.  The patient had just received her first dose of Ocrevus last week and has been complaining of some generalized dizziness and headache.  However starting Saturday patient has noticed this numbness/paresthesias as well as loss of sensation to temperature in both arms as well as left side of her face.  She complains of shooting pains in both her legs when she bends her neck.  She discussed this with her outpatient neurologist who recommended her to be admitted for high-dose IV steroids.  Chart review Patient presented in March 2021 with weakness MRI of the cervical and T-spine showing enhancing foci C3-C4 as well as left T1 with the brain showing multiple scattered foci.  She had new enhancing lesion on 11/22/2019 in the left posterior lateral pons.  Low vitamin D, NMO is negative and HIV is negative.  Patient received first dose of previous last week.     Past Medical History:  Diagnosis Date  . Heart murmur    as a child  . Hypoglycemia associated with diabetes (HCC)   . Multiple sclerosis (HCC)   . Obesity, morbid (HCC)   . Pancreatitis   . Tachycardia   . Type 1 diabetes mellitus not at goal [facility])    since age of 24 years old    Past Surgical History:  Procedure Laterality Date  . CESAREAN SECTION N/A 03/17/2016   Procedure: CESAREAN SECTION;  Surgeon: Marlow Baars, MD;  Location: [facility] BIRTHING SUITES;  Service:  Obstetrics;  Laterality: N/A;  REQUEST RNFA  . CESAREAN SECTION N/A 08/06/2018   Procedure: CESAREAN SECTION;  Surgeon: Marlow Baars, MD;  Location: [facility] BIRTHING SUITES;  Service: Obstetrics;  Laterality: N/A;  RNFA AVAILABLE  . CHOLECYSTECTOMY      Family History  Problem Relation Age of Onset  . Thyroid disease Mother   . Obesity Mother   . Diabetes Father   . Obesity Father   . Obesity Sister   . Cancer Maternal Grandmother   . Obesity Paternal Grandfather    Social History:  reports that she has never smoked. She has never used smokeless tobacco. She reports that she does not drink alcohol and does not use drugs.  Allergies:  Allergies  Allergen Reactions  . Enalapril Other (See Comments)    "Weak  and blacked out" couldn't stand up. Pt states it dropped her BP too low   . Morphine And Related Rash    Medications:                                                                                                                        I reviewed home medications   ROS:                                                                                                                                     14 systems reviewed and negative except above    Examination:                                                                                                      General: Appears well-developed and well-nourished.  Psych: Affect appropriate to situation Eyes: No scleral injection HENT: No OP obstrucion Head: Normocephalic.  Cardiovascular: Normal rate and regular rhythm.  Respiratory: Effort normal and breath sounds normal to anterior ascultation GI: Soft.  No distension. There is no tenderness.  Skin: WDI    Neurological Examination Mental Status: Alert, oriented, thought content appropriate.  Speech fluent without evidence of aphasia. Able to follow 3 step commands without difficulty. Cranial Nerves: II: Visual fields grossly normal,  III,IV, VI: ptosis not present,  extra-ocular motions intact bilaterally, pupils equal, round, reactive to light and accommodation V,VII: smile symmetric, reduced sensation to light touch on the left side of face VIII: hearing normal bilaterally IX,X: uvula rises symmetrically XI: bilateral shoulder shrug XII: midline tongue extension Motor: Right : Upper extremity   5/5    Left:     Upper extremity   5/5  Lower extremity   4/5     Lower extremity   5/5 Tone and bulk:normal tone throughout; no atrophy noted Sensory: Reduced sensation to light touch, temperature and bilateral arms below the elbow, reduced sensation of the left side of her face. Deep Tendon Reflexes: 3+ reflexes  in the right patella, 2+ on the left Plantars: Right: downgoing   Left: downgoing Cerebellar: normal finger-to-nose, normal rapid alternating movements and normal heel-to-shin test Gait: normal gait and station     Lab Results: Basic Metabolic Panel: Recent Labs  Lab 02/21/20 1459  NA 140  K 4.0  CL 107  CO2 21*  GLUCOSE 143*  BUN 11  CREATININE 0.75  CALCIUM 8.7*    CBC: Recent Labs  Lab 02/21/20 1459  WBC 13.8*  HGB 13.2  HCT 42.4  MCV 87.8  PLT 387    Coagulation Studies: No results for input(s): LABPROT, INR in the last 72 hours.  Imaging: No results found.   I have reviewed the above imaging : none   ASSESSMENT AND PLAN   24 y.o. female with past medical history significant for multiple sclerosis, type 1 diabetes mellitus, obesity presents to the emergency department with 2-day history of bilateral numbness in both arms as well as over the left side of her face as well as generalized weakness  Multiple sclerosis exacerbation -Patient has recently had MRIs in March and April, so will defer repeating MRI brain as this would not change her management. -Continue IV Solu-Medrol and to 3 days -PT OT evaluation -Follow-up with Dr. Epimenio Foot for second dose of Ocrevus     Mackson Botz [facility] Pager Number [phone]

## 2020-02-21 NOTE — H&P (Signed)
Date: 02/21/2020               Patient Name:  Danielle Baker MRN: [id number]  DOB: August 31, 1995 Age / Sex: 24 y.o., female   PCP: Abner Greenspan, MD         Medical Service: Internal Medicine Teaching Service         Attending Physician: Dr. Jessy Oto, MD    First Contact: Dr. Doran Stabler, MD Pager: [phone]  Second Contact: Dr. Jodelle Red, MD Pager: [phone]       After Hours (After 5p/  First Contact Pager: [phone]  weekends / holidays): Second Contact Pager: [phone]   Chief Complaint: weakness  History of Present Illness: [name] is 23yoF w/ MS, T1DM who presents to ED with bilateral weakness in upper and lower extremities. Patient reports intermittent chronic weakness, numbness since her MS diagnosis in 10/2019. One month ago patient completed 1 dose of IV salumedrol in clinic followed by 6d prednisone. She also recently received first infusion of Ocrevus on 02/15/20. Over the past few days patient says symptoms have worsened and are affecting legs and arms bilaterally. Previously, her symptoms mainly affected the left leg & arm. She also describes malaise, left-sided facial weakness, double vision, and dizziness that have acutely worsened during this time. Patient also has decreased sensation of temperature in L face, bilateral arms. The dizziness is associated with nausea and mild-moderate HA. Denies pain with eye movement, color changes. On 7/4, patient reports episode of bladder incontinence without urgency while standing in kitchen. No hx of prior bladder incontinence. Denies fevers, CP, SOB, dysuria. Of note, patient fell down stairs 02/04/20 after her legs gave out. She was evaluated in urgent care clinic and found to have fracture of 5th R metatarsal. Patient followed by podiatry (last visit 02/21/20).  On arrival, patient afebrile, tachycardic, sating well on RA. In ED, patient started on IV salumedrol and neuro consulted. IMTS consulted for admission for management of sugars  in setting of high dose steroids.   Past Medical History:  Diagnosis Date  . Heart murmur    as a child  . Hypoglycemia associated with diabetes (HCC)   . Multiple sclerosis (HCC)   . Obesity, morbid (HCC)   . Pancreatitis   . Tachycardia   . Type 1 diabetes mellitus not at goal [facility])    since age of 24 years old    Meds:  Current Meds  Medication Sig  . acetaminophen (TYLENOL) 325 MG tablet Take 650 mg by mouth every 6 (six) hours as needed for mild pain.  . busPIRone (BUSPAR) 15 MG tablet Take 1 tablet (15 mg total) by mouth 2 (two) times daily.  Marland Kitchen dimenhyDRINATE (DRAMAMINE) 50 MG tablet Take 50 mg by mouth every 8 (eight) hours as needed for nausea or dizziness.  Marland Kitchen escitalopram (LEXAPRO) 20 MG tablet Take 20 mg by mouth daily.  Marland Kitchen HUMALOG KWIKPEN 100 UNIT/ML KwikPen Inject 23 Units into the skin in the morning, at noon, and at bedtime.  Marland Kitchen ibuprofen (ADVIL) 200 MG tablet Take 400 mg by mouth every 6 (six) hours as needed for headache or moderate pain.  Marland Kitchen ocrelizumab (OCREVUS) 300 MG/10ML injection Inject 300 mg into the vein once. Every six months.  . ondansetron (ZOFRAN ODT) 4 MG disintegrating tablet Take 1 tablet (4 mg total) by mouth every 8 (eight) hours as needed. (Patient taking differently: Take 4 mg by mouth every 8 (eight) hours as needed for nausea or vomiting. )  .  phentermine 37.5 MG capsule Take 1 capsule (37.5 mg total) by mouth every morning.  Marland Kitchen TOUJEO MAX SOLOSTAR 300 UNIT/ML Solostar Pen Inject 40 Units into the skin 2 (two) times daily.    Allergies: Allergies as of 02/21/2020 - Review Complete 02/21/2020  Allergen Reaction Noted  . Enalapril Other (See Comments) 08/30/2013  . Morphine and related Rash 12/16/2010    Family History:  Family History  Problem Relation Age of Onset  . Thyroid disease Mother   . Obesity Mother   . Diabetes Father   . Obesity Father   . Obesity Sister   . Cancer Maternal Grandmother   . Obesity Paternal Grandfather      Social History:  Social History   Tobacco Use  . Smoking status: Never Smoker  . Smokeless tobacco: Never Used  Vaping Use  . Vaping Use: Never used  Substance Use Topics  . Alcohol use: No    Alcohol/week: 0.0 standard drinks  . Drug use: No    Review of Systems: A complete ROS was negative except as per HPI.   Physical Exam: Blood pressure (!) 139/95, pulse (!) 119, temperature 98.6 F (37 C), temperature source Oral, resp. rate (!) 23, SpO2 98 %, unknown if currently breastfeeding. Physical Exam Constitutional:      General: She is not in acute distress.    Appearance: She is not ill-appearing.  HENT:     Head: Normocephalic and atraumatic.     Mouth/Throat:     Mouth: Mucous membranes are moist.  Eyes:     General: Lids are normal. No scleral icterus.    Extraocular Movements: Extraocular movements intact.     Conjunctiva/sclera: Conjunctivae normal.  Cardiovascular:     Rate and Rhythm: Regular rhythm. Tachycardia present.     Heart sounds: Normal heart sounds. No murmur heard.   Pulmonary:     Effort: Pulmonary effort is normal.     Breath sounds: Normal breath sounds.  Abdominal:     General: Bowel sounds are normal. There is no distension.     Palpations: Abdomen is soft.     Tenderness: There is no abdominal tenderness.  Musculoskeletal:     Right lower leg: No edema.     Left lower leg: No edema.  Feet:     Comments: Brace on RLE Skin:    General: Skin is warm.     Coloration: Skin is not jaundiced.  Neurological:     Mental Status: She is alert and oriented to person, place, and time.     Comments: Left-sided facial sensory deficit. Facial motor function intact bilaterally. Sensory deficit bilaterally on upper extremities to mid-upper arm. Weakness bilaterally in upper extremities. Bilateral lower extremity sensation, motor function in tact. Finger to nose normal.   Psychiatric:        Attention and Perception: Attention normal.        Mood and  Affect: Mood normal.        Speech: Speech normal.        Behavior: Behavior normal.        Thought Content: Thought content normal.    Labs: -BMP: Na 140 / K 4.0 / Cl 107 / CO2 21 / Glucose 143 / BUN 11 / Cr .75 / AG 12  -CBC: WBC 13.8 / Hgb 13.2 / Hct 42.4 / Plt 387  -UA: pH 7.0 / Glucose >500 / Leukocytes trace / Bili/Ketones/Protein/Nitrite neg / WBC, RBC 0-5  Assessment & Plan by Problem: Active Problems:  Exacerbation of multiple sclerosis (HCC) Patient is 23yo F w/ MS, T1DM who presents to ED w/ bilateral weakness, numbness in UE and intermittent weakness in LE consistent with MS flare.  #MS exacerbation Patient reports intermittent symptoms since dx 10/2019. Previous admission for flare in 11/2019. Seen by Dr. Epimenio Foot at [facility] Ocrevus infusion 6/30. Last MRI 11/22/19 during last admission. Symptoms have progressed to bilateral involvement (previously L>R) along with generalized weakness.  -Neurology consulted, appreciate recs -Hold r/p MRI, as would not change management -IV 500mg  Salumedrol in ED, will give another 500mg  tonight -IV 1000mg  Salumedrol for two more days (7/7, 7/8) -Neuro checks q4hr -PT/OT eval -Second Ocrevus infusion scheduled for 7/13 w/ Dr. 04-19-1998  #T1 DM Dx in 2002, multiple episodes of DKA in past. Current home regimen: Lantus 40u BID, Humalog 23u TID w/ meals. On admission, glucose 143. Given high-dose steroids will monitor CBGs closely to prevent DKA. -Lantus 40u BID + Aspart 23U TID w/ meals + SSI -CBG's q4hr  #Fractured R metatarsal Patient fell down stairs 6/19, evaluated for R metatarsal fracture. Currently in brace, seen by Dr. Epimenio Foot at [facility], last visit 02/21/20. -Continue with CAM boot -Patient to f/u w/ Dr. 7/19 in 4 weeks  #GERD, nausea -Continue Protonix 40mg  -Zofran 4mg  PRN  Diet: CM IVF: NS 145mL/hr DVT PPX: Lovenox 40mg  Code Status: Full  Dispo: Admit patient to Inpatient with expected  length of stay greater than 2 midnights.  Signed: 04/23/20, MD 02/21/2020, 10:50 PM  Pager: @MYPAGER @ After 5pm on weekdays and 1pm on weekends: On Call pager: [phone]

## 2020-02-21 NOTE — ED Triage Notes (Signed)
Pt reports hx of MS, states she had injection of her MS medicine last week. Reports numbness to bilateral arms and legs as well as dizziness. States that her grips feel weak bilaterally and her legs feel weak when she walks. Talked to dr sade who advised that she come here for further evaluation. Pt a/ox4, speech clear

## 2020-02-22 DIAGNOSIS — E8889 Other specified metabolic disorders: Secondary | ICD-10-CM

## 2020-02-22 LAB — BASIC METABOLIC PANEL
Anion gap: 12 (ref 5–15)
Anion gap: 11 (ref 5–15)
BUN: 9 mg/dL (ref 6–20)
BUN: 10 mg/dL (ref 6–20)
CO2: 16 mmol/L — ABNORMAL LOW (ref 22–32)
CO2: 17 mmol/L — ABNORMAL LOW (ref 22–32)
Calcium: 8.5 mg/dL — ABNORMAL LOW (ref 8.9–10.3)
Calcium: 8.3 mg/dL — ABNORMAL LOW (ref 8.9–10.3)
Chloride: 108 mmol/L (ref 98–111)
Chloride: 110 mmol/L (ref 98–111)
Creatinine, Ser: 0.66 mg/dL (ref 0.44–1.00)
Creatinine, Ser: 0.55 mg/dL (ref 0.44–1.00)
GFR calc Af Amer: >60 mL/min (ref >60)
GFR calc Af Amer: >60 mL/min (ref >60)
GFR calc non Af Amer: >60 mL/min (ref >60)
GFR calc non Af Amer: >60 mL/min (ref >60)
Glucose, Bld: 278 mg/dL — ABNORMAL HIGH (ref 70–99)
Glucose, Bld: 272 mg/dL — ABNORMAL HIGH (ref 70–99)
Potassium: 4.6 mmol/L (ref 3.5–5.1)
Potassium: 3.9 mmol/L (ref 3.5–5.1)
Sodium: 136 mmol/L (ref 135–145)
Sodium: 138 mmol/L (ref 135–145)

## 2020-02-22 LAB — CBC
HCT: 42 % (ref 36.0–46.0)
Hemoglobin: 12.9 g/dL (ref 12.0–15.0)
MCH: 27.1 pg (ref 26.0–34.0)
MCHC: 30.7 g/dL (ref 30.0–36.0)
MCV: 88.2 fL (ref 80.0–100.0)
Platelets: 321 10*3/uL (ref 150–400)
RBC: 4.76 MIL/uL (ref 3.87–5.11)
RDW: 14.6 % (ref 11.5–15.5)
WBC: 11.6 10*3/uL — ABNORMAL HIGH (ref 4.0–10.5)
nRBC: 0 % (ref 0.0–0.2)

## 2020-02-22 LAB — CBG MONITORING, ED
Glucose-Capillary: 204 mg/dL — ABNORMAL HIGH (ref 70–99)
Glucose-Capillary: 228 mg/dL — ABNORMAL HIGH (ref 70–99)
Glucose-Capillary: 282 mg/dL — ABNORMAL HIGH (ref 70–99)
Glucose-Capillary: 289 mg/dL — ABNORMAL HIGH (ref 70–99)

## 2020-02-22 LAB — GLUCOSE, CAPILLARY: Glucose-Capillary: 283 mg/dL — ABNORMAL HIGH (ref 70–99)

## 2020-02-22 LAB — BETA-HYDROXYBUTYRIC ACID: Beta-Hydroxybutyric Acid: 1.37 mmol/L — ABNORMAL HIGH (ref 0.05–0.27)

## 2020-02-22 MED ORDER — INSULIN ASPART 100 UNIT/ML ~~LOC~~ SOLN
0.0000 [IU] | SUBCUTANEOUS | Status: DC
  Administered 2020-02-22: 7 [IU] via SUBCUTANEOUS
  Administered 2020-02-22: 11 [IU] via SUBCUTANEOUS
  Administered 2020-02-22: 7 [IU] via SUBCUTANEOUS
  Administered 2020-02-22: 11 [IU] via SUBCUTANEOUS

## 2020-02-22 MED ORDER — METOCLOPRAMIDE HCL 5 MG/ML IJ SOLN
10.0000 mg | Freq: Once | INTRAMUSCULAR | Status: AC
  Administered 2020-02-22: 10 mg via INTRAVENOUS
  Filled 2020-02-22 (×2): qty 2

## 2020-02-22 MED ORDER — INSULIN ASPART 100 UNIT/ML ~~LOC~~ SOLN: 10.0000 [IU] | Freq: Three times a day (TID) | SUBCUTANEOUS | Status: DC

## 2020-02-22 MED ORDER — INSULIN ASPART 100 UNIT/ML ~~LOC~~ SOLN: 0.0000 [IU] | Freq: Every day | SUBCUTANEOUS | Status: DC

## 2020-02-22 MED ORDER — LACTATED RINGERS IV SOLN: INTRAVENOUS | Status: DC

## 2020-02-22 MED ORDER — INSULIN GLARGINE 100 UNIT/ML ~~LOC~~ SOLN
45.0000 [IU] | Freq: Two times a day (BID) | SUBCUTANEOUS | Status: DC
  Administered 2020-02-22: 45 [IU] via SUBCUTANEOUS
  Filled 2020-02-22 (×3): qty 0.45

## 2020-02-22 MED ORDER — INSULIN ASPART 100 UNIT/ML ~~LOC~~ SOLN
0.0000 [IU] | Freq: Three times a day (TID) | SUBCUTANEOUS | Status: DC
  Administered 2020-02-23: 11 [IU] via SUBCUTANEOUS
  Administered 2020-02-23: 7 [IU] via SUBCUTANEOUS

## 2020-02-22 MED ORDER — LACTATED RINGERS IV BOLUS
1000.0000 mL | Freq: Once | INTRAVENOUS | Status: AC
  Administered 2020-02-22: 1000 mL via INTRAVENOUS

## 2020-02-22 NOTE — ED Notes (Signed)
Pt resting comfortably in bed with eye closed no acute distress noted, continuous cardiac monitoring, call bell in reach.

## 2020-02-22 NOTE — ED Notes (Signed)
Pt's CBG result was 204. Informed Mikayla - RN.

## 2020-02-22 NOTE — Progress Notes (Signed)
Subjective: Feels slightly better from an MS perspective, also has unilateral right retro-ocular headache.  No photophobia but did have some nausea previously.  She has a history of migraines, but has not had one in years.  Exam: Vitals:   02/22/20 1300 02/22/20 1305  BP: 136/90   Pulse: 94   Resp:  18  Temp:    SpO2: 95%    Gen: In bed, NAD Resp: non-labored breathing, no acute distress Abd: soft, nt  Neuro: MS: Awake, alert, interactive and appropriate CN: EOMI PERRL Motor: No drift in bilateral arms   Pertinent Labs: Glucose 228  Impression: 24 year old female admitted for steroids for MS flare given her history of diabetes and need for closer glucose monitoring.  I suspect that her headache may be migrainous in nature, will use Reglan.  Recommendations: 1) continue steroids for total of 3 days 2) Reglan x1 3) okay to discharge after last dose once safe from diabetic standpoint.  Ritta Slot, MD [facility] [phone]  If 7pm- 7am, please page neurology on call as listed in AMION.

## 2020-02-22 NOTE — Evaluation (Signed)
Physical Therapy Evaluation Patient Details Name: Danielle Baker MRN: [id number] DOB: [birth year] Today's Date: 02/22/2020   History of Present Illness  Pt is 24 yo female with MS (diagnosed earlier this year) and type 1 DM.  She presented to the ED with bil weakness in upper and lower extremities. Pt admitted with MS exacerbation and has been started on steriods.  Pt with recent fall with fractured R metatarsal 6/19 - cont WBAT and CAM boot.  Clinical Impression  Pt admitted with MS flair.  She demonstrates independent mobility without LOB.  Reports her current symptoms are similar to baseline.  Pt is doing well with CAM boot.  Pt verbalizes good understanding of prior HEP, MS, energy conservation, and temperature regulation.  No further acute PT indicated.  Pt with good support and necessary DME at home.     Follow Up Recommendations No PT follow up (did educate on outpt neuro PT if she felt she ever needed it)    Equipment Recommendations  None recommended by PT    Recommendations for Other Services       Precautions / Restrictions Precautions Precautions: None Required Braces or Orthoses: Other Brace Other Brace: R CAM boot WBAT      Mobility  Bed Mobility Overal bed mobility: Needs Assistance Bed Mobility: Supine to Sit;Sit to Supine     Supine to sit: Independent Sit to supine: Independent      Transfers Overall transfer level: Independent               General transfer comment: Pt has been ambulating in room independently  Ambulation/Gait Ambulation/Gait assistance: Supervision Gait Distance (Feet): 200 Feet Assistive device: IV Pole Gait Pattern/deviations: Step-through pattern     General Gait Details: steady gait; no LOB  Stairs            Wheelchair Mobility    Modified Rankin (Stroke Patients Only)       Balance Overall balance assessment: Independent                                           Pertinent  Vitals/Pain Pain Assessment: 0-10 Pain Score: 1  Pain Location: headache Pain Descriptors / Indicators: Aching Pain Intervention(s): Limited activity within patient's tolerance;Premedicated before session    Home Living Family/patient expects to be discharged to:: Private residence Living Arrangements: Spouse/significant other;Other relatives (sister staying as needed) Available Help at Discharge: Family;Available 24 hours/day Type of Home: House Home Access: Stairs to enter Entrance Stairs-Rails: Can reach both Entrance Stairs-Number of Steps: 3 Home Layout: Multi-level;Bed/bath upstairs Home Equipment: Emergency planning/management officer - 4 wheels      Prior Function Level of Independence: Independent         Comments: Pt still independent just reports she has modified as needed.  Recent fall down stairs with R 5th metatarsal fx and L foot injury - reports fall due to looking down and legs went numb. 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  Has small kids at home     Hand Dominance   Dominant Hand: Right    Extremity/Trunk Assessment   Upper Extremity Assessment Upper Extremity Assessment: Defer to OT evaluation    Lower Extremity Assessment Lower Extremity Assessment: LLE deficits/detail;RLE deficits/detail RLE Deficits / Details:  Overall WFL, did not test ankle due to metatarsal fx.  Does reports "fuzziness/static" in low back and legs at all times.  Reports legs go numb when she looks down.  Reports these symptoms have been consistent. LLE Deficits / Details: Overall WFL, did not test ankle due to metatarsal fx.  Does reports "fuzziness/static" in low back and legs at all times.  Reports legs go numb when she looks down.  Reports these symptoms have been consistent.    Cervical / Trunk Assessment Cervical / Trunk Assessment: Normal  Communication   Communication: No difficulties   Cognition Arousal/Alertness: Awake/alert Behavior During Therapy: WFL for tasks assessed/performed Overall Cognitive Status: Within Functional Limits for tasks assessed                                        General Comments General comments (skin integrity, edema, etc.): VSS; Pt verbalized that she has been performing HEP of habituation exercises for vertigo and UE strengthening.  Also, verbalized good understanding of energy conservation and temperature regulation with MS.    Exercises     Assessment/Plan    PT Assessment Patent does not need any further PT services  PT Problem List         PT Treatment Interventions      PT Goals (Current goals can be found in the Care Plan section)  Acute Rehab PT Goals Patient Stated Goal: return home to kids as soon as finished with meds PT Goal Formulation: All assessment and education complete, DC therapy Time For Goal Achievement: 02/22/20 Potential to Achieve Goals: Good    Frequency     Barriers to discharge        Co-evaluation               AM-PAC PT "6 Clicks" Mobility  Outcome Measure Help needed turning from your back to your side while in a flat bed without using bedrails?: None Help needed moving from lying on your back to sitting on the side of a flat bed without using bedrails?: None Help needed moving to and from a bed to a chair (including a wheelchair)?: None Help needed standing up from a chair using your arms (e.g., wheelchair or bedside chair)?: None Help needed to walk in hospital room?: None Help needed climbing 3-5 steps with a railing? : None 6 Click Score: 24    End of Session   Activity Tolerance: Patient tolerated treatment well Patient left: in bed;with call bell/phone within reach Nurse Communication: Mobility status      Time: 4098-1191 PT Time Calculation (min) (ACUTE ONLY): 25 min   Charges:   PT Evaluation $PT Eval Low Complexity: 1 Low          Danielle Baker,  PT Acute Rehab Services Pager [phone] [facility] [phone]    Danielle Baker 02/22/2020, 5:05 PM

## 2020-02-22 NOTE — Progress Notes (Addendum)
Subjective:  Danielle Baker was examined at bedside this morning and was noted to be sleeping comfortably in bed. She expressed that since last night she is feeling better though she has a lingering mild headache and abdominal pain. She states that compared to her MS flare in April her current sxs are similar though she has new symptoms now including tingling in her nose and some vertigo when rotating her head. Additionally when flexing her neck she feels some paraesthesia in her legs bilaterally. She reports little to no feeling in her hands or lower arms and some intermittent weakness in her legs bilaterally.   During her last flare she states that she received only 3 days of steroids because she had an outpatient neurology f/u to attend. She would like to follow the same 3 day course this time as well and will follow up with her Neurologist next Tuesday 7/13 for her second Ocrevus dose. She denies nausea, vomiting, diarrhea, shortness of breath, and chest pain at this time.    Objective: Vital signs in last 24 hours: Vitals:   02/22/20 0322 02/22/20 0400 02/22/20 0500 02/22/20 0700  BP:  115/62 128/79 133/83  Pulse: 91 80 81 77  Resp: 20 19 18 19   Temp:      TempSrc:      SpO2: 96% 97% 98% 95%   Weight change:   Intake/Output Summary (Last 24 hours) at 02/22/2020 0949 Last data filed at 02/22/2020 0107 Gross per 24 hour  Intake 553.95 ml  Output --  Net 553.95 ml   ROS Negative unless otherwise specified in HPI.  Physical Exam Constitutional:      Appearance: Normal appearance. She is obese.  Cardiovascular:     Rate and Rhythm: Normal rate and regular rhythm.     Pulses: Normal pulses.     Heart sounds: Normal heart sounds. No murmur heard.  No friction rub. No gallop.   Pulmonary:     Effort: Pulmonary effort is normal. No respiratory distress.     Breath sounds: Normal breath sounds. No wheezing or rales.  Abdominal:     General: Abdomen is flat. Bowel sounds are normal.  There is no distension.     Palpations: Abdomen is soft.     Tenderness: There is abdominal tenderness. There is no guarding or rebound.  Skin:    General: Skin is warm and dry.  Neurological:     Mental Status: She is alert and oriented to person, place, and time.     Cranial Nerves: No cranial nerve deficit, dysarthria or facial asymmetry.     Sensory: Sensory deficit (Decreased sensation left side of face, and bilateral forearms below the elbow - most pronounced distally ) present.     Motor: Weakness (Strength: LUE 1/5, RUE 2/5, Grip strength L: 1/5, R: 2/5, Bilateral LE 4/5) present.     Comments:      Lab Results: CBC Latest Ref Rng & Units 02/22/2020 02/21/2020 11/24/2019  WBC 4.0 - 10.5 K/uL 11.6(H) 13.8(H) 17.8(H)  Hemoglobin 12.0 - 15.0 g/dL 01/24/2020 67.6 19.5  Hematocrit 36 - 46 % 42.0 42.4 38.0  Platelets 150 - 400 K/uL 321 387 312   CMP Latest Ref Rng & Units 02/22/2020 02/21/2020 11/24/2019  Glucose 70 - 99 mg/dL 01/24/2020) 267(T) 245(Y)  BUN 6 - 20 mg/dL 9 11 14   Creatinine 0.44 - 1.00 mg/dL 099(I 3.38  Sodium 135 - 145 mmol/L 136 140 139  Potassium 3.5 - 5.1 mmol/L 4.6 4.0 4.1  Chloride 98 - 111 mmol/L 108 107 107  CO2 22 - 32 mmol/L 16(L) 21(L) 21(L)  Calcium 8.9 - 10.3 mg/dL 8.1(E) 7.5(T) 7.0(Y)  Total Protein 6.5 - 8.1 g/dL - - -  Total Bilirubin 0.3 - 1.2 mg/dL - - -  Alkaline Phos 38 - 126 U/L - - -  AST 15 - 41 U/L - - -  ALT 0 - 44 U/L - - -   Assessment/Plan: Principal Problem:   Exacerbation of multiple sclerosis (HCC) Active Problems:   Obesity, morbid (HCC)   Diabetes mellitus type 1, uncontrolled, insulin dependent (HCC)  Danielle Baker is a 24 yo Female with PMHx of MS and T1DM with bilateral extremity weakness and UE and facial loss of sensation who was admitted for a MS flare.  #MS Exacerbation Recent diagnosis of MS in March of 2021 with intermittent weakness and paraesthesias. Recent hospitalization in 11/2019 for flare and was tx with 3 days of IV  Solu-Medrol. Currently has left facial decreased sensation, bilateral upper extremity weakness and loss of sensation, as well as mild lower extremity weakness. Pt received 1 gram of steroids last night and reports mild improvement in her symptoms this morning. -Continue IV Solu-Medrol today, 1g, day 2/3 -Monitor for neurologic improvement -F/u outpatient Neuro next week, 7/13  #T1DM, Uncontrolled A1c on 7/6 was 10.0. Has required multiple steroid txs in the past several months including 5 days of Solu-Medrol 11/07/19, 3 days IV Solu-Medrol  11/22/19, and a 6 day prednisone taper 6/8 for tx of her MS. This is likely causing elevation in her glucose that is poorly managed with her home insulin regimen. Given current steroid therpy, will follow CBG closely and treat accordingly. Per diabetes coordinator, concern for possible DKA with declining CO2. Anion gap currently normal. Monitor BMP q4 and Beta-hydroxybutyric acid level today. -Novolog, SSI -Lantus 45 units BID -CBG q4hr -BMP q4hr -BHA level  #R 5th Metatarsal Fracture Pt fell after experiencing weakness and falling down the stairs on 02/04/20. Patient reports improvement in her pain, swelling, and bruising at this time. -Continue foot brace -F/u outpatient Ortho  This is a Psychologist, occupational Note.  The care of the patient was discussed with Dr. Cyndie Chime and the assessment and plan formulated with their assistance.  Please see their attached note for official documentation of the daily encounter.   LOS: 1 day   Jori Moll, Medical Student 02/22/2020, 9:49 AM

## 2020-02-22 NOTE — Progress Notes (Addendum)
Inpatient Diabetes Program Recommendations  AACE/ADA: New Consensus Statement on Inpatient Glycemic Control (2015)  Target Ranges:  Prepandial:   less than 140 mg/dL      Peak postprandial:   less than 180 mg/dL (1-2 hours)      Critically ill patients:  140 - 180 mg/dL   Lab Results  Component Value Date   GLUCAP 228 (H) 02/22/2020   HGBA1C 10.0 (H) 02/21/2020    Review of Glycemic Control Results for Danielle Baker, Danielle Baker (MRN [id number]) as of 02/22/2020 10:46  Ref. Range 02/21/2020 21:31 02/22/2020 00:36 02/22/2020 07:49  Glucose-Capillary Latest Ref Range: 70 - 99 mg/dL 387 (H) 564 (H) 332 (H)   Diabetes history: Type 1 DM Outpatient Diabetes medications: Toujeo 40 units BID, Humalog 23 units TID Current orders for Inpatient glycemic control: Novolog 0-20 units Q4H, Lantus 40 units BID  Inpatient Diabetes Program Recommendations:    Glucose trends increasing, assuming related to steroids.  Of note, BMET result shows decrease to CO2 and patient has history of DKA.   Consider:  Beta hydroxybutyric acid  BMET Q4H.  Consider IV insulin if beta >0.5.    If lab results within normal limits consider: Increasing Lantus 45 units BID Adding Novolog 10 units TID (assuming patient is consuming >50% of meal).  Novolog 0-20 units TID  Addendum: Spoke with patient regarding outpatient diabetes management. Verified outpatient medications and patient reports being off steroids for three weeks. Patient is followed by Dr Joellyn Quails, outpatient endocrinology. Reviewed patient's current A1c of 10.0%. Explained what a A1c is and what it measures. Also reviewed goal A1c with patient, importance of good glucose control @ home, and blood sugar goals. Reviewed patho of DM, need for insulin, role of pancreas, impact of steroids to glucose control, signs and symptoms of hypoglycemia, interventions, vascular changes, and commorbidities.  Patient has a meter and testing supplies. Encouraged to check 2-3 times per day.  Reviewed when to call MD. Additionally, encouraged reaching out to endocrinology as needed to match steroid doses with appropriate insulin doses.  Patient does not have any further questions at this time.   @1420 : Labs ordered.   Thanks, , MSN, RNC-OB Diabetes Coordinator [phone] (8a-5p)

## 2020-02-22 NOTE — ED Notes (Signed)
Breakfast ordered 

## 2020-02-23 LAB — CBC
HCT: 37.9 % (ref 36.0–46.0)
Hemoglobin: 11.9 g/dL — ABNORMAL LOW (ref 12.0–15.0)
MCH: 27.8 pg (ref 26.0–34.0)
MCHC: 31.4 g/dL (ref 30.0–36.0)
MCV: 88.6 fL (ref 80.0–100.0)
Platelets: 351 10*3/uL (ref 150–400)
RBC: 4.28 MIL/uL (ref 3.87–5.11)
RDW: 15.1 % (ref 11.5–15.5)
WBC: 12 10*3/uL — ABNORMAL HIGH (ref 4.0–10.5)
nRBC: 0 % (ref 0.0–0.2)

## 2020-02-23 LAB — GLUCOSE, CAPILLARY
Glucose-Capillary: 241 mg/dL — ABNORMAL HIGH (ref 70–99)
Glucose-Capillary: 205 mg/dL — ABNORMAL HIGH (ref 70–99)
Glucose-Capillary: 277 mg/dL — ABNORMAL HIGH (ref 70–99)

## 2020-02-23 LAB — BASIC METABOLIC PANEL
Anion gap: 9 (ref 5–15)
BUN: 15 mg/dL (ref 6–20)
CO2: 19 mmol/L — ABNORMAL LOW (ref 22–32)
Calcium: 8.3 mg/dL — ABNORMAL LOW (ref 8.9–10.3)
Chloride: 111 mmol/L (ref 98–111)
Creatinine, Ser: 0.58 mg/dL (ref 0.44–1.00)
GFR calc Af Amer: >60 mL/min (ref >60)
GFR calc non Af Amer: >60 mL/min (ref >60)
Glucose, Bld: 232 mg/dL — ABNORMAL HIGH (ref 70–99)
Potassium: 4.2 mmol/L (ref 3.5–5.1)
Sodium: 139 mmol/L (ref 135–145)

## 2020-02-23 MED ORDER — INSULIN ASPART 100 UNIT/ML ~~LOC~~ SOLN
5.0000 [IU] | Freq: Three times a day (TID) | SUBCUTANEOUS | Status: DC
  Administered 2020-02-23: 5 [IU] via SUBCUTANEOUS

## 2020-02-23 MED ORDER — INSULIN GLARGINE 100 UNIT/ML ~~LOC~~ SOLN
50.0000 [IU] | Freq: Two times a day (BID) | SUBCUTANEOUS | Status: DC
  Administered 2020-02-23: 50 [IU] via SUBCUTANEOUS
  Filled 2020-02-23 (×2): qty 0.5

## 2020-02-23 MED FILL — ESCITALOPRAM 20 MG TABLET: 20 | 30 days supply | Qty: 30 | Fill #0

## 2020-02-23 MED FILL — HUMALOG 100 UNITS/ML KWIKPE: 100 | 30 days supply | Qty: 27 | Fill #4

## 2020-02-23 NOTE — Progress Notes (Addendum)
Subjective:  Danielle Baker was evaluated at bedside this morning and was noted to be resting comfortably in her bed. She reports feeling better this morning as compared to yesterday or last night. She expresses improvement in her headache and abdominal pain. Her vertigo is minimal now even with ambulation to the bathroom this morning. She describes feeling ready to leave after her final dose of IV Solu-Medrol this afternoon.  We spoke with her regarding changes in her diabetes management over the next several days since she has been on steroids. She expressed that she has a good sense of how steroids have affected her in the past with previous MS flare treatments. She noted understanding of the increased need for insulin over the coming days and states that she has managed her diabetes for many years and feels comfortable with it.   Danielle Baker was left lying comfortably in her bed.  Objective: Vital signs in last 24 hours: Vitals:   02/22/20 2342 02/23/20 0348 02/23/20 0803 02/23/20 1248  BP: 120/80 109/64 108/71 (!) 143/107  Pulse: 92 72 85 86  Resp: 17 18 20 16   Temp: 98.9 F (37.2 C) 98.6 F (37 C) (!) 97.4 F (36.3 C) 98.8 F (37.1 C)  TempSrc:   Oral Oral  SpO2: 96% 99% 98% 97%   Weight change:   Intake/Output Summary (Last 24 hours) at 02/23/2020 1404 Last data filed at 02/22/2020 2200 Gross per 24 hour  Intake 150 ml  Output --  Net 150 ml   ROS Negative unless otherwise specified in the HPI.  Physical Exam Constitutional:      Appearance: Normal appearance. She is obese.  Eyes:     Extraocular Movements: Extraocular movements intact.  Cardiovascular:     Rate and Rhythm: Normal rate and regular rhythm.     Heart sounds: No murmur heard.  No friction rub. No gallop.   Pulmonary:     Effort: Pulmonary effort is normal.     Breath sounds: Normal breath sounds.  Abdominal:     General: Abdomen is flat. Bowel sounds are normal.     Palpations: Abdomen is soft.      Tenderness: There is no abdominal tenderness. There is no guarding or rebound.  Neurological:     Mental Status: She is alert and oriented to person, place, and time.     Cranial Nerves: No cranial nerve deficit, dysarthria or facial asymmetry.     Sensory: Sensory deficit (Left side of face, UE below the elbow bilaterally) present.     Motor: Weakness (Strength: RUE: 2/5, LUE: 1/5, Grip Strength R: 2/5, L:1/5, Bilateral LE: 4/5) present.  Psychiatric:        Mood and Affect: Mood normal.        Behavior: Behavior normal.    Lab Results: CBC Latest Ref Rng & Units 02/23/2020 02/22/2020 02/21/2020  WBC 4.0 - 10.5 K/uL 12.0(H) 11.6(H) 13.8(H)  Hemoglobin 12.0 - 15.0 g/dL 11.9(L) 12.9 13.2  Hematocrit 36 - 46 % 37.9 42.0 42.4  Platelets 150 - 400 K/uL 351 321 387   CMP Latest Ref Rng & Units 02/23/2020 02/22/2020 02/22/2020  Glucose 70 - 99 mg/dL 04/24/2020) 299(M) 426(S)  BUN 6 - 20 mg/dL 15 10 9   Creatinine 0.44 - 1.00 mg/dL 341(D 6.22  Sodium 135 - 145 mmol/L 139 138 136  Potassium 3.5 - 5.1 mmol/L 4.2 3.9 4.6  Chloride 98 - 111 mmol/L 111 110 108  CO2 22 - 32 mmol/L 19(L) 17(L) 16(L)  Calcium 8.9 - 10.3 mg/dL 8.3(L) 8.3(L) 8.5(L)  Total Protein 6.5 - 8.1 g/dL - - -  Total Bilirubin 0.3 - 1.2 mg/dL - - -  Alkaline Phos 38 - 126 U/L - - -  AST 15 - 41 U/L - - -  ALT 0 - 44 U/L - - -   Assessment/Plan: Principal Problem:   Exacerbation of multiple sclerosis (HCC) Active Problems:   Obesity, morbid (HCC)   Diabetes mellitus type 1, uncontrolled, insulin dependent (HCC)   Ketosis (HCC)  Danielle Baker is a 24 yo Female with PMHx of MS and T1DM with bilateral extremity weakness and UE and facial loss of sensation who was admitted for a MS flare.   #MS Exacerbation  Danielle Baker reports continued improvement in her symptoms today. She reports improved headache and walking to the bathroom this morning. Her final infusion of Solu-Medrol is today and she is cleared to leave with follow up  instructions with her Neurologist 7/13 for her next Ocrevus infusion. -Final IV Solu-Medrol today, 1g, day 3/3 -F/u outpatient Neuro next week, 7/13   #T1DM, Uncontrolled Pt has had multiple steroid tx is the past and describes having a good sense of when it's affects on her diabetes wear off after discontinuing the steroids. We spoke with her about increasing her basal insulin dose to 50 units BID today and over the next several days as her CBG continue to return to a normal range for her. She can return to her normal Novolog 23 units TID with meals. She expresses that she will contact her Endocrinologist if any questions come up regarding her diabetes management after discharge today.  -Novolog, SSI -Lantus 50 units BID today -When pt is discharged she can give Glargine 50 units BID for 3 days, then resume her regular schedule of 40 units BID -Continue Humalog 23 units TID with meals per regular schedule  #Ketosis, resolved Elevation of Beta-Hydroxybutyric Acid yesterday to 1.37 with falling bicarb from 21 on 7/6 to 16 on 7/7. Anion gap has been under 12 consistently. Bicarb this morning is improved to 19. She likely had some low level of ketosis that has resolved with insulin therapy. No further management is necessary at this time.  #R 5th Metatarsal Fracture Pt fell after experiencing weakness and falling down the stairs on 02/04/20. Patient reports improvement in her pain, swelling, and bruising at this time. -Continue foot brace -F/u outpatient Ortho  This is a Psychologist, occupational Note.  The care of the patient was discussed with Dr. Cyndie Chime and the assessment and plan formulated with their assistance.  Please see their attached note for official documentation of the daily encounter.   LOS: 2 days   Jori Moll, Medical Student 02/23/2020, 2:04 PM

## 2020-02-23 NOTE — Evaluation (Signed)
Occupational Therapy Evaluation Patient Details Name: Danielle Baker MRN: [id number] DOB: [birth year] Today's Date: 02/23/2020    History of Present Illness Pt is 24 yo female with MS (diagnosed earlier this year) and type 1 DM.  She presented to the ED with bil weakness in upper and lower extremities. Pt admitted with MS exacerbation and has been started on steriods.  Pt with recent fall with fractured R metatarsal 6/19 - cont WBAT and CAM boot.   Clinical Impression   PTA pt reports being independent taking care of children and getting ready for nursing school. Pt was admitted for above and treated for problem list below (see OT Problem List). Demonstrates good safety awareness and energy conservation. Requires supervision with for ADLs, transfers, and ambulation for safety. 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 Noted weakness and sensation loss in hands and arms, but is still functional - pt able to brush teeth without any noted deficits. Believe pt would benefit from skilled OT services acutely and at the outpatient level to increase independence with ADLs and IADLs to get back to work and school.    Follow Up Recommendations  Outpatient OT    Equipment Recommendations  None recommended by OT       Precautions / Restrictions Precautions Precautions: Fall Required Braces or Orthoses: Other Brace Other Brace: R CAM boot WBAT Restrictions Weight Bearing Restrictions: Yes RLE Weight Bearing: Weight bearing as tolerated      Mobility Bed Mobility Overal bed mobility: Independent             General bed mobility comments: Independent with bed mobility supinee to OOB and EOB to supine  Transfers Overall transfer level: Needs assistance Equipment used: None Transfers: Sit to/from Stand Sit to Stand: Supervision         General transfer  comment: Supervision for safety    Balance Overall balance assessment: Independent                                         ADL either performed or assessed with clinical judgement   ADL Overall ADL's : Needs assistance/impaired     Grooming: Supervision/safety;Wash/dry hands;Standing Grooming Details (indicate cue type and reason): Supervision for safety Upper Body Bathing: Supervision/ safety;Standing Upper Body Bathing Details (indicate cue type and reason): Supervision for safety Lower Body Bathing: Supervison/ safety;Sit to/from stand;Sitting/lateral leans Lower Body Bathing Details (indicate cue type and reason): Supervision for safety Upper Body Dressing : Supervision/safety;Standing Upper Body Dressing Details (indicate cue type and reason): supervision for safety Lower Body Dressing: Supervision/safety;Sit to/from stand;Sitting/lateral leans Lower Body Dressing Details (indicate cue type and reason): with donning socks and CAM boot Toilet Transfer: Supervision/safety;Ambulation;Regular Teacher, adult education Details (indicate cue type and reason): Supervision for safety Toileting- Clothing Manipulation and Hygiene: Supervision/safety;Sit to/from stand;Sitting/lateral lean Toileting - Clothing Manipulation Details (indicate cue type and reason): Supervision for safety Tub/ Shower Transfer: Supervision/safety;Shower seat;Ambulation Tub/Shower Transfer Details (indicate cue type and reason): Supervision for safety Functional mobility during ADLs: Supervision/safety General ADL Comments: Supervision for safety with ADLs and functional mobility     Vision Baseline Vision/History: Wears glasses Wears Glasses: At all times Patient Visual Report: No change from baseline Vision Assessment?: No apparent visual deficits  Pertinent Vitals/Pain Pain Assessment: No/denies pain     Hand Dominance Right   Extremity/Trunk Assessment Upper Extremity  Assessment Upper Extremity Assessment: RUE deficits/detail;LUE deficits/detail RUE Deficits / Details: Decreased sensation and grip strength in R UE (shoulder flexion 3+/5, wrist flexion and elbow flexion 3/5, grip strength 3/5 and functional use seen with brushing teeth. RUE Sensation: decreased light touch RUE Coordination: WNL LUE Deficits / Details: Decreased sensation and grip strength in R UE (shoulder flexion 3+/5, wrist flexion and elbow flexion 3/5, grip strength 3/5 and functional use seen with brushing teeth. LUE Sensation: decreased light touch LUE Coordination: WNL   Lower Extremity Assessment Lower Extremity Assessment: Defer to PT evaluation   Cervical / Trunk Assessment Cervical / Trunk Assessment: Normal   Communication Communication Communication: No difficulties   Cognition Arousal/Alertness: Awake/alert Behavior During Therapy: WFL for tasks assessed/performed Overall Cognitive Status: Within Functional Limits for tasks assessed                                 General Comments: Go safety awareness and knowledge of deficits.   General Comments  Pt with good understanding of safety awareness and energy conservation. Pt explain she has been working on [employer] tasks at home with MS sensation and strength loss with flare ups. Pt expressed that she would like to do as much as possible so she can get back to work and school.             Home Living Family/patient expects to be discharged to:: Private residence Living Arrangements: Spouse/significant other;Other relatives Available Help at Discharge: Family;Available 24 hours/day Type of Home: House Home Access: Stairs to enter [employer] of Steps: 3 Entrance Stairs-Rails: Can reach both Home Layout: Multi-level;Bed/bath upstairs Alternate Level Stairs-Number of Steps: 7 Alternate Level Stairs-Rails: Right;Left Bathroom Shower/Tub: Producer, television/film/video: Standard     Home  Equipment: Emergency planning/management officer - 4 wheels   Additional Comments: sister living with them next couple of yrs per pt report, helps pt with her children. 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 Has children that sister helps take care of.        OT Problem List: Decreased strength;Decreased activity tolerance;Impaired sensation      OT Treatment/Interventions: Self-care/ADL training;Therapeutic exercise;Energy conservation;Therapeutic activities;Patient/family education    OT Goals(Current goals can be found in the care plan section) Acute Rehab OT Goals Patient Stated Goal: return home and get back to work and school OT Goal Formulation: With patient Time For Goal Achievement: 03/08/20 Potential to Achieve Goals: Good  OT Frequency: Min 2X/week    AM-PAC OT "6 Clicks" Daily Activity     Outcome Measure Help from another person eating meals?: A Little Help from another person taking care of personal grooming?: A Little Help from another person toileting, which includes using toliet, bedpan, or urinal?: A Little Help from another person bathing (including washing, rinsing, drying)?: A Little Help from another person to put on and taking off regular upper body clothing?: A Little Help from another person to put on and taking off regular lower body clothing?: A Little 6 Click Score: 18   End of Session Equipment Utilized During Treatment: Gait belt Nurse Communication: Mobility status  Activity Tolerance: Patient tolerated treatment well Patient left: in bed;with call bell/phone within reach;with bed alarm set  OT Visit Diagnosis: Muscle weakness (generalized) (M62.81);History of falling (Z91.81);Other symptoms and signs involving the nervous system (R29.898);Dizziness and giddiness (R42);Unsteadiness on  feet (R26.81);Other (comment) (decreased sensation)                Time: 3112-1624 OT Time Calculation (min): 17 min Charges:  OT General Charges $OT Visit: 1 Visit OT Evaluation $OT Eval Moderate Complexity: 1 Mod  Chantee Cerino/OTS Dotty Gonzalo 02/23/2020, 12:15 PM

## 2020-02-23 NOTE — Discharge Summary (Addendum)
Name: Danielle Baker MRN: [id number] DOB: 03/31/1996 24 y.o. PCP: Abner Greenspan, MD  Date of Admission: 02/21/2020  2:48 PM Date of Discharge: 02/23/20 Attending Physician: Anne Shutter, MD  Discharge Diagnosis: 1. Exacerbation of multiple sclerosis 2. Type 1 DM 3. Ketosis  4. Right fifth metatarsal fracture   Discharge Medications: Allergies as of 02/23/2020       Reactions   Enalapril Other (See Comments)   "Weak and blacked out" couldn't stand up. Pt states it dropped her BP too low    Morphine And Related Rash        Medication List     STOP taking these medications    predniSONE 10 MG tablet Commonly known as: DELTASONE       TAKE these medications    acetaminophen 325 MG tablet Commonly known as: TYLENOL Take 650 mg by mouth every 6 (six) hours as needed for mild pain.   busPIRone 15 MG tablet Commonly known as: BUSPAR Take 1 tablet (15 mg total) by mouth 2 (two) times daily.   dimenhyDRINATE 50 MG tablet Commonly known as: DRAMAMINE Take 50 mg by mouth every 8 (eight) hours as needed for nausea or dizziness.   escitalopram 20 MG tablet Commonly known as: LEXAPRO Take 20 mg by mouth daily.   gabapentin 300 MG capsule Commonly known as: NEURONTIN Take 1 capsule (300 mg total) by mouth 3 (three) times daily.   HumaLOG KwikPen 100 UNIT/ML KwikPen Generic drug: insulin lispro Inject 23 Units into the skin in the morning, at noon, and at bedtime.   ibuprofen 200 MG tablet Commonly known as: ADVIL Take 400 mg by mouth every 6 (six) hours as needed for headache or moderate pain.   Ocrevus 300 MG/10ML injection Generic drug: ocrelizumab Inject 300 mg into the vein once. Every six months.   ondansetron 4 MG disintegrating tablet Commonly known as: Zofran ODT Take 1 tablet (4 mg total) by mouth every 8 (eight) hours as needed. What changed: reasons to take this   phentermine 37.5 MG capsule Take 1 capsule (37.5 mg total) by mouth every  morning.   Toujeo Max SoloStar 300 UNIT/ML Solostar Pen Generic drug: insulin glargine (2 Unit Dial) Inject 40 Units into the skin 2 (two) times daily.        Disposition and follow-up:   Ms.Madalen L Piscopo was discharged from [facility] in Stable condition.  At the hospital follow up visit please address:  1. Multiple sclerosis: please follow up with Neurology on Ocrevus medicine  Type 1 DM: please start with 50 units of Toujeo BID for 3 days and switch back to normal dose of 40 units BID. Continue Humalog 23 units TID. Follow up with PC and Endocrinologist.   2.  Labs / imaging needed at time of follow-up: N/A  3.  Pending labs/ test needing follow-up: N/A  Follow-up Appointments:    Hospital Course by problem list: 1. Multiple sclerosis exacerbation 24 y.o. female was admitted for weakness, numbness of arms and legs, dizziness and left facial numbness. She was recently diagnosed with MS in march 2021 and was treated with 3 days of IV Solumedrol 1 g. Patient received 3 days of IV Solumedrol per Neurology recommendation. Patient is discharged and to follow up with Neurology outpatient on July 13.   Diabetes mellitus type 1 BG 241 on arrival. A1C was 10. Patient required tight control of blood glucose due to steroid administration. She was started on 45 Lantus units  BID and up to 50 units BID and sliding scale Novelog TID.   Right Fifth Metatarsal Fracture Patient had a fall on 02/04/2020 due to extremity weakness. Continue foot brace and follow up with Podiatry outpatient.  Discharge Vitals:   BP (!) 143/107 (BP Location: Left Arm)    Pulse 86    Temp 98.8 F (37.1 C) (Oral)    Resp 16    SpO2 97%   Pertinent Labs, Studies, and Procedures: . BMP Latest Ref Rng & Units 02/23/2020 02/22/2020 02/22/2020  Glucose 70 - 99 mg/dL 196(Q) 229(N) 989(Q)  BUN 6 - 20 mg/dL 15 10 9   Creatinine 0.44 - 1.00 mg/dL 1.19 4.17  Sodium 135 - 145 mmol/L 139 138 136  Potassium  3.5 - 5.1 mmol/L 4.2 3.9 4.6  Chloride 98 - 111 mmol/L 111 110 108  CO2 22 - 32 mmol/L 19(L) 17(L) 16(L)  Calcium 8.9 - 10.3 mg/dL 8.3(L) 8.3(L) 8.5(L)   CBC Latest Ref Rng & Units 02/23/2020 02/22/2020 02/21/2020  WBC 4.0 - 10.5 K/uL 12.0(H) 11.6(H) 13.8(H)  Hemoglobin 12.0 - 15.0 g/dL 11.9(L) 12.9 13.2  Hematocrit 36 - 46 % 37.9 42.0 42.4  Platelets 150 - 400 K/uL 351 321 387    CBG (last 3)  Recent Labs    02/23/20 0605 02/23/20 0803 02/23/20 1246  GLUCAP 241* 205* 277*    Discharge Instructions: Discharge Instructions     Call MD for:  difficulty breathing, headache or visual disturbances   Complete by: As directed    Diet - low sodium heart healthy   Complete by: As directed    Discharge instructions   Complete by: As directed    Thank you for letting 04/25/20 taking care of you during the hospital stay.  1. Please follow up with your PCP 2. Please follow up with your Neurologist  3. Please follow up with you Endocrinologist 4. Please give 50 units of Toujeo two times a day no more than three days. After that, resume regular dose 40 units of Toujeo two times day. Continue 23 units of Humalog three times a day. 5. Please follow up with outpatient OT.   Increase activity slowly   Complete by: As directed    Increase activity slowly   Complete by: As directed        Signed: [place], DO 02/23/2020, 3:13 PM   Pager: [phone]

## 2020-02-23 NOTE — TOC Transition Note (Signed)
Transition of Care [facility]) - CM/SW Discharge Note   Patient Details  Name: Danielle Baker MRN: [id number] Date of Birth: 1995-08-26  Transition of Care [facility]) CM/SW Contact:  Kermit Balo, RN Phone Number: 02/23/2020, 4:52 PM   Clinical Narrative:    Pt discharged home with spouse. Will f/u with outpatient therapy at [facility]. Orders faxed.  Pt has transport home.   Final next level of care: OP Rehab Barriers to Discharge: No Barriers Identified   Patient Goals and CMS Choice        Discharge Placement                       Discharge Plan and Services                                     Social Determinants of Health (SDOH) Interventions     Readmission Risk Interventions No flowsheet data found.

## 2020-02-23 NOTE — Consult Note (Signed)
   [facility] CM Inpatient Consult   02/23/2020  Danielle Baker 06-14-1996 [id number]    Patient is previously active [pending status] with [facility] Care Management for chronic disease management services.  Patient has been engaged by a [facility] Care Coordinator for the [facility] plan in the past.  Spoke with patient at the bedside, HIPAA verified.  Explained post hospital follow up and currently denies any issues but appreciative of follow up planned. Our community based plan of care has focused on disease management and community resource support.   Plan: Patient will receive a post hospital call and will be evaluated for assessments and disease process education with [facility] RN Care Coordinator.      For additional questions or referrals please contact:   Charlesetta Shanks, RN BSN CCM [facility]  [phone] business mobile phone Toll free office [phone]  Fax number: [phone] [email or web address]

## 2020-02-23 NOTE — Progress Notes (Signed)
Pt discharge education and instructions completed with pt and spouse at bedside, both voices understanding and denies any questions. Pt IV and telemetry removed. Pt discharge home with spouse to transport pt home. Pt transported off unit via wheelchair with belongings to the side. P.[employer]mo [employer] RN

## 2020-02-24 ENCOUNTER — Other Ambulatory Visit: Payer: Self-pay | Admitting: *Deleted

## 2020-02-24 NOTE — Patient Outreach (Signed)
[facility]) Care Management  02/24/2020  [name] [birth year] [id number]   Transition of care telephone call  Referral received:02/22/20 Initial outreach:02/24/20 Insurance: [facility]  Initial unsuccessful telephone call to patient's preferred number in order to complete transition of care assessment; no answer, left HIPAA compliant voicemail message requesting return call.   Objective: Per the electronic medical record, Danielle Baker  was hospitalized at [facility] 7/6-02/23/20 for Exacerbation of Multiple sclerosis, Bilateral arm numbness and left facial numbness, ketosis . Comorbidities include: Type 1 Diabetes, DKA, Hypertension , fall on 02/04/20 with right 5th metatarsal fracture, morbid obesity  He was discharged to home on 02/23/20 with referral to outpatient rehab at [facility] , no new  durable medical equipment per the discharge summary.   Plan: This RNCM will route unsuccessful outreach letter with [facility] Care Management pamphlet and 24 hour Nurse Advice Line Magnet to [employer] Care Management clinical pool to be mailed to patient's home address. This RNCM will attempt another outreach within 4 business days.   Egbert Garibaldi, RN, BSN  [facility] Care Management,Care Management Coordinator  [phone]- Mobile [phone]- Toll Free Main Office

## 2020-02-25 ENCOUNTER — Telehealth: Payer: No Typology Code available for payment source | Admitting: Nurse Practitioner

## 2020-02-25 DIAGNOSIS — N3000 Acute cystitis without hematuria: Secondary | ICD-10-CM | POA: Diagnosis not present

## 2020-02-25 MED ORDER — NITROFURANTOIN MONOHYD MACRO 100 MG PO CAPS
100.0000 mg | ORAL_CAPSULE | Freq: Two times a day (BID) | ORAL | 0 refills | Status: DC
Start: 2020-02-25 — End: 2020-04-12

## 2020-02-25 NOTE — Progress Notes (Signed)

## 2020-02-28 ENCOUNTER — Telehealth: Payer: Self-pay | Admitting: *Deleted

## 2020-02-28 NOTE — Telephone Encounter (Signed)
Pt here today for Ocrevus infusion. She is feeling better after being discharged from the hospital 02/23/20. She have aUTI per Freddi Starr, RN. On nitrofurantoin. Per Dr. Epimenio Foot, ok for her to infuse today. As long as she does not have fever. [name],RN confirmed pt has no active fever.

## 2020-02-29 ENCOUNTER — Other Ambulatory Visit: Payer: Self-pay | Admitting: *Deleted

## 2020-02-29 NOTE — Patient Outreach (Signed)
[facility]) Care Management  02/29/2020  Danielle Baker 1996/07/30 [id number]  Transition of care call Referral received: 02/22/20 Initial outreach attempt:02/24/20 Insurance: Focus Plan    2nd unsuccessful telephone call to patient's preferred contact number in order to complete post hospital discharge transition of care assessment , no answer left HIPAA compliant message requesting return call.    Objective:  Per the electronic medical record, Danielle Baker  was hospitalized at [facility] 7/6-02/23/20 for Exacerbation of Multiple sclerosis, Bilateral arm numbness and left facial numbness, ketosis . Comorbidities include: Type 1 Diabetes, DKA, Hypertension , fall on 02/04/20 with right 5th metatarsal fracture, morbid obesity  He was discharged to home on 02/23/20 with referral to outpatient rehab at [facility] , no new  durable medical equipment per the discharge summary.   will route unsuccessful outreach letter with [facility]    Plan Will plan 3rd call outreach in the next 4 business days.    Egbert Garibaldi, RN, BSN  [facility] Care Management,Care Management Coordinator  [phone]- Mobile [phone]- Toll Free Main Office

## 2020-03-05 ENCOUNTER — Other Ambulatory Visit: Payer: Self-pay | Admitting: *Deleted

## 2020-03-05 ENCOUNTER — Encounter: Payer: Self-pay | Admitting: *Deleted

## 2020-03-05 ENCOUNTER — Other Ambulatory Visit: Payer: Self-pay | Admitting: Neurology

## 2020-03-05 MED ORDER — LAMOTRIGINE 100 MG PO TABS: 100.0000 mg | ORAL_TABLET | Freq: Two times a day (BID) | ORAL | 5 refills | Status: DC

## 2020-03-05 NOTE — Patient Outreach (Addendum)
[facility]) Care Management  03/05/2020  Danielle Baker 03-12-1996 [id number]   Transition of care call/case closure   Referral received:02/22/20 Initial outreach:02/24/20 Insurance: [place] Focus   Subjective: 3rd attempt l successful telephone call to patient's preferred number in order to complete transition of care assessment; 2 HIPAA identifiers verified. Explained purpose of call and completed transition of care assessment.  Danielle Baker discussed feeling okay, she shared hoping to begin feeling better with receiving Ocevis treatments.She discussed that she continues to have numbness in arms.  She reports  tolerating diet,working on modifying carbohydrate intake,  denies bowel or bladder problems.  Spouse/sister are assisting with her  recovery. Danielle Baker discussed that her right foot is in a boot due to fracture of right metatarsal, she discussed having a fall, due to difficulty with balance and coordination she attributes to MS.  She discussed  ongoing health issue with Diabetes , she voice understanding of her A1c being at 10% she recalls multiple times she has been on steroids since her MS diagnosis. She is agreeable to referral to  one of the [place] chronic disease management programs. She reports monitoring blood sugars at home, not shared recent reading , but has appointment with endocrinologist scheduled.  She does not have the hospital indemnity. She  uses a [facility] outpatient pharmacy.   Objective:   Danielle Baker hospitalized at [facility] 7/6-02/23/20 for Exacerbation of Multiple sclerosis, Bilateral arm numbness and left facial numbness, ketosis .Comorbidities include: Type 1 Diabetes, DKA, Hypertension , fall on 02/04/20 with right 5th metatarsal fracture, morbid obesityHe was discharged to home on 02/23/20 with referral to outpatient rehab at [facility] , no newdurable medical equipment per the discharge summary.  Assessment:  Patient voices good  understanding of all discharge instructions.  See transition of care flowsheet for assessment details.   Plan:  Reviewed hospital discharge diagnosis of Multiple Sclerosis  and discharge treatment plan using hospital discharge instructions, assessing medication adherence, reviewing problems requiring provider notification, and discussing the importance of follow up with surgeon, primary care provider and/or specialists as directed. Emphasized attending all recommended follow up medical appointments.   Reviewed [place] healthy lifestyle program information to receive discounted premium for  2022   Step 1: Get  your annual physical  Step 2: Complete your health assessment  Step 3:Identify your current health status and complete the corresponding action step between January 1, and April 18, 2020.    Using Active Health Management Active[employer]ice View website,enrolled patient to  participate in [place]'s Active Health Management chronic disease management program.  Provided patient with contact number to Active health management program.   No ongoing care management needs identified so will close case to [facility] Care Management services . Patient was routed [facility] care management outreach letter at initial outreach call attempt on 02/24/20   Egbert Garibaldi, RN, BSN  [facility] Care Management,Care Management Coordinator  [phone]- Mobile [phone]- Toll Free Main Office

## 2020-03-06 ENCOUNTER — Telehealth: Payer: Self-pay

## 2020-03-06 ENCOUNTER — Other Ambulatory Visit: Payer: Self-pay | Admitting: Podiatry

## 2020-03-06 DIAGNOSIS — M722 Plantar fascial fibromatosis: Secondary | ICD-10-CM

## 2020-03-06 NOTE — Telephone Encounter (Signed)
Pt called and LVM at the nurse line stating there has been and increase of pain and swelling at her 5th met fx. Pt states she has kept her cam walker boot and ace wrapping it with no relief. Pt states she would like further recommendations or should she make an appt. To be re-evaluated. Please advice

## 2020-03-06 NOTE — Telephone Encounter (Signed)
Yes we can re-eval can she see me This week in [place]?

## 2020-03-09 ENCOUNTER — Ambulatory Visit (INDEPENDENT_AMBULATORY_CARE_PROVIDER_SITE_OTHER): Payer: No Typology Code available for payment source | Admitting: Podiatry

## 2020-03-09 ENCOUNTER — Ambulatory Visit (INDEPENDENT_AMBULATORY_CARE_PROVIDER_SITE_OTHER): Payer: No Typology Code available for payment source

## 2020-03-09 ENCOUNTER — Telehealth: Payer: Self-pay | Admitting: *Deleted

## 2020-03-09 ENCOUNTER — Other Ambulatory Visit: Payer: Self-pay

## 2020-03-09 ENCOUNTER — Encounter: Payer: Self-pay | Admitting: Podiatry

## 2020-03-09 DIAGNOSIS — R600 Localized edema: Secondary | ICD-10-CM

## 2020-03-09 DIAGNOSIS — S92351D Displaced fracture of fifth metatarsal bone, right foot, subsequent encounter for fracture with routine healing: Secondary | ICD-10-CM

## 2020-03-09 DIAGNOSIS — S90122D Contusion of left lesser toe(s) without damage to nail, subsequent encounter: Secondary | ICD-10-CM

## 2020-03-09 NOTE — Progress Notes (Signed)
  Subjective:  Patient ID: Danielle Baker, female    DOB: 1996/07/25,  MRN: [id number]  Chief Complaint  Patient presents with  . Fracture    the right foot is hurting and i am swelling and does look red    24 y.o. female presents with the above complaint. History confirmed with patient. States the pain has been worse denies new or increased activity. Has been compliant with boot.   Objective:  Physical Exam: warm, good capillary refill, no trophic changes or ulcerative lesions, normal DP and PT pulses and normal sensory exam. Right Foot: Pain at right 5th met no warmth or redness noted today  Radiographs: X-ray of the right foot: Fifth metatarsal shaft fracture with continued healing no worsenign. Assessment:   1. Closed displaced fracture of fifth metatarsal bone of right foot with routine healing, subsequent encounter    Plan:  Patient was evaluated and treated and all questions answered.  Fracture Right 5th Metatarsal -Repeat XRs taken today. Interval healing, no worsening of the fracture noted today. -Unna boot applied for reduction of edema -Order knee scooter for asstance in offloading  No follow-ups on file.

## 2020-03-09 NOTE — Telephone Encounter (Signed)
Faxed required form, clinical and demographics to [employer] and emailed.

## 2020-03-09 NOTE — Addendum Note (Signed)
Addended by: Alphia Kava D on: 03/09/2020 05:10 PM   Modules accepted: Orders

## 2020-03-19 ENCOUNTER — Telehealth: Payer: Self-pay | Admitting: Neurology

## 2020-03-19 MED FILL — busPIRone HCL 15 MG TABS: 15 | 30 days supply | Qty: 60 | Fill #3

## 2020-03-19 MED FILL — HUMALOG 100 UNITS/ML KWIKPE: 100 | 30 days supply | Qty: 27 | Fill #5

## 2020-03-19 MED FILL — PHENTERMINE 37.5 MG CAPSULE: 37.5 | 30 days supply | Qty: 30 | Fill #2

## 2020-03-19 NOTE — Telephone Encounter (Signed)
Pt called stating that her OT Therapist advised her to ask to have her leave extended. Please advise.

## 2020-03-19 NOTE — Telephone Encounter (Signed)
Gave addended paperwork back to Stanton Kidney in medical records to re-send for pt.

## 2020-03-19 NOTE — Telephone Encounter (Signed)
We can extend to 05/17/20

## 2020-03-19 NOTE — Telephone Encounter (Signed)
Dr. Epimenio Foot- are you ok with extending her leave? I reviewed her last form and you wrote her out until 04/02/2020 previously

## 2020-03-19 NOTE — Telephone Encounter (Signed)
Called pt. Relayed Dr. Epimenio Foot is going to extend leave to 05/17/20 for her. We will addend last paperwork filled out and will have Stanton Kidney in medical records resend updated forms. She verbalized understanding and appreciation.

## 2020-03-20 ENCOUNTER — Telehealth: Payer: No Typology Code available for payment source | Admitting: Family

## 2020-03-20 DIAGNOSIS — J019 Acute sinusitis, unspecified: Secondary | ICD-10-CM

## 2020-03-20 DIAGNOSIS — B9689 Other specified bacterial agents as the cause of diseases classified elsewhere: Secondary | ICD-10-CM

## 2020-03-20 MED ORDER — DOXYCYCLINE HYCLATE 100 MG PO TABS: 100.0000 mg | ORAL_TABLET | Freq: Two times a day (BID) | ORAL | 0 refills | Status: DC

## 2020-03-20 MED FILL — DOXYCYCLINE HYCLATE 100 MG: 100 | 10 days supply | Qty: 20 | Fill #0

## 2020-03-20 MED FILL — ESCITALOPRAM 20 MG TABLET: 20 | 30 days supply | Qty: 30 | Fill #0

## 2020-03-20 NOTE — Progress Notes (Signed)

## 2020-03-21 ENCOUNTER — Other Ambulatory Visit: Payer: Self-pay | Admitting: Neurology

## 2020-03-21 MED ORDER — OXCARBAZEPINE 300 MG PO TABS: ORAL_TABLET | ORAL | 5 refills | Status: DC

## 2020-03-26 ENCOUNTER — Ambulatory Visit (INDEPENDENT_AMBULATORY_CARE_PROVIDER_SITE_OTHER): Payer: No Typology Code available for payment source

## 2020-03-26 ENCOUNTER — Other Ambulatory Visit: Payer: Self-pay

## 2020-03-26 ENCOUNTER — Encounter: Payer: Self-pay | Admitting: Podiatry

## 2020-03-26 ENCOUNTER — Other Ambulatory Visit: Payer: Self-pay | Admitting: Podiatry

## 2020-03-26 ENCOUNTER — Ambulatory Visit (INDEPENDENT_AMBULATORY_CARE_PROVIDER_SITE_OTHER): Payer: No Typology Code available for payment source | Admitting: Podiatry

## 2020-03-26 DIAGNOSIS — S92351D Displaced fracture of fifth metatarsal bone, right foot, subsequent encounter for fracture with routine healing: Secondary | ICD-10-CM | POA: Diagnosis not present

## 2020-03-26 DIAGNOSIS — M79671 Pain in right foot: Secondary | ICD-10-CM

## 2020-03-26 NOTE — Progress Notes (Signed)
  Subjective:  Patient ID: Danielle Baker, female    DOB: [birth year],  MRN: [id number]  Chief Complaint  Patient presents with  . Fracture    F/U Rt 5th met fx Pt. states," it still hurts alittle or some, but nothing major any more; 4/10 constatn pain." - less swellign tx: boot, IBu and icing    24 y.o. female presents with the above complaint. History confirmed with patient.    Objective:  Physical Exam: warm, good capillary refill, no trophic changes or ulcerative lesions, normal DP and PT pulses and normal sensory exam. Right Foot: Pain at right 5th met   Radiographs: X-ray of the right foot: Fifth metatarsal shaft fracture with continued consolidation and healing.. Assessment:   1. Closed displaced fracture of fifth metatarsal bone of right foot with routine healing, subsequent encounter    Plan:  Patient was evaluated and treated and all questions answered.  Fracture Right 5th Metatarsal -Repeat XRs taken today. Interval healing -Advised to check on status of knee scooter as it was preivously placed -Pt would like to go back to work next week I think this is reasonable as long as she wears her boot.  No follow-ups on file.

## 2020-03-27 ENCOUNTER — Telehealth: Payer: No Typology Code available for payment source | Admitting: Physician Assistant

## 2020-03-27 DIAGNOSIS — N760 Acute vaginitis: Secondary | ICD-10-CM

## 2020-03-27 MED ORDER — FLUCONAZOLE 150 MG PO TABS
150.0000 mg | ORAL_TABLET | Freq: Once | ORAL | 0 refills | Status: AC
Start: 2020-03-27 — End: 2020-03-27

## 2020-03-27 NOTE — Progress Notes (Signed)

## 2020-04-03 ENCOUNTER — Telehealth: Payer: Self-pay | Admitting: Neurology

## 2020-04-03 NOTE — Telephone Encounter (Signed)
Called pt to further discuss. She is wondering if Dr. Epimenio Foot will fill out LTD for her. She keeps getting extensions to be out of work currently and feels this would be the best option for her at this point. 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 Has ongoing vertigo, pins/needles in legs, still gets "zaps: when she moves her head. She recently got Ocrevus but feels it has not helped. She was previously on lamotrigine but this caused a rash and switched to oxcarbazepine on 03/21/20. Has not noticed much benefit. She does not feel she can do current job given her ongoing sx. Advised I will send Dr. Epimenio Foot a message and will call back to let her know what he says.

## 2020-04-03 NOTE — Telephone Encounter (Signed)
Pt is asking for a call from RN to discuss if she thinks Dr Epimenio Foot will fill out a type of disability paperwork for her.  Please call

## 2020-04-03 NOTE — Telephone Encounter (Signed)
Called pt. Offered work in appt next Tuesday 04/10/20 at 2pm with Dr. Epimenio Foot. Pt declined. Per Dr. Epimenio Foot, ok to use NP slot. Scheduled for 04/12/20 at 1:30pm with Dr. Epimenio Foot. Asked that she check in by 1pm and to bring forms with her that are needing to be filled out. She verbalized understanding.

## 2020-04-03 NOTE — Telephone Encounter (Signed)
I can fill out but would need to have a current exam.   She is scheduled 9/30  if needs to be before that, we can try to see sooner

## 2020-04-05 ENCOUNTER — Encounter: Payer: Self-pay | Admitting: Podiatry

## 2020-04-05 ENCOUNTER — Telehealth: Payer: Self-pay | Admitting: Urology

## 2020-04-05 NOTE — Telephone Encounter (Signed)
PT went to work last night and foot is swollen and bruise where break is and pt is in a lot of pain. Wants to know if we can write a note taking her out of work till the next time she sees you. Please Advise.

## 2020-04-05 NOTE — Telephone Encounter (Signed)
Yes and let's move up her appointment and get next XR. Nex week is ok

## 2020-04-12 ENCOUNTER — Encounter: Payer: Self-pay | Admitting: Podiatry

## 2020-04-12 ENCOUNTER — Ambulatory Visit (INDEPENDENT_AMBULATORY_CARE_PROVIDER_SITE_OTHER): Payer: No Typology Code available for payment source | Admitting: Neurology

## 2020-04-12 ENCOUNTER — Other Ambulatory Visit: Payer: Self-pay | Admitting: Podiatry

## 2020-04-12 ENCOUNTER — Ambulatory Visit (INDEPENDENT_AMBULATORY_CARE_PROVIDER_SITE_OTHER): Payer: No Typology Code available for payment source

## 2020-04-12 ENCOUNTER — Ambulatory Visit (INDEPENDENT_AMBULATORY_CARE_PROVIDER_SITE_OTHER): Payer: No Typology Code available for payment source | Admitting: Podiatry

## 2020-04-12 ENCOUNTER — Other Ambulatory Visit: Payer: Self-pay

## 2020-04-12 ENCOUNTER — Encounter: Payer: Self-pay | Admitting: Neurology

## 2020-04-12 VITALS — BP 132/85 | HR 99 | Ht 64.0 in | Wt 267.5 lb

## 2020-04-12 DIAGNOSIS — S92351D Displaced fracture of fifth metatarsal bone, right foot, subsequent encounter for fracture with routine healing: Secondary | ICD-10-CM

## 2020-04-12 DIAGNOSIS — G35 Multiple sclerosis: Secondary | ICD-10-CM | POA: Diagnosis not present

## 2020-04-12 DIAGNOSIS — R200 Anesthesia of skin: Secondary | ICD-10-CM | POA: Diagnosis not present

## 2020-04-12 DIAGNOSIS — M79671 Pain in right foot: Secondary | ICD-10-CM

## 2020-04-12 DIAGNOSIS — R41840 Attention and concentration deficit: Secondary | ICD-10-CM

## 2020-04-12 DIAGNOSIS — R269 Unspecified abnormalities of gait and mobility: Secondary | ICD-10-CM

## 2020-04-12 DIAGNOSIS — R400 Somnolence: Secondary | ICD-10-CM | POA: Diagnosis not present

## 2020-04-12 DIAGNOSIS — R5383 Other fatigue: Secondary | ICD-10-CM

## 2020-04-12 MED ORDER — AMPHETAMINE-DEXTROAMPHETAMINE 10 MG PO TABS: 10.0000 mg | ORAL_TABLET | Freq: Two times a day (BID) | ORAL | 0 refills | Status: DC

## 2020-04-12 MED FILL — AMPHETAMINE SALTS 10 MG: 10 | 30 days supply | Qty: 60 | Fill #0

## 2020-04-12 MED FILL — HUMALOG 100 UNITS/ML KWIKPE: 100 | 30 days supply | Qty: 27 | Fill #6

## 2020-04-12 MED FILL — busPIRone HCL 15 MG TABS: 15 | 30 days supply | Qty: 60 | Fill #4

## 2020-04-12 NOTE — Progress Notes (Signed)
[facility]  PATIENT: Danielle Baker DOB: [birth year]  REFERRING DOCTOR OR PCP:   Abner Greenspan, MD (PCP) SOURCE: Patient, notes from both hospital admissions, imaging and laboratory reports, MRI images personally reviewed.  _________________________________   HISTORICAL  CHIEF COMPLAINT:  Chief Complaint  Patient presents with  . Follow-up    RM 13. Last seen 01/04/2020. She needs disability forms filled out. Here for updated visit to get this filled out.  . Multiple Sclerosis    On Ocrevus. Last: 02/27/20, next: 09/12/19.     HISTORY OF PRESENT ILLNESS: Update 04/12/2020: Since the last visit, she started Ocrevus.  She had mild itching but no rash.   Benadryl helped.      She is off balanced some with walking.  She continues to get Lhermitte signs and feels weaker when that occurs.  This makes going downstairs difficult.   She fell down the stairs in June 2021 breaking her right foot.  She notes mild weakness in limbs that is intermittent.    She gets numbness in both hands and she gets painful dysesthesias in her feet.   She is doing OT.    The numbness is worse in the left face but the arms are more symmetric.   She is right handed.  Gait is doing about the same.  She has stumbles but no falls.   She notes intermittent weakness.   She notes the Lhermitte sign is worse and seems to linger more.    She has a sensation like ants biting her skin.   Gabapentin had not  helped the dysesthetric pain much so she stopped.   She notes vertigo when she moves or when hot or everexerted.  Driving is more difficult when she needs to look around (like an on-ramp).     She has fatigue daily.   She has had more depression and anxiety.    She is noting a lot of sleepiness..   She sleeps well at night.  She notes reduced STM, verbal fluency and feels school is more difficult.  Phentermine has only helped her fatigue and attention mildly.    She was working as a Lawyer at [employer].   Due to  her numbness, balance issues, and fatigue she has not been able to return,.   Vit D was low and she is taking 5000 U daily (was on 50000 weekly)   MS History: She had the rapid onset of  left arm and flank numbness on 11/07/2019, and to a lesser extent in the right arm.  In retrospect she had some dysesthetic sensation in her trunk 2 weeks earlier.    She went to [facility] and was admitted for IV Solumedrol.   Two weeks later, she had left visual changes  and more numbness bilaterally but more on her right side and also had left facial numbness.   She has a Lhermitte sign into the right leg with the second episode last week.    She received 3 days IV Solu-Medrol and noted improvement but feels the Lhermitte sign and numbness are worse since then.   She started Ocrevus June/July 2021.     She has no FH of MS.    Data reviewed: I personally reviewed the MRI images from 11/07/2019 and 11/22/2019: MRI of the cervical and thoracic spine 11/07/2019 showed enhancing foci at C3C4 posteriorly to the left and T1 and non-enhancing foci centrally at C1-C2, T2 centrally and T3T4 towards the right.   Brain  MRI showed scattered foci in the MCP's, left cerebellar hemisphere, pons, left thalamus and periventricular and juxtacortical white matter.   Several foci in the hemispheres and in the left middle cerebellar peduncle enhanced after contrast.    MRI of the brain and orbits 11/22/2019 showed an additional enhancing lesion in the left posterior lateral pons.    Orbits were normal.    Labs showed negative HIV and NMO Ab.   Vit D was 11 (now being supplemented).   REVIEW OF SYSTEMS: Constitutional: No fevers, chills, sweats, or change in appetite.  She has fatigue. Eyes: No visual changes, double vision, eye pain Ear, nose and throat: No hearing loss, ear pain, nasal congestion, sore throat Cardiovascular: No chest pain, palpitations Respiratory: No shortness of breath at rest or with exertion.   No  wheezes GastrointestinaI: No nausea, vomiting, diarrhea, abdominal pain, fecal incontinence Genitourinary:  Urinary urgency and frequency. Musculoskeletal: No neck pain, back pain Integumentary: No rash, pruritus, skin lesions Neurological: as above Psychiatric: Anxiety and mild depression. Endocrine: She is a type I diabetic. Hematologic/Lymphatic: No anemia, purpura, petechiae. Allergic/Immunologic: No itchy/runny eyes, nasal congestion, recent allergic reactions, rashes  ALLERGIES: Allergies  Allergen Reactions  . Enalapril Other (See Comments)    "Weak and blacked out" couldn't stand up. Pt states it dropped her BP too low   . Morphine And Related Rash    HOME MEDICATIONS:  Current Outpatient Medications:  .  acetaminophen (TYLENOL) 325 MG tablet, Take 650 mg by mouth every 6 (six) hours as needed for mild pain., Disp: , Rfl:  .  busPIRone (BUSPAR) 15 MG tablet, Take 1 tablet (15 mg total) by mouth 2 (two) times daily., Disp: 180 tablet, Rfl: 1 .  dimenhyDRINATE (DRAMAMINE) 50 MG tablet, Take 50 mg by mouth every 8 (eight) hours as needed for nausea or dizziness., Disp: , Rfl:  .  escitalopram (LEXAPRO) 20 MG tablet, Take 20 mg by mouth daily., Disp: , Rfl:  .  fluconazole (DIFLUCAN) 150 MG tablet, Take 150 mg by mouth once., Disp: , Rfl:  .  gabapentin (NEURONTIN) 300 MG capsule, Take 1 capsule (300 mg total) by mouth 3 (three) times daily., Disp: 90 capsule, Rfl: 0 .  HUMALOG KWIKPEN 100 UNIT/ML KwikPen, Inject 23 Units into the skin in the morning, at noon, and at bedtime., Disp: , Rfl:  .  ibuprofen (ADVIL) 200 MG tablet, Take 400 mg by mouth every 6 (six) hours as needed for headache or moderate pain., Disp: , Rfl:  .  ocrelizumab (OCREVUS) 300 MG/10ML injection, Inject 300 mg into the vein once. Every six months., Disp: , Rfl:  .  ondansetron (ZOFRAN ODT) 4 MG disintegrating tablet, Take 1 tablet (4 mg total) by mouth every 8 (eight) hours as needed. (Patient taking  differently: Take 4 mg by mouth every 8 (eight) hours as needed for nausea or vomiting. ), Disp: 20 tablet, Rfl: 6 .  Oxcarbazepine (TRILEPTAL) 300 MG tablet, One p.o. every morning and two p.o. nightly., Disp: 90 tablet, Rfl: 5 .  phentermine 37.5 MG capsule, Take 1 capsule (37.5 mg total) by mouth every morning., Disp: 30 capsule, Rfl: 5 .  TOUJEO MAX SOLOSTAR 300 UNIT/ML Solostar Pen, Inject 40 Units into the skin 2 (two) times daily., Disp: 3 mL, Rfl: 0 .  amphetamine-dextroamphetamine (ADDERALL) 10 MG tablet, Take 1 tablet (10 mg total) by mouth 2 (two) times daily with a meal., Disp: 60 tablet, Rfl: 0  PAST MEDICAL HISTORY: Past Medical History:  Diagnosis  Date  . Heart murmur    as a child  . Hypoglycemia associated with diabetes (HCC)   . Multiple sclerosis (HCC)   . Obesity, morbid (HCC)   . Pancreatitis   . Tachycardia   . Type 1 diabetes mellitus not at goal [facility])    since age of 24 years old    PAST SURGICAL HISTORY: Past Surgical History:  Procedure Laterality Date  . CESAREAN SECTION N/A 03/17/2016   Procedure: CESAREAN SECTION;  Surgeon: Marlow Baars, MD;  Location: [facility] BIRTHING SUITES;  Service: Obstetrics;  Laterality: N/A;  REQUEST RNFA  . CESAREAN SECTION N/A 08/06/2018   Procedure: CESAREAN SECTION;  Surgeon: Marlow Baars, MD;  Location: [facility] BIRTHING SUITES;  Service: Obstetrics;  Laterality: N/A;  RNFA AVAILABLE  . CHOLECYSTECTOMY      FAMILY HISTORY: Family History  Problem Relation Age of Onset  . Thyroid disease Mother   . Obesity Mother   . Diabetes Father   . Obesity Father   . Obesity Sister   . Cancer Maternal Grandmother   . Obesity Paternal Grandfather     SOCIAL HISTORY:  Social History   Socioeconomic History  . Marital status: Married    Spouse name: Not on file  . Number of children: Not on file  . Years of education: Not on file  . Highest education level: Not on file  Occupational History  . Not on file  Tobacco Use  . Smoking  status: Never Smoker  . Smokeless tobacco: Never Used  Vaping Use  . Vaping Use: Never used  Substance and Sexual Activity  . Alcohol use: No    Alcohol/week: 0.0 standard drinks  . Drug use: No  . Sexual activity: Yes    Birth control/protection: None  Other Topics Concern  . Not on file  Social History Narrative   Right handed   Caffeine use: none   Newly married   [employer] - online   Works part time at a daycare   Works at a [employer] time   Social Determinants of [employer]:   . Difficulty of Paying Living Expenses: Not on file  Food Insecurity:   . Worried About Programme researcher, broadcasting/film/video in the Last Year: Not on file  . Ran Out of Food in the Last Year: Not on file  Transportation Needs:   . Lack of Transportation (Medical): Not on file  . Lack of Transportation (Non-Medical): Not on file  Physical Activity:   . Days of Exercise per Week: Not on file  . Minutes of Exercise per Session: Not on file  Stress:   . Feeling of Stress : Not on file  Social Connections:   . Frequency of Communication with Friends and Family: Not on file  . Frequency of Social Gatherings with Friends and Family: Not on file  . Attends Religious Services: Not on file  . Active Member of Clubs or Organizations: Not on file  . Attends Banker Meetings: Not on file  . Marital Status: Not on file  Intimate Partner Violence:   . Fear of Current or Ex-Partner: Not on file  . Emotionally Abused: Not on file  . Physically Abused: Not on file  . Sexually Abused: Not on file     PHYSICAL EXAM  Vitals:   04/12/20 1311  BP: 132/85  Pulse: 99  Weight: 267 lb 8 oz (121.3 kg)  Height: 5\' 4"  (1.626 m)    Body  mass index is 45.92 kg/m.  No exam data present   General: The patient is well-developed and well-nourished and in no acute distress  HEENT:  Head is [place]/AT.  Sclera are anicteric.     Skin: Extremities are without rash or   edema.   Neurologic Exam  Mental status: The patient is alert and oriented x 3 at the time of the examination.  Speech is normal.  Cranial nerves: Extraocular movements are full.   Facial symmetry is present. There is reduced left facial sensation to soft touch and temperature.    .Facial strength is normal.  Trapezius and sternocleidomastoid strength is normal. No dysarthria is noted.   No obvious hearing deficits are noted.  Motor:  Muscle bulk is normal.   Tone is normal. Strength is  5 / 5 proximally but reduced RAM left hand > right and mild reduced grip intrinsic hand muscles, left > right.   Sensory: Sensory testing shows reduced touch/temp  In arms.   Coordination: Cerebellar testing reveals good finger-nose-finger right and slightly reduced heel-to-shin bilaterally.  Gait and station: Station is normal.   Gait is wide and tandem is wide. Romberg is borderline positive.   Reflexes: Deep tendon reflexes are symmetric and normal bilaterally.        DIAGNOSTIC DATA (LABS, IMAGING, TESTING) - I reviewed patient records, labs, notes, testing and imaging myself where available.  Lab Results  Component Value Date   WBC 12.0 (H) 02/23/2020   HGB 11.9 (L) 02/23/2020   HCT 37.9 02/23/2020   MCV 88.6 02/23/2020   PLT 351 02/23/2020      Component Value Date/Time   NA 139 02/23/2020 0436   K 4.2 02/23/2020 0436   CL 111 02/23/2020 0436   CO2 19 (L) 02/23/2020 0436   GLUCOSE 232 (H) 02/23/2020 0436   BUN 15 02/23/2020 0436   CREATININE 0.58 02/23/2020 0436   CREATININE 0.62 03/22/2015 1210   CALCIUM 8.3 (L) 02/23/2020 0436   PROT 6.3 (L) 11/23/2019 0358   ALBUMIN 3.2 (L) 11/23/2019 0358   AST 73 (H) 11/23/2019 0358   ALT 58 (H) 11/23/2019 0358   ALKPHOS 88 11/23/2019 0358   BILITOT 0.5 11/23/2019 0358   GFRNONAA >60 02/23/2020 0436   GFRNONAA >89 03/22/2015 1210   GFRAA >60 02/23/2020 0436   GFRAA >89 03/22/2015 1210   Lab Results  Component Value Date   CHOL 160  03/22/2015   HDL 35.30 (L) 03/22/2015   LDLCALC 95 03/22/2015   TRIG 151.0 (H) 03/22/2015   CHOLHDL 5 03/22/2015   Lab Results  Component Value Date   HGBA1C 10.0 (H) 02/21/2020   Lab Results  Component Value Date   VITAMINB12 229 11/07/2019   Lab Results  Component Value Date   TSH 2.716 11/07/2019       ASSESSMENT AND PLAN  Multiple sclerosis (HCC)  Numbness  Gait disturbance  Sleepiness  Obesity, morbid (HCC)  Attention deficit  Other fatigue  1.   Continue Ocrevus.   Check labs at time of next visit.   Check MRI in December to rdetermine if subclinical activity and consider different DMT if present 2.   Extend leave of absence to 08/17/2020  3.   Adderall for weight loss, ADD and sleepiness/fatigue 4.   Stay active.  Try to exercise and continue OT 5.   rtc 4 months, sooner if new or worsening neurologic function  45-minute office visit with the majority of the time spent face-to-face for history and  physical, discussion/counseling and decision-making.  Additional time with record review and documentation.  Tammee Thielke A. Epimenio Foot, MD, [facility] 04/12/2020, 2:31 PM Certified in Neurology, Clinical Neurophysiology, Sleep Medicine and Neuroimaging  [facility] [address] [place], [place] [address] [phone]

## 2020-04-12 NOTE — Telephone Encounter (Signed)
Gave completed/signed forms back to Stanton Kidney in medical records to process for pt.

## 2020-04-13 MED FILL — ESCITALOPRAM 20 MG TABLET: 20 | 30 days supply | Qty: 30 | Fill #1

## 2020-04-16 ENCOUNTER — Other Ambulatory Visit: Payer: Self-pay | Admitting: Podiatry

## 2020-04-16 DIAGNOSIS — S92351D Displaced fracture of fifth metatarsal bone, right foot, subsequent encounter for fracture with routine healing: Secondary | ICD-10-CM

## 2020-04-19 ENCOUNTER — Ambulatory Visit: Payer: No Typology Code available for payment source | Admitting: Occupational Therapy

## 2020-04-19 ENCOUNTER — Ambulatory Visit: Payer: No Typology Code available for payment source | Admitting: Podiatry

## 2020-04-19 MED FILL — TRESIBA FLEXTOUCH 200 UNITS: 200 | 90 days supply | Qty: 36 | Fill #0

## 2020-04-19 NOTE — Progress Notes (Signed)
Subjective:  Patient ID: Danielle Baker, female    DOB: August 21, 1995,  MRN: [id number]  Chief Complaint  Patient presents with   Foot Injury    F/U Rt 5th met fx Pt states," doing better, it used tohurt so bad and very swollen when I went back to work."; Tx: scooter, boot and icing    24 y.o. female presents with the above complaint. History confirmed with patient.    Objective:  Physical Exam: warm, good capillary refill, no trophic changes or ulcerative lesions, normal DP and PT pulses and normal sensory exam. Right Foot: Pain at right 5th met   Radiographs: X-ray of the right foot: Fifth metatarsal shaft fracture with continued consolidation but incomplete bridging Assessment:   1. Closed displaced fracture of fifth metatarsal bone of right foot with routine healing, subsequent encounter    Plan:  Patient was evaluated and treated and all questions answered.  Fracture Right 5th Metatarsal -Repeat XRs taken today. Interval but incomplete healing -Continue WBAT in boot today. -Pt could not tolerate going back to work. Will keep out for the time being,  No follow-ups on file.

## 2020-05-03 ENCOUNTER — Other Ambulatory Visit: Payer: Self-pay

## 2020-05-03 ENCOUNTER — Encounter: Payer: Self-pay | Admitting: Occupational Therapy

## 2020-05-03 ENCOUNTER — Ambulatory Visit: Payer: No Typology Code available for payment source | Attending: Family Medicine | Admitting: Occupational Therapy

## 2020-05-03 DIAGNOSIS — R209 Unspecified disturbances of skin sensation: Secondary | ICD-10-CM | POA: Diagnosis present

## 2020-05-03 DIAGNOSIS — R278 Other lack of coordination: Secondary | ICD-10-CM | POA: Diagnosis present

## 2020-05-03 DIAGNOSIS — M6281 Muscle weakness (generalized): Secondary | ICD-10-CM | POA: Diagnosis present

## 2020-05-03 DIAGNOSIS — R208 Other disturbances of skin sensation: Secondary | ICD-10-CM

## 2020-05-03 NOTE — Therapy (Signed)
[facility] [address] [place], [place], [address] Phone: [phone]   Fax:  [phone]  Occupational Therapy Evaluation  Patient Details  Name: Danielle Baker MRN: [id number] Date of Birth: 1996-04-10 Referring Provider (OT): Dr. Yetta Flock   Encounter Date: 05/03/2020   OT End of Session - 05/03/20 1526    Visit Number 1    Number of Visits 6    Date for OT Re-Evaluation 06/14/20    Authorization Type cone focus    OT Start Time 1403    OT Stop Time 1439    OT Time Calculation (min) 36 min    Activity Tolerance Patient tolerated treatment well    Behavior During Therapy [facility] for tasks assessed/performed           Past Medical History:  Diagnosis Date  . Heart murmur    as a child  . Hypoglycemia associated with diabetes (HCC)   . Multiple sclerosis (HCC)   . Obesity, morbid (HCC)   . Pancreatitis   . Tachycardia   . Type 1 diabetes mellitus not at goal [facility])    since age of 24 years old    Past Surgical History:  Procedure Laterality Date  . CESAREAN SECTION N/A 03/17/2016   Procedure: CESAREAN SECTION;  Surgeon: Marlow Baars, MD;  Location: [facility] BIRTHING SUITES;  Service: Obstetrics;  Laterality: N/A;  REQUEST RNFA  . CESAREAN SECTION N/A 08/06/2018   Procedure: CESAREAN SECTION;  Surgeon: Marlow Baars, MD;  Location: [facility] BIRTHING SUITES;  Service: Obstetrics;  Laterality: N/A;  RNFA AVAILABLE  . CHOLECYSTECTOMY      There were no vitals filed for this visit.   Subjective Assessment - 05/03/20 1408    Subjective  Deneis pain, just numbness    Patient Stated Goals to be able to be successful in school    Currently in Pain? No/denies             [facility] OT Assessment - 05/03/20 1409      Assessment   Medical Diagnosis MS    Referring Provider (OT) Dr. Yetta Flock    Onset Date/Surgical Date 03/19/20    Hand Dominance Right      Precautions   Precautions Fall      Balance Screen   Has the  patient fallen in the past 6 months Yes    How many times? 1    Has the patient had a decrease in activity level because of a fear of falling?  Yes    Is the patient reluctant to leave their home because of a fear of falling?  No      Home  Environment   Family/patient expects to be discharged to: Private residence    Lives With Spouse   children     Prior Function   Level of Independence Independent    Vocation Full time employment   currently on leave, pt in nursing school   Vocation Requirements CNA cone    Leisure Pt has children who are 4, and 20 months, she is in nursing school.      ADL   ADL comments Pt is modified I with all basic ADLs, pt has shower chain that she uses only prn.       IADL   Shopping --   has groceries delivered   [employer] light daily tasks such as dishwashing, bed making   Family helps prn   Meal Prep Plans, prepares and serves adequate meals  independently   uses crockpot    Medication Management Is responsible for taking medication in correct dosages at correct time    Financial Management Manages financial matters independently (budgets, writes checks, pays rent, bills goes to bank), collects and keeps track of income      Mobility   Mobility Status Independent;History of falls      Written Expression   Dominant Hand Right    Handwriting 100% legible      Vision Assessment   Vision Assessment Vision not tested      Cognition   Overall Cognitive Status Impaired/Different from baseline    Area of Impairment Memory;Attention    Attention Selective    Selective Attention Appears intact    Memory Impaired    Memory Impairment Decreased short term memory    Awareness Appears intact    Cognition Comments Pt reports delayed processing      Sensation   Light Touch Impaired by gross assessment;Impaired Detail    Light Touch Impaired Details Impaired RUE;Impaired LUE    Hot/Cold Impaired Detail    Hot/Cold Impaired Details  Impaired RUE;Impaired LUE      Coordination   Fine Motor Movements are Fluid and Coordinated No    9 Hole Peg Test Right;Left    Right 9 Hole Peg Test 26.82    Left 9 Hole Peg Test 41.97    Coordination impaired for LUE      ROM / Strength   AROM / PROM / Strength AROM;Strength      AROM   Overall AROM  Within functional limits for tasks performed      Strength   Overall Strength Deficits    Overall Strength Comments RUE grossly 4/5, LUE grossly 3+/5      Hand Function   Right Hand Grip (lbs) 24.9    Left Hand Grip (lbs) 10.8                                OT Long Term Goals - 05/03/20 1452      OT LONG TERM GOAL #1   Title I with HEP for coordination, hand strength and UE strength-06/07/20    Time 5    Period Weeks    Status New    Target Date 06/07/20      OT LONG TERM GOAL #2   Title Pt will verbalize understanding of memory compensation and cognitive  strategies for increased ease with IADLS and schoolwork    Time 5    Period Weeks    Status New      OT LONG TERM GOAL #3   Title Pt will increase bilateral grip strength by 5 lbs for increased functional use during ADLs.    Time 5    Period Weeks    Status New      OT LONG TERM GOAL #4   Title Pt will verbalize understanding of energy conservation strategies and sensory precautions.    Time 5    Period Weeks    Status New                 Plan - 05/03/20 1523    Clinical Impression Statement Pt is 24 yo female with MS (diagnosed earlier this year) and type 1 DM.  She presented to the ED with bil weakness in upper and lower extremities. Pt was hospitalized 02/21/20 with MS exacerbation and has been started on steriods.  Pt  with recent fall with fractured R metatarsal 6/19, she is WBAT. Pt has 2 small children and she is in nursing school. Pt  presents with the following deficits: decreased strength, decreased coordination, sensory deficits. Pt can benefit from skilled occupational  therapy in order to address these deficits to maximize pt's safety and I with ADLS/IADls.    OT Occupational Profile and History Detailed Assessment- Review of Records and additional review of physical, cognitive, psychosocial history related to current functional performance    Occupational performance deficits (Please refer to evaluation for details): ADL's;IADL's;Work;Education;Play;Social Participation;Leisure    Body Structure / Function / Physical Skills ADL;UE functional use;Balance;Flexibility;FMC;ROM;Gait;GMC;Coordination;Decreased knowledge of precautions;Decreased knowledge of use of DME;IADL;Strength;Dexterity;Mobility;Endurance    Cognitive Skills Attention;Memory    Rehab Potential Good    Comorbidities Affecting Occupational Performance: May have comorbidities impacting occupational performance    Modification or Assistance to Complete Evaluation  No modification of tasks or assist necessary to complete eval    OT Frequency 1x / week    OT Duration --   5 weeks plus eval   OT Treatment/Interventions Self-care/ADL training;Patient/family education;Paraffin;Passive range of motion;Balance training;Cryotherapy;Fluidtherapy;Splinting;Coping strategies training;Functional Mobility Training;Manual Therapy;Therapeutic activities;Therapeutic exercise;Moist Heat;Neuromuscular education    Plan coordination HEP, light theraband HEP, memory strategies.           Patient will benefit from skilled therapeutic intervention in order to improve the following deficits and impairments:   Body Structure / Function / Physical Skills: ADL, UE functional use, Balance, Flexibility, FMC, ROM, Gait, GMC, Coordination, Decreased knowledge of precautions, Decreased knowledge of use of DME, IADL, Strength, Dexterity, Mobility, Endurance Cognitive Skills: Attention, Memory     Visit Diagnosis: Other lack of coordination  Muscle weakness (generalized)  Other disturbances of skin sensation    Problem  List Patient Active Problem List   Diagnosis Date Noted  . Attention deficit 04/12/2020  . Other fatigue 04/12/2020  . Ketosis (HCC) 02/22/2020  . Exacerbation of multiple sclerosis (HCC) 02/21/2020  . Sleepiness 01/04/2020  . High risk medication use 11/28/2019  . Numbness 11/28/2019  . Gait disturbance 11/28/2019  . Urinary urgency 11/28/2019  . Vitamin D deficiency 11/28/2019  . Multiple sclerosis (HCC) 11/07/2019  . History of cesarean delivery 08/06/2018  . Palpitations 03/30/2018  . Maternal varicella, non-immune 01/04/2018  . Pancreatitis 05/21/2016  . Diabetes mellitus complicating pregnancy in third trimester, antepartum 03/18/2016  . DKA (diabetic ketoacidoses) (HCC) 08/31/2013  . Childhood obesity 07/08/2012  . Obesity, morbid (HCC)   . Tachycardia   . Hypertension associated with diabetes (HCC)   . Type 1 diabetes mellitus without complication (HCC) 12/16/2010    Tamaj Jurgens 05/03/2020, 3:27 PM Keene Breath, OTR/L [phone] Phone: [phone] 3:28 PM 05/03/20 [facility] [address] [place], [place], [address] Phone: [phone]   Fax:  [phone]  Name: Danielle Baker MRN: [id number] Date of Birth: Apr 14, 1996

## 2020-05-03 NOTE — Patient Instructions (Signed)
1. Grip Strengthening (Resistive Putty)   Squeeze putty using thumb and all fingers. Repeat _20___ times. Do __2__ sessions per day.   2. Roll putty into tube on table and pinch between each finger and thumb x 10 reps each. (can do ring and small finger together)     Copyright  VHI. All rights reserved.   

## 2020-05-10 ENCOUNTER — Ambulatory Visit: Payer: No Typology Code available for payment source | Admitting: Podiatry

## 2020-05-10 MED FILL — HUMALOG 100 UNITS/ML KWIKPE: 100 | 30 days supply | Qty: 27 | Fill #7

## 2020-05-11 ENCOUNTER — Other Ambulatory Visit: Payer: Self-pay

## 2020-05-11 ENCOUNTER — Ambulatory Visit (INDEPENDENT_AMBULATORY_CARE_PROVIDER_SITE_OTHER): Payer: No Typology Code available for payment source

## 2020-05-11 ENCOUNTER — Other Ambulatory Visit: Payer: Self-pay | Admitting: Neurology

## 2020-05-11 ENCOUNTER — Encounter: Payer: Self-pay | Admitting: Podiatry

## 2020-05-11 ENCOUNTER — Ambulatory Visit (INDEPENDENT_AMBULATORY_CARE_PROVIDER_SITE_OTHER): Payer: No Typology Code available for payment source | Admitting: Podiatry

## 2020-05-11 DIAGNOSIS — S92351D Displaced fracture of fifth metatarsal bone, right foot, subsequent encounter for fracture with routine healing: Secondary | ICD-10-CM

## 2020-05-11 MED FILL — busPIRone HCL 15 MG TABS: 15 | 30 days supply | Qty: 60 | Fill #5

## 2020-05-11 MED FILL — ESCITALOPRAM 20 MG TABLET: 20 | 30 days supply | Qty: 30 | Fill #1

## 2020-05-11 NOTE — Progress Notes (Signed)
  Subjective:  Patient ID: Danielle Baker, female    DOB: 01/02/1996,  MRN: [id number]  No chief complaint on file.  24 y.o. female presents for follow-up. States her pain is improved but still present as well as with occasional swelling.  Objective:  Physical Exam: warm, good capillary refill, no trophic changes or ulcerative lesions, normal DP and PT pulses and normal sensory exam. Right Foot: Pain at right 5th met   Radiographs: X-ray of the right foot: Fifth metatarsal shaft fracture with near complete bridging. Assessment:   1. Closed displaced fracture of fifth metatarsal bone of right foot with routine healing, subsequent encounter    Plan:  Patient was evaluated and treated and all questions answered.  Fracture Right 5th Metatarsal -Repeat XRs taken today. Almost complete healing. -Transition to Trilock brace. Brace dispensed. -I think she is not ready to return to work just yet. Will allow more time for the fracture to heal and mature.   No follow-ups on file.

## 2020-05-14 ENCOUNTER — Encounter: Payer: Self-pay | Admitting: Podiatry

## 2020-05-14 MED ORDER — AMPHETAMINE-DEXTROAMPHETAMINE 10 MG PO TABS: 10.0000 mg | ORAL_TABLET | Freq: Two times a day (BID) | ORAL | 0 refills | Status: DC

## 2020-05-14 MED FILL — AMPHETAMINE SALTS 10 MG: 10 | 30 days supply | Qty: 60 | Fill #0

## 2020-05-14 NOTE — Telephone Encounter (Signed)
I just filled your prescription for Dr.Sater, who is out today.

## 2020-05-17 ENCOUNTER — Ambulatory Visit: Payer: No Typology Code available for payment source | Admitting: Neurology

## 2020-05-23 ENCOUNTER — Encounter: Payer: Self-pay | Admitting: Emergency Medicine

## 2020-05-23 ENCOUNTER — Ambulatory Visit (INDEPENDENT_AMBULATORY_CARE_PROVIDER_SITE_OTHER): Payer: No Typology Code available for payment source

## 2020-05-23 ENCOUNTER — Ambulatory Visit
Admission: EM | Admit: 2020-05-23 | Discharge: 2020-05-23 | Disposition: A | Discharge status: Home / Self Care | Payer: No Typology Code available for payment source | Attending: Emergency Medicine | Admitting: Emergency Medicine

## 2020-05-23 ENCOUNTER — Other Ambulatory Visit: Payer: Self-pay

## 2020-05-23 ENCOUNTER — Ambulatory Visit: Payer: No Typology Code available for payment source | Admitting: Occupational Therapy

## 2020-05-23 DIAGNOSIS — M533 Sacrococcygeal disorders, not elsewhere classified: Secondary | ICD-10-CM

## 2020-05-23 MED ORDER — TRAMADOL HCL 50 MG PO TABS: 50.0000 mg | ORAL_TABLET | Freq: Four times a day (QID) | ORAL | 0 refills | Status: DC | PRN

## 2020-05-23 MED ORDER — IBUPROFEN 800 MG PO TABS
800.0000 mg | ORAL_TABLET | Freq: Three times a day (TID) | ORAL | 0 refills | Status: DC
Start: 2020-05-23 — End: 2020-10-29

## 2020-05-23 NOTE — Discharge Instructions (Signed)
No fracture seen on imaging Likely bruised Use anti-inflammatories for pain/swelling. You may take up to 800 mg Ibuprofen every 8 hours with food. You may supplement Ibuprofen with Tylenol [phone] every 8 hours.  Tramadol for severe pain Ice Donut/pregnancy pillow for comfort Please go to the emergency room if you are developing loss of control of bowel/bladder, numbness or tingling in groin or weakness in legs

## 2020-05-23 NOTE — ED Triage Notes (Signed)
Pt here with lower back pain after fall today landing on buttocks; pt sts tailbone pain severe in nature

## 2020-05-23 NOTE — ED Provider Notes (Signed)
[facility]    CSN: [id number] Arrival date & time: 05/23/20  1343      History   Chief Complaint Chief Complaint  Patient presents with  . Fall  . Back Pain    HPI Danielle Baker is a 24 y.o. female history of DM type I, MS, presenting today for evaluation of buttock pain after fall.  Patient slipped and fell landing on her bottom earlier today.  Landed on grass/concrete.  Pain slightly more intense towards right side of gluteal area, but reports that majority of pain is at tailbone.  Pain with sitting.  Denies changes in urination/bowel movements since.  Denies numbness or tingling, denies saddle anesthesia.  HPI  Past Medical History:  Diagnosis Date  . Heart murmur    as a child  . Hypoglycemia associated with diabetes (HCC)   . Multiple sclerosis (HCC)   . Obesity, morbid (HCC)   . Pancreatitis   . Tachycardia   . Type 1 diabetes mellitus not at goal [facility])    since age of 24 years old    Patient Active Problem List   Diagnosis Date Noted  . Attention deficit 04/12/2020  . Other fatigue 04/12/2020  . Ketosis (HCC) 02/22/2020  . Exacerbation of multiple sclerosis (HCC) 02/21/2020  . Sleepiness 01/04/2020  . High risk medication use 11/28/2019  . Numbness 11/28/2019  . Gait disturbance 11/28/2019  . Urinary urgency 11/28/2019  . Vitamin D deficiency 11/28/2019  . Multiple sclerosis (HCC) 11/07/2019  . History of cesarean delivery 08/06/2018  . Palpitations 03/30/2018  . Maternal varicella, non-immune 01/04/2018  . Pancreatitis 05/21/2016  . Diabetes mellitus complicating pregnancy in third trimester, antepartum 03/18/2016  . DKA (diabetic ketoacidoses) 08/31/2013  . Childhood obesity 07/08/2012  . Obesity, morbid (HCC)   . Tachycardia   . Hypertension associated with diabetes (HCC)   . Type 1 diabetes mellitus without complication (HCC) 12/16/2010    Past Surgical History:  Procedure Laterality Date  . CESAREAN SECTION N/A 03/17/2016    Procedure: CESAREAN SECTION;  Surgeon: Marlow Baars, MD;  Location: [facility] BIRTHING SUITES;  Service: Obstetrics;  Laterality: N/A;  REQUEST RNFA  . CESAREAN SECTION N/A 08/06/2018   Procedure: CESAREAN SECTION;  Surgeon: Marlow Baars, MD;  Location: [facility] BIRTHING SUITES;  Service: Obstetrics;  Laterality: N/A;  RNFA AVAILABLE  . CHOLECYSTECTOMY      OB History    Gravida  2   Para  2   Term  2   Preterm      AB      Living  2     SAB      TAB      Ectopic      Multiple  0   Live Births  2            Home Medications    Prior to Admission medications   Medication Sig Start Date End Date Taking? Authorizing Provider  acetaminophen (TYLENOL) 325 MG tablet Take 650 mg by mouth every 6 (six) hours as needed for mild pain.    [provider]  amphetamine-dextroamphetamine (ADDERALL) 10 MG tablet TAKE 1 TABLET (10 MG TOTAL) BY MOUTH 2 (TWO) TIMES DAILY WITH A MEAL. 05/14/20   Levert Feinstein, MD  amphetamine-dextroamphetamine (ADDERALL) 10 MG tablet Take 1 tablet (10 mg total) by mouth 2 (two) times daily with a meal. 05/14/20   Dohmeier, Porfirio Mylar, MD  busPIRone (BUSPAR) 15 MG tablet Take 1 tablet (15 mg total) by mouth 2 (two)  times daily. 11/28/19   Sater, Pearletha Furl, MD  dimenhyDRINATE (DRAMAMINE) 50 MG tablet Take 50 mg by mouth every 8 (eight) hours as needed for nausea or dizziness.    [provider]  escitalopram (LEXAPRO) 20 MG tablet Take 20 mg by mouth daily. 10/11/19   [provider]  fluconazole (DIFLUCAN) 150 MG tablet Take 150 mg by mouth once. Patient not taking: Reported on 05/03/2020 03/27/20   [provider]  gabapentin (NEURONTIN) 300 MG capsule Take 1 capsule (300 mg total) by mouth 3 (three) times daily. Patient not taking: Reported on 05/03/2020 12/22/19   Sater, Pearletha Furl, MD  HUMALOG KWIKPEN 100 UNIT/ML KwikPen Inject 23 Units into the skin in the morning, at noon, and at bedtime. 10/06/19   [provider]  ibuprofen  (ADVIL) 800 MG tablet Take 1 tablet (800 mg total) by mouth 3 (three) times daily. 05/23/20   Hammond Obeirne C, PA-C  ocrelizumab (OCREVUS) 300 MG/10ML injection Inject 300 mg into the vein once. Every six months.    [provider]  ondansetron (ZOFRAN ODT) 4 MG disintegrating tablet Take 1 tablet (4 mg total) by mouth every 8 (eight) hours as needed. Patient taking differently: Take 4 mg by mouth every 8 (eight) hours as needed for nausea or vomiting.  02/16/20   Levert Feinstein, MD  Oxcarbazepine (TRILEPTAL) 300 MG tablet One p.o. every morning and two p.o. nightly. 03/21/20   Sater, Pearletha Furl, MD  phentermine 37.5 MG capsule Take 1 capsule (37.5 mg total) by mouth every morning. Patient not taking: Reported on 05/03/2020 01/04/20   Sater, Pearletha Furl, MD  TOUJEO MAX SOLOSTAR 300 UNIT/ML Solostar Pen Inject 40 Units into the skin 2 (two) times daily. 11/24/19   Katherine Roan, MD  traMADol (ULTRAM) 50 MG tablet Take 1 tablet (50 mg total) by mouth every 6 (six) hours as needed. 05/23/20   Aalaya Yadao, Junius Creamer, PA-C    Family History Family History  Problem Relation Age of Onset  . Thyroid disease Mother   . Obesity Mother   . Diabetes Father   . Obesity Father   . Obesity Sister   . Cancer Maternal Grandmother   . Obesity Paternal Grandfather     Social History Social History   Tobacco Use  . Smoking status: Never Smoker  . Smokeless tobacco: Never Used  Vaping Use  . Vaping Use: Never used  Substance Use Topics  . Alcohol use: No    Alcohol/week: 0.0 standard drinks  . Drug use: No     Allergies   Enalapril and Morphine and related   Review of Systems Review of Systems  Constitutional: Negative for activity change, chills, diaphoresis and fatigue.  HENT: Negative for ear pain, tinnitus and trouble swallowing.   Eyes: Negative for photophobia and visual disturbance.  Respiratory: Negative for cough, chest tightness and shortness of breath.   Cardiovascular: Negative for  chest pain and leg swelling.  Gastrointestinal: Negative for abdominal pain, blood in stool, nausea and vomiting.  Genitourinary: Negative for decreased urine volume and difficulty urinating.  Musculoskeletal: Positive for back pain and myalgias. Negative for arthralgias, gait problem, neck pain and neck stiffness.  Skin: Negative for color change and wound.  Neurological: Negative for dizziness, weakness, light-headedness, numbness and headaches.     Physical Exam Triage Vital Signs ED Triage Vitals  Enc Vitals Group     BP      Pulse      Resp  Temp      Temp src      SpO2      Weight      Height      Head Circumference      Peak Flow      Pain Score      Pain Loc      Pain Edu?      Excl. in GC?    No data found.  Updated Vital Signs BP 137/88 (BP Location: Left Arm)   Pulse (!) 124   Temp 98 F (36.7 C) (Oral)   Resp (!) 22   SpO2 96%   Visual Acuity Right Eye Distance:   Left Eye Distance:   Bilateral Distance:    Right Eye Near:   Left Eye Near:    Bilateral Near:     Physical Exam Vitals and nursing note reviewed.  Constitutional:      Appearance: She is well-developed.     Comments: Standing, appears uncomfortable  HENT:     Head: Normocephalic and atraumatic.     Nose: Nose normal.  Eyes:     Conjunctiva/sclera: Conjunctivae normal.  Cardiovascular:     Rate and Rhythm: Normal rate.  Pulmonary:     Effort: Pulmonary effort is normal. No respiratory distress.  Abdominal:     General: There is no distension.  Musculoskeletal:        General: Normal range of motion.     Cervical back: Neck supple.     Comments: Nontender to palpation along lower thoracic and lumbar spine midline, tenderness diffusely throughout sacral area extending into gluteal cleft, no overlying erythema or ecchymosis, mild tenderness extending into right gluteal area  Skin:    General: Skin is warm and dry.  Neurological:     Mental Status: She is alert and oriented  to person, place, and time.      UC Treatments / Results  Labs (all labs ordered are listed, but only abnormal results are displayed) Labs Reviewed - No data to display  EKG   Radiology DG Sacrum/Coccyx  Result Date: 05/23/2020 CLINICAL DATA:  Tailbone pain after fall. EXAM: SACRUM AND COCCYX - 2+ VIEW COMPARISON:  CT abdomen pelvis dated September 15, 2017. FINDINGS: There is no evidence of fracture or other focal bone lesions. IMPRESSION: Negative. Electronically Signed   By: Obie Dredge M.D.   On: 05/23/2020 15:25    Procedures Procedures (including critical care time)  Medications Ordered in UC Medications - No data to display  Initial Impression / Assessment and Plan / UC Course  I have reviewed the triage vital signs and the nursing notes.  Pertinent labs & imaging results that were available during my care of the patient were reviewed by me and considered in my medical decision making (see chart for details).     X-ray negative for acute fracture.  Suspect likely bruising.  Recommending anti-inflammatories for mild to moderate pain.  Given patient's discomfort did provide tramadol for a couple of days to help with severe pain.  Ice and supportive care.  No red flags for cauda equina.  Discussed strict return precautions. Patient verbalized understanding and is agreeable with plan.  Final Clinical Impressions(s) / UC Diagnoses   Final diagnoses:  Acute coccygeal pain     Discharge Instructions     No fracture seen on imaging Likely bruised Use anti-inflammatories for pain/swelling. You may take up to 800 mg Ibuprofen every 8 hours with food. You may supplement Ibuprofen with Tylenol  [phone] mg every 8 hours.  Tramadol for severe pain Ice Donut/pregnancy pillow for comfort Please go to the emergency room if you are developing loss of control of bowel/bladder, numbness or tingling in groin or weakness in legs    ED Prescriptions    Medication Sig  Dispense Auth. Provider   ibuprofen (ADVIL) 800 MG tablet Take 1 tablet (800 mg total) by mouth 3 (three) times daily. 21 tablet Renalda Locklin C, PA-C   traMADol (ULTRAM) 50 MG tablet Take 1 tablet (50 mg total) by mouth every 6 (six) hours as needed. 10 tablet Anay Walter, [place] C, PA-C     I have reviewed the PDMP during this encounter.   Lew Dawes, PA-C 05/23/20 1540

## 2020-05-28 ENCOUNTER — Ambulatory Visit: Payer: No Typology Code available for payment source | Admitting: Occupational Therapy

## 2020-06-05 ENCOUNTER — Ambulatory Visit: Payer: No Typology Code available for payment source | Admitting: Occupational Therapy

## 2020-06-06 NOTE — Telephone Encounter (Signed)
Called 630 pm  LM on voicemail

## 2020-06-07 ENCOUNTER — Other Ambulatory Visit: Payer: Self-pay | Admitting: Neurology

## 2020-06-07 ENCOUNTER — Telehealth: Payer: Self-pay | Admitting: Neurology

## 2020-06-07 DIAGNOSIS — R200 Anesthesia of skin: Secondary | ICD-10-CM

## 2020-06-07 DIAGNOSIS — G35 Multiple sclerosis: Secondary | ICD-10-CM

## 2020-06-07 DIAGNOSIS — R269 Unspecified abnormalities of gait and mobility: Secondary | ICD-10-CM

## 2020-06-07 MED ORDER — PREDNISONE 50 MG PO TABS
ORAL_TABLET | ORAL | 0 refills | Status: DC
Start: 2020-06-07 — End: 2020-08-08

## 2020-06-07 NOTE — Telephone Encounter (Addendum)
Called patient back to further discuss sx. She reports that she fell 4 days ago. Denies hitting head/significant injuries. Does have some bruises on her legs. She also fell 2-3 weeks ago and injured tailbone. Sx have worsened in the last two days. Usually only has hand numbness, but now numbness on whole left side. Having headaches, dizzy and facial numbness. Denies any signs/sx of infection. Denies starting any new meds. Fatigue usually helped by adderall but the last two days adderall has been ineffective. Advised I will discuss with Dr. Epimenio Foot and call her back.  Pt prefers not to go to the hospital. Requesting oral steroids or IV steroids at our office if absolutely necessary. I did make pt aware that we are closed on Friday's.  Last Ocrevus infusion:02/27/20, Next:09/11/20. Last seen 04/12/20. Next OV:08/08/20

## 2020-06-07 NOTE — Telephone Encounter (Signed)
Called and spoke with pt. She will come around 1:30pm for infusion. Gave intrafusion signed orders. Advised pt that Dr. Epimenio Foot will speak with her while she is here.

## 2020-06-07 NOTE — Telephone Encounter (Signed)
Pt is returning the call to Dr Epimenio Foot from yesterday evening, please call when available.

## 2020-06-07 NOTE — Telephone Encounter (Signed)
Tried calling pt twice, went to VM both time. Per Dr. Epimenio Foot, he would like her to come in today for IV steroids x1 day. He will stop in to speak with her while she is here. Per infusion suite, she can come 1:30pm or after.

## 2020-06-07 NOTE — Progress Notes (Signed)
She is reporting numbness since 2 days ago in the right flank and right face.

## 2020-06-11 ENCOUNTER — Other Ambulatory Visit: Payer: Self-pay | Admitting: Neurology

## 2020-06-11 ENCOUNTER — Telehealth: Payer: Self-pay | Admitting: Neurology

## 2020-06-11 MED ORDER — ALPRAZOLAM 0.5 MG PO TABS
ORAL_TABLET | ORAL | 0 refills | Status: DC
Start: 2020-06-11 — End: 2020-08-08

## 2020-06-11 MED FILL — HUMALOG 100 UNITS/ML KWIKPE: 100 | 30 days supply | Qty: 27 | Fill #8

## 2020-06-11 MED FILL — AMPHETAMINE SALTS 10 MG: 10 | 30 days supply | Qty: 60 | Fill #0

## 2020-06-11 MED FILL — busPIRone HCL 15 MG TABS: 15 | 90 days supply | Qty: 180 | Fill #0

## 2020-06-11 NOTE — Telephone Encounter (Signed)
LVM for pt to call back about scheduling mri  cone focus auth: [id number] (exp. 06/13/20 to 07/13/20)

## 2020-06-11 NOTE — Addendum Note (Signed)
Addended by: Arther Abbott on: 06/11/2020 11:56 AM   Modules accepted: Orders

## 2020-06-11 NOTE — Addendum Note (Signed)
Addended by: Despina Arias A on: 06/11/2020 12:59 PM   Modules accepted: Orders

## 2020-06-11 NOTE — Telephone Encounter (Signed)
no to the covid questions MR Brain w/wo contrast, MR Cervical spine w/wo contrast & MR Thoracic spine w/wo contrast Dr. Tressie Stalker Focus Berkley Harvey: [id number] (exp. 06/13/20 to 07/13/20). Patient is scheduled at [facility] for 06/20/20. Patient also informed me she is claustrophobic and would like something to help her. She is aware to have a driver.

## 2020-06-11 NOTE — Telephone Encounter (Signed)
Called pt. Should would like rx sent to [facility] on file. Advised I will send request to Dr. Epimenio Foot to send in script for her. She verbalized understanding.

## 2020-06-12 ENCOUNTER — Ambulatory Visit: Payer: No Typology Code available for payment source | Admitting: Occupational Therapy

## 2020-06-12 ENCOUNTER — Ambulatory Visit: Payer: No Typology Code available for payment source | Admitting: Podiatry

## 2020-06-12 MED FILL — ESCITALOPRAM 20 MG TABLET: 20 | 30 days supply | Qty: 30 | Fill #0

## 2020-06-18 ENCOUNTER — Encounter: Payer: Self-pay | Admitting: Podiatry

## 2020-06-18 ENCOUNTER — Other Ambulatory Visit: Payer: Self-pay

## 2020-06-18 ENCOUNTER — Ambulatory Visit (INDEPENDENT_AMBULATORY_CARE_PROVIDER_SITE_OTHER): Payer: No Typology Code available for payment source

## 2020-06-18 ENCOUNTER — Ambulatory Visit (INDEPENDENT_AMBULATORY_CARE_PROVIDER_SITE_OTHER): Payer: No Typology Code available for payment source | Admitting: Podiatry

## 2020-06-18 DIAGNOSIS — S92351D Displaced fracture of fifth metatarsal bone, right foot, subsequent encounter for fracture with routine healing: Secondary | ICD-10-CM | POA: Diagnosis not present

## 2020-06-18 DIAGNOSIS — R600 Localized edema: Secondary | ICD-10-CM

## 2020-06-18 NOTE — Progress Notes (Signed)
  Subjective:  Patient ID: Royetta Asal, female    DOB: 1996-06-05,  MRN: [id number]  Chief Complaint  Patient presents with  . Foot Problem    i am doing alot better on the right foot    24 y.o. female presents for follow-up. States she only has some occasional minor discomfort and doesn't even notice she has broken it.  Objective:  Physical Exam: warm, good capillary refill, no trophic changes or ulcerative lesions, normal DP and PT pulses and normal sensory exam. Right Foot: No pain at right 5th met   Radiographs: X-ray of the right foot: Fifth metatarsal shaft fracture with complete bridging. Assessment:   1. Closed displaced fracture of fifth metatarsal bone of right foot with routine healing, subsequent encounter   2. Localized edema    Plan:  Patient was evaluated and treated and all questions answered.  Fracture Right 5th Metatarsal -Repeat XRs taken today. Full healing noted -Ok to wear normal shoegear -Ok to return to work. -Will discharge with f/u as needed  No follow-ups on file.

## 2020-06-19 ENCOUNTER — Encounter: Payer: No Typology Code available for payment source | Admitting: Occupational Therapy

## 2020-06-20 ENCOUNTER — Other Ambulatory Visit: Payer: Self-pay

## 2020-06-20 ENCOUNTER — Ambulatory Visit: Payer: No Typology Code available for payment source

## 2020-06-20 ENCOUNTER — Other Ambulatory Visit: Payer: Self-pay | Admitting: Neurology

## 2020-06-20 DIAGNOSIS — R200 Anesthesia of skin: Secondary | ICD-10-CM

## 2020-06-20 DIAGNOSIS — G35 Multiple sclerosis: Secondary | ICD-10-CM

## 2020-06-20 DIAGNOSIS — R269 Unspecified abnormalities of gait and mobility: Secondary | ICD-10-CM

## 2020-06-21 ENCOUNTER — Telehealth: Payer: Self-pay | Admitting: *Deleted

## 2020-06-21 NOTE — Telephone Encounter (Signed)
-----   Message from Asa Lente, MD sent at 06/20/2020  7:52 PM EDT ----- Please let her know that I took a look at the MRI of the brain, cervical spine and thoracic spine and compared to the MRIs from March.  They look good.  There are no new MS lesions.  A couple of the MS lesions actually looks smaller on the current MRI.  Because she had poor venous access, the studies had to be done without contrast.  Since there are no new lesions we do not need to have her come back for contrast.

## 2020-06-22 ENCOUNTER — Telehealth: Payer: No Typology Code available for payment source | Admitting: Physician Assistant

## 2020-06-22 DIAGNOSIS — R42 Dizziness and giddiness: Secondary | ICD-10-CM

## 2020-06-22 DIAGNOSIS — G35 Multiple sclerosis: Secondary | ICD-10-CM

## 2020-06-22 NOTE — Progress Notes (Signed)
Based on what you shared with me, I feel your condition warrants further evaluation and I recommend that you be seen for a face to face office visit. Given your persistent dizziness, no improvement with dramamine or benadryl and history of MS you should have a face to face visit to ensure something more serious isn't going on.  You may go to a local urgent care or emergency department for evaluation.   NOTE: If you entered your credit card information for this eVisit, you will not be charged. You may see a "hold" on your card for the $35 but that hold will drop off and you will not have a charge processed.   If you are having a true medical emergency please call 911.      For an urgent face to face visit, [place] has five urgent care centers for your convenience:     [facility] at [facility] Directions [phone] [address] [place], [place] [address] . 10 am - 6pm Monday - Friday    [facility]) Get Driving Directions [phone] [address] [place], [place] [address] . 10 am to 8 pm Monday-Friday . 12 pm to 8 pm [facility]gent Care at [facility] Get Driving Directions [phone] [address] [place], [place] [address] . 8 am to 8 pm Monday-Friday . 9 am to 6 pm Saturday . 11 am to 6 pm Sunday     [facility] at [facility] Get Driving Directions  [phone] [address] [place], [place] [address] . 8 am to 8 pm Monday-Friday . 8 am to 4 pm [facility] at [facility] Directions [phone] [address] [place], [place] [address] . 12 pm to 6 pm Monday-Friday      Your e-visit answers were reviewed by a board certified advanced clinical practitioner to complete your personal care plan.  Thank you for using e-Visits.    Greater than 5 minutes, yet  less than 10 minutes of time have been spent researching, coordinating, and implementing care for this patient today

## 2020-07-10 ENCOUNTER — Other Ambulatory Visit: Payer: Self-pay | Admitting: Neurology

## 2020-07-10 MED FILL — ESCITALOPRAM 20 MG TABLET: 20 | 30 days supply | Qty: 30 | Fill #1

## 2020-07-10 MED FILL — HUMALOG 100 UNITS/ML KWIKPE: 100 | 30 days supply | Qty: 27 | Fill #9

## 2020-07-10 MED FILL — AMPHETAMINE SALTS 10 MG: 10 | 30 days supply | Qty: 60 | Fill #0

## 2020-08-08 ENCOUNTER — Encounter: Payer: Self-pay | Admitting: Neurology

## 2020-08-08 ENCOUNTER — Ambulatory Visit (INDEPENDENT_AMBULATORY_CARE_PROVIDER_SITE_OTHER): Payer: No Typology Code available for payment source | Admitting: Neurology

## 2020-08-08 VITALS — BP 118/80 | HR 97 | Ht 64.0 in | Wt 250.0 lb

## 2020-08-08 DIAGNOSIS — E109 Type 1 diabetes mellitus without complications: Secondary | ICD-10-CM

## 2020-08-08 DIAGNOSIS — R200 Anesthesia of skin: Secondary | ICD-10-CM | POA: Diagnosis not present

## 2020-08-08 DIAGNOSIS — R519 Headache, unspecified: Secondary | ICD-10-CM

## 2020-08-08 DIAGNOSIS — G35 Multiple sclerosis: Secondary | ICD-10-CM | POA: Diagnosis not present

## 2020-08-08 DIAGNOSIS — R41840 Attention and concentration deficit: Secondary | ICD-10-CM | POA: Diagnosis not present

## 2020-08-08 DIAGNOSIS — R269 Unspecified abnormalities of gait and mobility: Secondary | ICD-10-CM

## 2020-08-08 DIAGNOSIS — M542 Cervicalgia: Secondary | ICD-10-CM

## 2020-08-08 DIAGNOSIS — Z79899 Other long term (current) drug therapy: Secondary | ICD-10-CM

## 2020-08-08 MED ORDER — AMPHETAMINE-DEXTROAMPHETAMINE 10 MG PO TABS
10.0000 mg | ORAL_TABLET | Freq: Two times a day (BID) | ORAL | 0 refills | Status: DC
Start: 2020-08-08 — End: 2020-09-12

## 2020-08-08 NOTE — Progress Notes (Signed)
[facility]  PATIENT: Danielle Baker DOB: [birth year]  REFERRING DOCTOR OR PCP:   Abner Greenspan, MD (PCP) SOURCE: Patient, notes from both hospital admissions, imaging and laboratory reports, MRI images personally reviewed.  _________________________________   HISTORICAL  CHIEF COMPLAINT:  Chief Complaint  Patient presents with  . Follow-up    Rm 12, ms fu, pt states she is stable, c/o daily headaches, lower back pain, tingling in lower legs     HISTORY OF PRESENT ILLNESS:  Update 08/08/2020: Since the last visit, she started Ocrevus.  She had mild itching but no rash.   Benadryl helped.    Next infusion will be in January.  She has had more headaches.  Pain is occipital mostly and is daily.   She gets vertigo with it but not N/V or much photophobia.  Gait is about the same and she has had some falls.  On a good day without rest she can go > 500 feet.   Tingling in her back and legs with more activity.  She continues to get Lhermitte signs and feels weaker when that occurs.   Leg stregnth is about the same.  She has some weakness in both hands.  Numbness is worse on her left side, especiaally the hand.    She is right handed.    Oxcarbazepine helped the dysesthesia at first but now she is not sure. .  She notes vertigo when she moves or when hot or everexerted.    She has fatigue daily but is better, helped by Adderall .   She has stable depression and anxiety. She is on Lexapro.  She sleeps well at night now that she no longer takes naps.  She notes reduced STM, verbal fluency but these are better with Adderall 10 mg po twice a day/    She was working as a Lawyer at [employer].   Due to her numbness, balance issues, and fatigue she has not been able to return,.   Vit D was low and she is taking 5000 U daily (was on 50000 weekly)   MRI brain 06/20/2020 showed T2/FLAIR hyperintense foci in the pons, left middle cerebellar peduncle and in the hemispheres in a pattern and  configuration consistent with chronic demyelinating plaque associated with multiple sclerosis.  None of the foci appear to be acute.  Compared to the MRI from 11/07/2019, there do not appear to be any new lesions.  MRI cervical spine 06/20/2020 showed T2 hyperintense foci within the spinal cord posteriorly adjacent to C3-C4, to the left adjacent to T1 and adjacent to T2 consistent with chronic demyelinating plaque associated with multiple sclerosis.  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  There are also very minimal stable disc bulges at C3-C4 and C5-C6.  There is no nerve root compression.  MRI thoracic spine 06/20/2020 showed Foci within the spinal cord adjacent to T1 and T3-T4 as detailed above.  A third focus adjacent to T2 was noted on the cervical spine images but not clearly apparent on the thoracic spine images.  All 3 of these foci were present on MRIs from 11/07/2019 and all consistent with chronic demyelinating plaque associated with multiple sclerosis.  There are no new lesions.  MS History: She had the rapid onset of  left arm and flank numbness on 11/07/2019, and to a lesser extent in the right arm.  In retrospect  she had some dysesthetic sensation in her trunk 2 weeks earlier.    She went to [facility] and was admitted for IV Solumedrol.   Two weeks later, she had left visual changes  and more numbness bilaterally but more on her right side and also had left facial numbness.   She has a Lhermitte sign into the right leg with the second episode last week.    She received 3 days IV Solu-Medrol and noted improvement but feels the Lhermitte sign and numbness are worse since then.   She started Ocrevus June/July 2021.     She has no FH of MS.    Data reviewed: MRI brain 06/20/2020 showed T2/FLAIR hyperintense foci in the pons, left middle cerebellar peduncle and in the hemispheres in a pattern and  configuration consistent with chronic demyelinating plaque associated with multiple sclerosis.  None of the foci appear to be acute.  Compared to the MRI from 11/07/2019, there do not appear to be any new lesions.  MRI cervical spine 06/20/2020 showed T2 hyperintense foci within the spinal cord posteriorly adjacent to C3-C4, to the left adjacent to T1 and adjacent to T2 consistent with chronic demyelinating plaque associated with multiple sclerosis.  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  There are also very minimal stable disc bulges at C3-C4 and C5-C6.  There is no nerve root compression.  MRI thoracic spine 06/20/2020 showed Foci within the spinal cord adjacent to T1 and T3-T4 as detailed above.  A third focus adjacent to T2 was noted on the cervical spine images but not clearly apparent on the thoracic spine images.  All 3 of these foci were present on MRIs from 11/07/2019 and all consistent with chronic demyelinating plaque associated with multiple sclerosis.  There are no new lesions.  MRI of the cervical and thoracic spine 11/07/2019 showed enhancing foci at C3C4 posteriorly to the left and T1 and non-enhancing foci centrally at C1-C2, T2 centrally and T3T4 towards the right.   Brain MRI showed scattered foci in the MCP's, left cerebellar hemisphere, pons, left thalamus and periventricular and juxtacortical white matter.   Several foci in the hemispheres and in the left middle cerebellar peduncle enhanced after contrast.    MRI of the brain and orbits 11/22/2019 showed an additional enhancing lesion in the left posterior lateral pons.    Orbits were normal.    Labs showed negative HIV and NMO Ab.   Vit D was 11 (now being supplemented).   REVIEW OF SYSTEMS: Constitutional: No fevers, chills, sweats, or change in appetite.  She has fatigue. Eyes: No visual changes, double vision, eye pain Ear, nose and  throat: No hearing loss, ear pain, nasal congestion, sore throat Cardiovascular: No chest pain, palpitations Respiratory: No shortness of breath at rest or with exertion.   No wheezes GastrointestinaI: No nausea, vomiting, diarrhea, abdominal pain, fecal incontinence Genitourinary:  Urinary urgency and frequency. Musculoskeletal: No neck pain, back pain Integumentary: No rash, pruritus, skin lesions Neurological: as above Psychiatric: Anxiety and mild depression. Endocrine: She is a type I diabetic. Hematologic/Lymphatic: No anemia, purpura, petechiae. Allergic/Immunologic: No itchy/runny eyes, nasal congestion, recent allergic reactions, rashes  ALLERGIES: Allergies  Allergen Reactions  . Enalapril Other (See Comments)    "Weak and blacked out" couldn't stand up. Pt states it dropped her BP too low   . Morphine And Related Rash  HOME MEDICATIONS:  Current Outpatient Medications:  .  acetaminophen (TYLENOL) 325 MG tablet, Take 650 mg by mouth every 6 (six) hours as needed for mild pain., Disp: , Rfl:  .  busPIRone (BUSPAR) 15 MG tablet, TAKE 1 TABLET (15 MG TOTAL) BY MOUTH 2 (TWO) TIMES DAILY., Disp: 180 tablet, Rfl: 1 .  dimenhyDRINATE (DRAMAMINE) 50 MG tablet, Take 50 mg by mouth every 8 (eight) hours as needed for nausea or dizziness., Disp: , Rfl:  .  escitalopram (LEXAPRO) 20 MG tablet, Take 20 mg by mouth daily., Disp: , Rfl:  .  HUMALOG KWIKPEN 100 UNIT/ML KwikPen, Inject 23 Units into the skin in the morning, at noon, and at bedtime., Disp: , Rfl:  .  ibuprofen (ADVIL) 800 MG tablet, Take 1 tablet (800 mg total) by mouth 3 (three) times daily., Disp: 21 tablet, Rfl: 0 .  ocrelizumab (OCREVUS) 300 MG/10ML injection, Inject 300 mg into the vein once. Every six months., Disp: , Rfl:  .  ondansetron (ZOFRAN ODT) 4 MG disintegrating tablet, Take 1 tablet (4 mg total) by mouth every 8 (eight) hours as needed. (Patient taking differently: Take 4 mg by mouth every 8 (eight) hours  as needed for nausea or vomiting.), Disp: 20 tablet, Rfl: 6 .  Oxcarbazepine (TRILEPTAL) 300 MG tablet, One p.o. every morning and two p.o. nightly., Disp: 90 tablet, Rfl: 5 .  TOUJEO MAX SOLOSTAR 300 UNIT/ML Solostar Pen, Inject 40 Units into the skin 2 (two) times daily., Disp: 3 mL, Rfl: 0 .  amphetamine-dextroamphetamine (ADDERALL) 10 MG tablet, Take 1 tablet (10 mg total) by mouth 2 (two) times daily with a meal., Disp: 60 tablet, Rfl: 0  PAST MEDICAL HISTORY: Past Medical History:  Diagnosis Date  . Heart murmur    as a child  . Hypoglycemia associated with diabetes (HCC)   . Multiple sclerosis (HCC)   . Obesity, morbid (HCC)   . Pancreatitis   . Tachycardia   . Type 1 diabetes mellitus not at goal [facility])    since age of 24 years old    PAST SURGICAL HISTORY: Past Surgical History:  Procedure Laterality Date  . CESAREAN SECTION N/A 03/17/2016   Procedure: CESAREAN SECTION;  Surgeon: Marlow Baars, MD;  Location: [facility] BIRTHING SUITES;  Service: Obstetrics;  Laterality: N/A;  REQUEST RNFA  . CESAREAN SECTION N/A 08/06/2018   Procedure: CESAREAN SECTION;  Surgeon: Marlow Baars, MD;  Location: [facility] BIRTHING SUITES;  Service: Obstetrics;  Laterality: N/A;  RNFA AVAILABLE  . CHOLECYSTECTOMY      FAMILY HISTORY: Family History  Problem Relation Age of Onset  . Thyroid disease Mother   . Obesity Mother   . Diabetes Father   . Obesity Father   . Obesity Sister   . Cancer Maternal Grandmother   . Obesity Paternal Grandfather     SOCIAL HISTORY:  Social History   Socioeconomic History  . Marital status: Married    Spouse name: Not on file  . Number of children: Not on file  . Years of education: Not on file  . Highest education level: Not on file  Occupational History  . Not on file  Tobacco Use  . Smoking status: Never Smoker  . Smokeless tobacco: Never Used  Vaping Use  . Vaping Use: Never used  Substance and Sexual Activity  . Alcohol use: No    Alcohol/week: 0.0  standard drinks  . Drug use: No  . Sexual activity: Yes    Birth control/protection: None  Other  Topics Concern  . Not on file  Social History Narrative   Right handed   Caffeine use: none   Newly married   [employer] - online   Works part time at a daycare   Works at a [employer] time   Social Determinants of Corporate investment banker Strain: Not on [employer] Insecurity: Not on file  Transportation Needs: Not on file  Physical Activity: Not on file  Stress: Not on file  Social Connections: Not on file  Intimate Partner Violence: Not on file     PHYSICAL EXAM  Vitals:   08/08/20 1235  BP: 118/80  Pulse: 97  Weight: 250 lb (113.4 kg)  Height: 5\' 4"  (1.626 m)    Body mass index is 42.91 kg/m.  No exam data present   General: The patient is well-developed and well-nourished and in no acute distress  HEENT:  Head is Coleman/AT.  Sclera are anicteric.     Skin: Extremities are without rash or  edema.   Neurologic Exam  Mental status: The patient is alert and oriented x 3 at the time of the examination.  Speech is normal.  Cranial nerves: Extraocular movements are full.   Facial symmetry is present. There is reduced left facial sensation to soft touch and temperature.    .Facial strength is normal.  Trapezius and sternocleidomastoid strength is normal. No dysarthria is noted.   No obvious hearing deficits are noted.  Motor:  Muscle bulk is normal.   Tone is normal. Strength is  5 / 5 proximally but reduced RAM left hand > right and mild reduced grip intrinsic hand muscles, left > right.   Sensory: She has reduced touch and vibration sensation in both arms, worse on the left.  She has reduced touch and vibration sensation in the left leg relative to the right.  Coordination: Cerebellar testing reveals good finger-nose-finger right and slightly reduced heel-to-shin bilaterally.  Gait and station: Station is normal.   Gait is wide and tandem is wide.  Romberg is borderline positive.   Reflexes: Deep tendon reflexes are symmetric and normal bilaterally.        DIAGNOSTIC DATA (LABS, IMAGING, TESTING) - I reviewed patient records, labs, notes, testing and imaging myself where available.  Lab Results  Component Value Date   WBC 12.0 (H) 02/23/2020   HGB 11.9 (L) 02/23/2020   HCT 37.9 02/23/2020   MCV 88.6 02/23/2020   PLT 351 02/23/2020      Component Value Date/Time   NA 139 02/23/2020 0436   K 4.2 02/23/2020 0436   CL 111 02/23/2020 0436   CO2 19 (L) 02/23/2020 0436   GLUCOSE 232 (H) 02/23/2020 0436   BUN 15 02/23/2020 0436   CREATININE 0.58 02/23/2020 0436   CREATININE 0.62 03/22/2015 1210   CALCIUM 8.3 (L) 02/23/2020 0436   PROT 6.3 (L) 11/23/2019 0358   ALBUMIN 3.2 (L) 11/23/2019 0358   AST 73 (H) 11/23/2019 0358   ALT 58 (H) 11/23/2019 0358   ALKPHOS 88 11/23/2019 0358   BILITOT 0.5 11/23/2019 0358   GFRNONAA >60 02/23/2020 0436   GFRNONAA >89 03/22/2015 1210   GFRAA >60 02/23/2020 0436   GFRAA >89 03/22/2015 1210   Lab Results  Component Value Date   CHOL 160 03/22/2015   HDL 35.30 (L) 03/22/2015   LDLCALC 95 03/22/2015   TRIG 151.0 (H) 03/22/2015   CHOLHDL 5 03/22/2015   Lab Results  Component Value Date   HGBA1C  10.0 (H) 02/21/2020   Lab Results  Component Value Date   VITAMINB12 229 11/07/2019   Lab Results  Component Value Date   TSH 2.716 11/07/2019       ASSESSMENT AND PLAN  Multiple sclerosis (HCC) - Plan: IgG, IgA, IgM, CBC with Differential/Platelet  Gait disturbance  Numbness  Attention deficit  Type 1 diabetes mellitus without complication (HCC)  High risk medication use - Plan: IgG, IgA, IgM, CBC with Differential/Platelet  Neck pain  Occipital headache  1.   Continue Ocrevus.   Check IgG/IgM and CBC labs .   Check MRI in December to rdetermine if subclinical activity and consider different DMT if present 2.   She continues to be unable to work due to reduced gait  and left > right arm impairments.  .   She has applied for disability 3.   Adderall for weight loss, ADD and sleepiness/fatigue 4.   Trigger point injection bilateral splenius capitus and C3/C4 paraspinal muscles with 40 mg Depomedrol in [place] using sterile technique. 5.   Stop oxcarbazepine. rtc 4 months, sooner if new or worsening neurologic function  45-minute office visit with the majority of the time spent face-to-face for history and physical, discussion/counseling and decision-making.  Additional time with record review and documentation.   Janesha Brissette A. Epimenio Foot, MD, [facility] 08/08/2020, 1:36 PM Certified in Neurology, Clinical Neurophysiology, Sleep Medicine and Neuroimaging  [facility] [address], [address] [place], [place] [address] [phone]

## 2020-08-09 LAB — CBC WITH DIFFERENTIAL/PLATELET
Basophils Absolute: 0.1 10*3/uL (ref 0.0–0.2)
Basos: 1 %
EOS (ABSOLUTE): 0.3 10*3/uL (ref 0.0–0.4)
Eos: 3 %
Hematocrit: 44 % (ref 34.0–46.6)
Hemoglobin: 14.4 g/dL (ref 11.1–15.9)
Immature Grans (Abs): 0 10*3/uL (ref 0.0–0.1)
Immature Granulocytes: 0 %
Lymphocytes Absolute: 3 10*3/uL (ref 0.7–3.1)
Lymphs: 31 %
MCH: 27.3 pg (ref 26.6–33.0)
MCHC: 32.7 g/dL (ref 31.5–35.7)
MCV: 83 fL (ref 79–97)
Monocytes Absolute: 0.5 10*3/uL (ref 0.1–0.9)
Monocytes: 5 %
Neutrophils Absolute: 5.9 10*3/uL (ref 1.4–7.0)
Neutrophils: 60 %
Platelets: 336 10*3/uL (ref 150–450)
RBC: 5.28 x10E6/uL (ref 3.77–5.28)
RDW: 14.4 % (ref 11.7–15.4)
WBC: 9.7 10*3/uL (ref 3.4–10.8)

## 2020-08-09 LAB — IGG, IGA, IGM
IgA/Immunoglobulin A, Serum: 162 mg/dL (ref 87–352)
IgG (Immunoglobin G), Serum: 685 mg/dL (ref 586–1602)
IgM (Immunoglobulin M), Srm: 30 mg/dL (ref 26–217)

## 2020-08-13 ENCOUNTER — Ambulatory Visit: Payer: No Typology Code available for payment source | Admitting: Neurology

## 2020-08-14 MED FILL — HUMALOG 100 UNITS/ML KWIKPE: 100 | 30 days supply | Qty: 27 | Fill #10

## 2020-08-16 ENCOUNTER — Other Ambulatory Visit (HOSPITAL_COMMUNITY): Payer: Self-pay | Admitting: Physician Assistant

## 2020-08-16 MED FILL — ESCITALOPRAM 20 MG TABLET: 20 | 30 days supply | Qty: 30 | Fill #0

## 2020-08-30 ENCOUNTER — Telehealth: Payer: Self-pay | Admitting: *Deleted

## 2020-08-30 NOTE — Telephone Encounter (Signed)
Received fax from [employer] that PA Ocrevus approved 08/20/20-08/16/21. Auth# [id number]. Gave to [facility] for their records since pt infuses with them.

## 2020-09-12 ENCOUNTER — Other Ambulatory Visit: Payer: Self-pay

## 2020-09-12 MED ORDER — AMPHETAMINE-DEXTROAMPHETAMINE 10 MG PO TABS: 10.0000 mg | ORAL_TABLET | Freq: Two times a day (BID) | ORAL | 0 refills | Status: DC

## 2020-09-19 ENCOUNTER — Telehealth: Payer: Self-pay | Admitting: Neurology

## 2020-09-19 NOTE — Telephone Encounter (Signed)
Spoke with Dr. Epimenio Foot, we can fit her in for appt on 09/24/20 at 1:30pm to further discuss with pt

## 2020-09-19 NOTE — Telephone Encounter (Signed)
Pt. asks what does she need to do to get labs done & she wants to switch medication per conversation with doctor. Please advise.

## 2020-09-19 NOTE — Telephone Encounter (Signed)
Called pt and she accepted appt date/time. I scheduled and asked that she check in at 1pm for this. She verbalized understanding.

## 2020-09-24 ENCOUNTER — Telehealth: Payer: Self-pay | Admitting: *Deleted

## 2020-09-24 ENCOUNTER — Other Ambulatory Visit: Payer: Self-pay

## 2020-09-24 ENCOUNTER — Ambulatory Visit (INDEPENDENT_AMBULATORY_CARE_PROVIDER_SITE_OTHER): Payer: 59 | Admitting: Neurology

## 2020-09-24 ENCOUNTER — Encounter: Payer: Self-pay | Admitting: Neurology

## 2020-09-24 VITALS — BP 131/90 | HR 126 | Ht 64.0 in | Wt 243.0 lb

## 2020-09-24 DIAGNOSIS — Z79899 Other long term (current) drug therapy: Secondary | ICD-10-CM

## 2020-09-24 DIAGNOSIS — G35 Multiple sclerosis: Secondary | ICD-10-CM | POA: Diagnosis not present

## 2020-09-24 DIAGNOSIS — R200 Anesthesia of skin: Secondary | ICD-10-CM | POA: Diagnosis not present

## 2020-09-24 DIAGNOSIS — E109 Type 1 diabetes mellitus without complications: Secondary | ICD-10-CM

## 2020-09-24 DIAGNOSIS — R41840 Attention and concentration deficit: Secondary | ICD-10-CM

## 2020-09-24 DIAGNOSIS — R3915 Urgency of urination: Secondary | ICD-10-CM

## 2020-09-24 DIAGNOSIS — R269 Unspecified abnormalities of gait and mobility: Secondary | ICD-10-CM

## 2020-09-24 NOTE — Telephone Encounter (Signed)
Placed JCV lab in [facility] lock box for routine lab pick up. Results pending. 

## 2020-09-24 NOTE — Progress Notes (Signed)
[facility]  PATIENT: Danielle Baker DOB: 07/08/1996  REFERRING DOCTOR OR PCP:   Abner Greenspan, MD (PCP) SOURCE: Patient, notes from both hospital admissions, imaging and laboratory reports, MRI images personally reviewed.  _________________________________   HISTORICAL  CHIEF COMPLAINT:  Chief Complaint  Patient presents with  . Follow-up    RM 12. Last seen 08/08/20. On Ocrevus for MS. Wants to discuss going on Tysabri.     HISTORY OF PRESENT ILLNESS:  Update 09/24/2020:   She is Ocrevus and she had her first infusion July 2021 and is scheduled for nextr week.   She feels she has gone downhill since December.   She is very fatigued  .  She had mild itching but no rash.   Benadryl helped.    Next infusion will be in January.    She would like to switch to Tysabri.  She is JCV Ab low positive at 0.6 and understands the risks of PML.     She has had more headaches.  Pain is occipital mostly and is daily.   She also has joint paoin and tinniutus that bothers her.     Gait is about the same and she notes reduced balance and easy fatiguing while she walks..  On a good day without rest she can go > 500 feet.   She notes mild leg weakness, left > right and in both hands.   Numbness is worse on her left side, especiaally the hand.    She is right handed.    Oxcarbazepine stopped helping so she stopped.    She has fatigue daily.    Adderall just helps her  A little bit.   She has stable depression and anxiety. She is on Lexapro.  She sleeps well at night now that she no longer takes naps.  She notes reduced STM, verbal fluency but these are better with Adderall 10 mg po twice a day/    She was working as a Lawyer at [employer].   Due to her numbness, balance issues, and fatigue she has not been able to return,.   Vit D was low and she is taking 5000 U daily (was on 50000 weekly)    MS History: She had the rapid onset of  left arm and flank numbness on 11/07/2019, and to a lesser  extent in the right arm.  In retrospect she had some dysesthetic sensation in her trunk 2 weeks earlier.    She went to [facility] and was admitted for IV Solumedrol.   Two weeks later, she had left visual changes  and more numbness bilaterally but more on her right side and also had left facial numbness.   She has a Lhermitte sign into the right leg with the second episode last week.    She received 3 days IV Solu-Medrol and noted improvement but feels the Lhermitte sign and numbness are worse since then.   She started Ocrevus June/July 2021.     She has no FH of MS.    Data reviewed: MRI brain 06/20/2020 showed T2/FLAIR hyperintense foci in the pons, left middle cerebellar peduncle and in the hemispheres in a pattern and configuration consistent with chronic demyelinating plaque associated with multiple sclerosis.  None of the foci appear to be acute.  Compared to the MRI from 11/07/2019, there do not appear to be any new lesions.  MRI cervical spine 06/20/2020 showed T2 hyperintense foci within the spinal cord posteriorly adjacent to C3-C4, to the  left adjacent to T1 and adjacent to T2 consistent with chronic demyelinating plaque associated with multiple sclerosis.  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  There are also very minimal stable disc bulges at C3-C4 and C5-C6.  There is no nerve root compression.  MRI thoracic spine 06/20/2020 showed Foci within the spinal cord adjacent to T1 and T3-T4 as detailed above.  A third focus adjacent to T2 was noted on the cervical spine images but not clearly apparent on the thoracic spine images.  All 3 of these foci were present on MRIs from 11/07/2019 and all consistent with chronic demyelinating plaque associated with multiple sclerosis.  There are no new lesions.  MRI of the cervical and thoracic spine 11/07/2019 showed enhancing foci at C3C4 posteriorly to the left  and T1 and non-enhancing foci centrally at C1-C2, T2 centrally and T3T4 towards the right.   Brain MRI showed scattered foci in the MCP's, left cerebellar hemisphere, pons, left thalamus and periventricular and juxtacortical white matter.   Several foci in the hemispheres and in the left middle cerebellar peduncle enhanced after contrast.    MRI of the brain and orbits 11/22/2019 showed an additional enhancing lesion in the left posterior lateral pons.    Orbits were normal.    Labs showed negative HIV and NMO Ab.   Vit D was 11 (now being supplemented).   REVIEW OF SYSTEMS: Constitutional: No fevers, chills, sweats, or change in appetite.  She has fatigue. Eyes: No visual changes, double vision, eye pain Ear, nose and throat: No hearing loss, ear pain, nasal congestion, sore throat Cardiovascular: No chest pain, palpitations Respiratory: No shortness of breath at rest or with exertion.   No wheezes GastrointestinaI: No nausea, vomiting, diarrhea, abdominal pain, fecal incontinence Genitourinary:  Urinary urgency and frequency. Musculoskeletal: No neck pain, back pain Integumentary: No rash, pruritus, skin lesions Neurological: as above Psychiatric: Anxiety and mild depression. Endocrine: She is a type I diabetic. Hematologic/Lymphatic: No anemia, purpura, petechiae. Allergic/Immunologic: No itchy/runny eyes, nasal congestion, recent allergic reactions, rashes  ALLERGIES: Allergies  Allergen Reactions  . Enalapril Other (See Comments)    "Weak and blacked out" couldn't stand up. Pt states it dropped her BP too low   . Morphine And Related Rash    HOME MEDICATIONS:  Current Outpatient Medications:  .  acetaminophen (TYLENOL) 325 MG tablet, Take 650 mg by mouth every 6 (six) hours as needed for mild pain., Disp: , Rfl:  .  amphetamine-dextroamphetamine (ADDERALL) 10 MG tablet, Take 1 tablet (10 mg total) by mouth 2 (two) times daily with a meal., Disp: 60 tablet, Rfl: 0 .  busPIRone  (BUSPAR) 15 MG tablet, TAKE 1 TABLET (15 MG TOTAL) BY MOUTH 2 (TWO) TIMES DAILY., Disp: 180 tablet, Rfl: 1 .  dimenhyDRINATE (DRAMAMINE) 50 MG tablet, Take 50 mg by mouth every 8 (eight) hours as needed for nausea or dizziness., Disp: , Rfl:  .  escitalopram (LEXAPRO) 20 MG tablet, Take 20 mg by mouth daily., Disp: , Rfl:  .  HUMALOG KWIKPEN 100 UNIT/ML KwikPen, Inject 23 Units into the skin in the morning, at noon, and at bedtime., Disp: , Rfl:  .  ibuprofen (ADVIL) 800 MG tablet, Take 1 tablet (800 mg total) by mouth 3 (three) times daily., Disp: 21 tablet, Rfl: 0 .  ocrelizumab (OCREVUS) 300 MG/10ML injection, Inject 300 mg into the vein once. Every  six months., Disp: , Rfl:  .  ondansetron (ZOFRAN ODT) 4 MG disintegrating tablet, Take 1 tablet (4 mg total) by mouth every 8 (eight) hours as needed. (Patient taking differently: Take 4 mg by mouth every 8 (eight) hours as needed for nausea or vomiting.), Disp: 20 tablet, Rfl: 6 .  Oxcarbazepine (TRILEPTAL) 300 MG tablet, One p.o. every morning and two p.o. nightly., Disp: 90 tablet, Rfl: 5 .  TOUJEO MAX SOLOSTAR 300 UNIT/ML Solostar Pen, Inject 40 Units into the skin 2 (two) times daily., Disp: 3 mL, Rfl: 0  PAST MEDICAL HISTORY: Past Medical History:  Diagnosis Date  . Heart murmur    as a child  . Hypoglycemia associated with diabetes (HCC)   . Multiple sclerosis (HCC)   . Obesity, morbid (HCC)   . Pancreatitis   . Tachycardia   . Type 1 diabetes mellitus not at goal [facility])    since age of 25 years old    PAST SURGICAL HISTORY: Past Surgical History:  Procedure Laterality Date  . CESAREAN SECTION N/A 03/17/2016   Procedure: CESAREAN SECTION;  Surgeon: Marlow Baars, MD;  Location: [facility] BIRTHING SUITES;  Service: Obstetrics;  Laterality: N/A;  REQUEST RNFA  . CESAREAN SECTION N/A 08/06/2018   Procedure: CESAREAN SECTION;  Surgeon: Marlow Baars, MD;  Location: [facility] BIRTHING SUITES;  Service: Obstetrics;  Laterality: N/A;  RNFA AVAILABLE  .  CHOLECYSTECTOMY      FAMILY HISTORY: Family History  Problem Relation Age of Onset  . Thyroid disease Mother   . Obesity Mother   . Diabetes Father   . Obesity Father   . Obesity Sister   . Cancer Maternal Grandmother   . Obesity Paternal Grandfather     SOCIAL HISTORY:  Social History   Socioeconomic History  . Marital status: Married    Spouse name: Not on file  . Number of children: Not on file  . Years of education: Not on file  . Highest education level: Not on file  Occupational History  . Not on file  Tobacco Use  . Smoking status: Never Smoker  . Smokeless tobacco: Never Used  Vaping Use  . Vaping Use: Never used  Substance and Sexual Activity  . Alcohol use: No    Alcohol/week: 0.0 standard drinks  . Drug use: No  . Sexual activity: Yes    Birth control/protection: None  Other Topics Concern  . Not on file  Social History Narrative   Right handed   Caffeine use: none   Newly married   [employer] - online   Works part time at a daycare   Works at a [employer] time   Social Determinants of Corporate investment banker Strain: Not on [employer] Insecurity: Not on file  Transportation Needs: Not on file  Physical Activity: Not on file  Stress: Not on file  Social Connections: Not on file  Intimate Partner Violence: Not on file     PHYSICAL EXAM  Vitals:   09/24/20 1301  BP: 131/90  Pulse: (!) 126  SpO2: 98%  Weight: 243 lb (110.2 kg)  Height: 5\' 4"  (1.626 m)    Body mass index is 41.71 kg/m.  No exam data present   General: The patient is well-developed and well-nourished and in no acute distress  HEENT:  Head is [place]/AT.  Sclera are anicteric.     Skin: Extremities are without rash or edema.   Neurologic Exam  Mental status: The patient is alert  and oriented x 3 at the time of the examination.  Speech is normal.  Cranial nerves: Extraocular movements are full.   Facial symmetry is present. She reports reduced left  facial sensation to soft touch and temperature.    .Facial strength is normal.  Trapezius and sternocleidomastoid strength is normal. No dysarthria is noted.   No obvious hearing deficits are noted.  Motor:  Muscle bulk is normal.   Tone is normal. Strength is  5 / 5 proximally but reduced RAM left hand and mild reduced grip intrinsic hand muscles, left > right.    Some giveway.   Sensory: She reports reduced touch and vibration sensation in both arms, worse on the left.  She has reduced touch and vibration sensation in the left leg relative to the right.  Coordination: Cerebellar testing reveals good finger-nose-finger right and slightly reduced heel-to-shin bilaterally.  Gait and station: Station is normal.   Gait is mildly wide and tandem is wide. Romberg is borderline positive.   Reflexes: Deep tendon reflexes are symmetric and normal bilaterally.        DIAGNOSTIC DATA (LABS, IMAGING, TESTING) - I reviewed patient records, labs, notes, testing and imaging myself where available.  Lab Results  Component Value Date   WBC 9.7 08/08/2020   HGB 14.4 08/08/2020   HCT 44.0 08/08/2020   MCV 83 08/08/2020   PLT 336 08/08/2020      Component Value Date/Time   NA 139 02/23/2020 0436   K 4.2 02/23/2020 0436   CL 111 02/23/2020 0436   CO2 19 (L) 02/23/2020 0436   GLUCOSE 232 (H) 02/23/2020 0436   BUN 15 02/23/2020 0436   CREATININE 0.58 02/23/2020 0436   CREATININE 0.62 03/22/2015 1210   CALCIUM 8.3 (L) 02/23/2020 0436   PROT 6.3 (L) 11/23/2019 0358   ALBUMIN 3.2 (L) 11/23/2019 0358   AST 73 (H) 11/23/2019 0358   ALT 58 (H) 11/23/2019 0358   ALKPHOS 88 11/23/2019 0358   BILITOT 0.5 11/23/2019 0358   GFRNONAA >60 02/23/2020 0436   GFRNONAA >89 03/22/2015 1210   GFRAA >60 02/23/2020 0436   GFRAA >89 03/22/2015 1210   Lab Results  Component Value Date   CHOL 160 03/22/2015   HDL 35.30 (L) 03/22/2015   LDLCALC 95 03/22/2015   TRIG 151.0 (H) 03/22/2015   CHOLHDL 5 03/22/2015    Lab Results  Component Value Date   HGBA1C 10.0 (H) 02/21/2020   Lab Results  Component Value Date   VITAMINB12 229 11/07/2019   Lab Results  Component Value Date   TSH 2.716 11/07/2019       ASSESSMENT AND PLAN  Multiple sclerosis (HCC) - Plan: Stratify JCV Antibody Test ([facility]), CBC with Differential/Platelet, Hepatic function panel  High risk medication use - Plan: Stratify JCV Antibody Test ([facility]), CBC with Differential/Platelet, Hepatic function panel  Type 1 diabetes mellitus without complication (HCC)  Numbness  Gait disturbance  Attention deficit  Urinary urgency  1.   D/C Ocrevus and change to Tysabri   Check  JCV Ab and other labs .    2.   She continues to be unable to work due to reduced gait and left > right arm impairments.   She has applied for disability 3.   Adderall for weight loss, ADD and sleepiness/fatigue 4.   rtc 4 months, sooner if new or worsening neurologic function   Mickal Meno A. Epimenio FootSater, MD, [facility],FAAN 09/24/2020, 1:32 PM Certified in Neurology, Clinical Neurophysiology, Sleep Medicine and Neuroimaging  [facility] [address], [address] [place], [place] [address] [phone] 

## 2020-09-25 ENCOUNTER — Telehealth: Payer: Self-pay

## 2020-09-25 LAB — CBC WITH DIFFERENTIAL/PLATELET
Basophils Absolute: 0.1 10*3/uL (ref 0.0–0.2)
Basos: 1 %
EOS (ABSOLUTE): 0.2 10*3/uL (ref 0.0–0.4)
Eos: 2 %
Hematocrit: 43.2 % (ref 34.0–46.6)
Hemoglobin: 13.9 g/dL (ref 11.1–15.9)
Immature Grans (Abs): 0 10*3/uL (ref 0.0–0.1)
Immature Granulocytes: 0 %
Lymphocytes Absolute: 2.8 10*3/uL (ref 0.7–3.1)
Lymphs: 36 %
MCH: 27.3 pg (ref 26.6–33.0)
MCHC: 32.2 g/dL (ref 31.5–35.7)
MCV: 85 fL (ref 79–97)
Monocytes Absolute: 0.4 10*3/uL (ref 0.1–0.9)
Monocytes: 5 %
Neutrophils Absolute: 4.4 10*3/uL (ref 1.4–7.0)
Neutrophils: 56 %
Platelets: 357 10*3/uL (ref 150–450)
RBC: 5.1 x10E6/uL (ref 3.77–5.28)
RDW: 13.5 % (ref 11.7–15.4)
WBC: 7.9 10*3/uL (ref 3.4–10.8)

## 2020-09-25 LAB — HEPATIC FUNCTION PANEL
ALT: 16 IU/L (ref 0–32)
AST: 14 IU/L (ref 0–40)
Albumin: 4 g/dL (ref 3.9–5.0)
Alkaline Phosphatase: 123 IU/L — ABNORMAL HIGH (ref 44–121)
Bilirubin Total: 0.3 mg/dL (ref 0.0–1.2)
Bilirubin, Direct: 0.11 mg/dL (ref 0.00–0.40)
Total Protein: 6.2 g/dL (ref 6.0–8.5)

## 2020-09-25 LAB — VARICELLA ZOSTER ANTIBODY, IGG: Varicella zoster IgG: <135 index — ABNORMAL LOW (ref >165)

## 2020-09-25 NOTE — Telephone Encounter (Signed)
Spoke with Dr. Epimenio Foot.  We can go ahead and send in Tysabri start form and give the order to [facility]  Her JCV results should come back next week.

## 2020-09-25 NOTE — Telephone Encounter (Signed)
Tysabri order and referral given to [name], RN in [employer].

## 2020-09-25 NOTE — Telephone Encounter (Signed)
Tysabri Start Form completed.  Given to Dr. Epimenio Foot for review and signature.  JCV results pending.

## 2020-10-01 NOTE — Telephone Encounter (Signed)
JCV ab drawn on 09/24/20 positive, index: 0.52. Gave to MD for review.

## 2020-10-08 NOTE — Telephone Encounter (Signed)
I spoke with [name], RN in the infusion suite.  Patient's Danielle Baker is still pending insurance authorization.

## 2020-10-09 ENCOUNTER — Encounter: Payer: Self-pay | Admitting: Neurology

## 2020-10-09 NOTE — Telephone Encounter (Signed)
I called [facility] to check the status of the Tysabri authorization.  I spoke with the pharmacy helpdesk.  They report that no authorization has been initiated for Tysabri.  I was transferred to the medical department.  I spoke with April.  She reports an authorization for Tysabri was started but they needed more information.  She reports that they did not reach out to our office for further clarification on the authorization.  I was directed to complete another prior authorization for Tysabri and mark it as urgent.  I have completed this authorization and faxed it to [facility] health as requested and marked as urgent

## 2020-10-10 NOTE — Telephone Encounter (Signed)
Reached out to [facility]. Fax# [phone]. Faxed appeal letter, marked urgent. Received fax confirmation, waiting on determination.

## 2020-10-11 NOTE — Telephone Encounter (Signed)
Received denial letter via [employer]ex dated 10/09/20. Faxed appeal letter to [facility] at [phone]. Received fax confirmation. Marked urgent, waiting on determination.

## 2020-10-15 ENCOUNTER — Other Ambulatory Visit: Payer: Self-pay

## 2020-10-15 MED ORDER — AMPHETAMINE-DEXTROAMPHETAMINE 10 MG PO TABS: 10.0000 mg | ORAL_TABLET | Freq: Two times a day (BID) | ORAL | 0 refills | Status: DC

## 2020-10-15 NOTE — Telephone Encounter (Signed)
Spoke with Freddi Starr, RN in infusion suite.  [facility] has improved patient's Tysabri.  Case #[id number].  The dates are from October 09, 2020 to October 09, 2021.  Apparently [employer] has also approved patient for co-pay assistance.  [name], RN spoke with patient this morning.

## 2020-10-23 NOTE — Telephone Encounter (Signed)
Per Freddi Starr, RN in the infusion suite, patient is coming for her first Tysabri infusion on October 24, 2020.

## 2020-10-24 ENCOUNTER — Other Ambulatory Visit: Payer: Self-pay | Admitting: *Deleted

## 2020-10-24 DIAGNOSIS — G35 Multiple sclerosis: Secondary | ICD-10-CM

## 2020-10-24 DIAGNOSIS — Z79899 Other long term (current) drug therapy: Secondary | ICD-10-CM

## 2020-10-24 DIAGNOSIS — I878 Other specified disorders of veins: Secondary | ICD-10-CM

## 2020-10-25 ENCOUNTER — Other Ambulatory Visit (HOSPITAL_COMMUNITY): Payer: Self-pay | Admitting: Neurology

## 2020-10-25 DIAGNOSIS — I878 Other specified disorders of veins: Secondary | ICD-10-CM

## 2020-10-25 DIAGNOSIS — G35 Multiple sclerosis: Secondary | ICD-10-CM

## 2020-10-25 DIAGNOSIS — Z79899 Other long term (current) drug therapy: Secondary | ICD-10-CM

## 2020-10-25 DIAGNOSIS — G35D Multiple sclerosis, unspecified: Secondary | ICD-10-CM

## 2020-10-29 NOTE — Telephone Encounter (Signed)
Patient had her first Tysabri infusion on October 24, 2020.

## 2020-11-01 ENCOUNTER — Other Ambulatory Visit: Payer: Self-pay | Admitting: Physician Assistant

## 2020-11-02 ENCOUNTER — Encounter (HOSPITAL_COMMUNITY): Payer: Self-pay

## 2020-11-02 ENCOUNTER — Other Ambulatory Visit: Payer: Self-pay

## 2020-11-02 ENCOUNTER — Ambulatory Visit (HOSPITAL_COMMUNITY)
Admission: RE | Admit: 2020-11-02 | Discharge: 2020-11-02 | Disposition: A | Discharge status: Home / Self Care | Payer: 59 | Source: Ambulatory Visit | Attending: Neurology | Admitting: Neurology

## 2020-11-02 DIAGNOSIS — G35 Multiple sclerosis: Secondary | ICD-10-CM | POA: Diagnosis not present

## 2020-11-02 DIAGNOSIS — Z6841 Body Mass Index (BMI) 40.0 and over, adult: Secondary | ICD-10-CM | POA: Diagnosis not present

## 2020-11-02 DIAGNOSIS — Z794 Long term (current) use of insulin: Secondary | ICD-10-CM | POA: Insufficient documentation

## 2020-11-02 DIAGNOSIS — I878 Other specified disorders of veins: Secondary | ICD-10-CM | POA: Diagnosis present

## 2020-11-02 DIAGNOSIS — Z833 Family history of diabetes mellitus: Secondary | ICD-10-CM | POA: Diagnosis not present

## 2020-11-02 DIAGNOSIS — Z888 Allergy status to other drugs, medicaments and biological substances status: Secondary | ICD-10-CM | POA: Insufficient documentation

## 2020-11-02 DIAGNOSIS — Z9049 Acquired absence of other specified parts of digestive tract: Secondary | ICD-10-CM | POA: Insufficient documentation

## 2020-11-02 DIAGNOSIS — Z8349 Family history of other endocrine, nutritional and metabolic diseases: Secondary | ICD-10-CM | POA: Insufficient documentation

## 2020-11-02 DIAGNOSIS — Z885 Allergy status to narcotic agent status: Secondary | ICD-10-CM | POA: Insufficient documentation

## 2020-11-02 DIAGNOSIS — E1065 Type 1 diabetes mellitus with hyperglycemia: Secondary | ICD-10-CM | POA: Insufficient documentation

## 2020-11-02 DIAGNOSIS — Z79899 Other long term (current) drug therapy: Secondary | ICD-10-CM | POA: Insufficient documentation

## 2020-11-02 HISTORY — PX: IR IMAGING GUIDED PORT INSERTION: IMG5740

## 2020-11-02 LAB — GLUCOSE, CAPILLARY: Glucose-Capillary: 171 mg/dL — ABNORMAL HIGH (ref 70–99)

## 2020-11-02 LAB — PREGNANCY, URINE: Preg Test, Ur: NEGATIVE

## 2020-11-02 MED ORDER — LIDOCAINE HCL 1 % IJ SOLN
INTRAMUSCULAR | Status: AC
  Filled 2020-11-02: qty 20

## 2020-11-02 MED ORDER — CEFAZOLIN SODIUM-DEXTROSE 2-4 GM/100ML-% IV SOLN
INTRAVENOUS | Status: AC
  Filled 2020-11-02: qty 100

## 2020-11-02 MED ORDER — KETOROLAC TROMETHAMINE 30 MG/ML IJ SOLN
INTRAMUSCULAR | Status: AC
  Filled 2020-11-02: qty 1

## 2020-11-02 MED ORDER — FENTANYL CITRATE (PF) 100 MCG/2ML IJ SOLN
INTRAMUSCULAR | Status: AC
  Filled 2020-11-02: qty 2

## 2020-11-02 MED ORDER — MIDAZOLAM HCL 2 MG/2ML IJ SOLN
INTRAMUSCULAR | Status: AC | PRN
Start: 2020-11-02 — End: 2020-11-02
  Administered 2020-11-02: 0.5 mg via INTRAVENOUS
  Administered 2020-11-02: 1 mg via INTRAVENOUS

## 2020-11-02 MED ORDER — LIDOCAINE-EPINEPHRINE (PF) 1 %-1:200000 IJ SOLN
INTRAMUSCULAR | Status: AC | PRN
  Administered 2020-11-02: 20 mL

## 2020-11-02 MED ORDER — MIDAZOLAM HCL 2 MG/2ML IJ SOLN
INTRAMUSCULAR | Status: AC
  Filled 2020-11-02: qty 2

## 2020-11-02 MED ORDER — SODIUM CHLORIDE 0.9 % IV SOLN
INTRAVENOUS | Status: AC | PRN
  Administered 2020-11-02: 10 mL/h via INTRAVENOUS

## 2020-11-02 MED ORDER — HEPARIN SOD (PORK) LOCK FLUSH 100 UNIT/ML IV SOLN
INTRAVENOUS | Status: AC
  Filled 2020-11-02: qty 5

## 2020-11-02 MED ORDER — KETOROLAC TROMETHAMINE 15 MG/ML IJ SOLN
15.0000 mg | Freq: Once | INTRAMUSCULAR | Status: AC
  Administered 2020-11-02: 15 mg via INTRAVENOUS

## 2020-11-02 MED ORDER — FENTANYL CITRATE (PF) 100 MCG/2ML IJ SOLN
INTRAMUSCULAR | Status: AC | PRN
  Administered 2020-11-02: 50 ug via INTRAVENOUS
  Administered 2020-11-02: 25 ug via INTRAVENOUS

## 2020-11-02 NOTE — Sedation Documentation (Signed)
Having increasing pain right upper chest and lower neck 5/10. No noted swelling. Dr Loreta Ave notified and Toradol ordered.

## 2020-11-02 NOTE — Sedation Documentation (Signed)
IV Toradol 15mg  given.

## 2020-11-02 NOTE — Discharge Instructions (Addendum)
Implanted Port Insertion, Care After °This sheet gives you information about how to care for yourself after your procedure. Your health care provider may also give you more specific instructions. If you have problems or questions, contact your health care provider. °What can I expect after the procedure? °After the procedure, it is common to have: °· Discomfort at the port insertion site. °· Bruising on the skin over the port. This should improve over 3-4 days. °Follow these instructions at home: °Port care °· After your port is placed, you will get a manufacturer's information card. The card has information about your port. Keep this card with you at all times. °· Take care of the port as told by your health care provider. Ask your health care provider if you or a family member can get training for taking care of the port at home. A home health care nurse may also take care of the port. °· Make sure to remember what type of port you have. °Incision care °· Follow instructions from your health care provider about how to take care of your port insertion site. Make sure you: °? Wash your hands with soap and water before and after you change your bandage (dressing). If soap and water are not available, use hand sanitizer. °? Change your dressing as told by your health care provider. °? Leave stitches (sutures), skin glue, or adhesive strips in place. These skin closures may need to stay in place for 2 weeks or longer. If adhesive strip edges start to loosen and curl up, you may trim the loose edges. Do not remove adhesive strips completely unless your health care provider tells you to do that. °· Check your port insertion site every day for signs of infection. Check for: °? Redness, swelling, or pain. °? Fluid or blood. °? Warmth. °? Pus or a bad smell.  °  °  °Activity °· Return to your normal activities as told by your health care provider. Ask your health care provider what activities are safe for you. °· Do not  lift anything that is heavier than 10 lb (4.5 kg), or the limit that you are told, until your health care provider says that it is safe. °General instructions °· Take over-the-counter and prescription medicines only as told by your health care provider. °· Do not take baths, swim, or use a hot tub until your health care provider approves. Ask your health care provider if you may take showers. You may only be allowed to take sponge baths. °· Do not drive for 24 hours if you were given a sedative during your procedure. °· Wear a medical alert bracelet in case of an emergency. This will tell any health care providers that you have a port. °· Keep all follow-up visits as told by your health care provider. This is important. °Contact a health care provider if: °· You cannot flush your port with saline as directed, or you cannot draw blood from the port. °· You have a fever or chills. °· You have redness, swelling, or pain around your port insertion site. °· You have fluid or blood coming from your port insertion site. °· Your port insertion site feels warm to the touch. °· You have pus or a bad smell coming from the port insertion site. °Get help right away if: °· You have chest pain or shortness of breath. °· You have bleeding from your port that you cannot control. °Summary °· Take care of the port as told by your   health care provider. Keep the manufacturer's information card with you at all times. °· Change your dressing as told by your health care provider. °· Contact a health care provider if you have a fever or chills or if you have redness, swelling, or pain around your port insertion site. °· Keep all follow-up visits as told by your health care provider. °This information is not intended to replace advice given to you by your health care provider. Make sure you discuss any questions you have with your health care provider. °Document Revised: 03/02/2018 Document Reviewed: 03/02/2018 °Elsevier Patient Education ©  2021 Elsevier Inc. °Moderate Conscious Sedation, Adult °Sedation is the use of medicines to promote relaxation and to relieve discomfort and anxiety. Moderate conscious sedation is a type of sedation. Under moderate conscious sedation, you are less alert than normal, but you are still able to respond to instructions, touch, or both. °Moderate conscious sedation is used during short medical and dental procedures. It is milder than deep sedation, which is a type of sedation under which you cannot be easily woken up. It is also milder than general anesthesia, which is the use of medicines to make you unconscious. Moderate conscious sedation allows you to return to your regular activities sooner. °Tell a health care provider about: °· Any allergies you have. °· All medicines you are taking, including vitamins, herbs, eye drops, creams, and over-the-counter medicines. °· Any use of steroids. This includes steroids taken by mouth or as a cream. °· Any problems you or family members have had with sedatives and anesthetic medicines. °· Any blood disorders you have. °· Any surgeries you have had. °· Any medical conditions you have, such as sleep apnea. °· Whether you are pregnant or may be pregnant. °· Any use of cigarettes, alcohol, marijuana, or drugs. °What are the risks? °Generally, this is a safe procedure. However, problems may occur, including: °· Getting too much medicine (oversedation). °· Nausea. °· Allergic reaction to medicines. °· Trouble breathing. If this happens, a breathing tube may be used. It will be removed when you are awake and breathing on your own. °· Heart trouble. °· Lung trouble. °· Confusion that gets better with time (emergence delirium). °What happens before the procedure? °Staying hydrated °Follow instructions from your health care provider about hydration, which may include: °· Up to 2 hours before the procedure - you may continue to drink clear liquids, such as water, clear fruit juice, black  coffee, and plain tea. °Eating and drinking restrictions °Follow instructions from your health care provider about eating and drinking, which may include: °· 8 hours before the procedure - stop eating heavy meals or foods, such as meat, fried foods, or fatty foods. °· 6 hours before the procedure - stop eating light meals or foods, such as toast or cereal. °· 6 hours before the procedure - stop drinking milk or drinks that contain milk. °· 2 hours before the procedure - stop drinking clear liquids. °Medicines °Ask your health care provider about: °· Changing or stopping your regular medicines. This is especially important if you are taking diabetes medicines or blood thinners. °· Taking medicines such as aspirin and ibuprofen. These medicines can thin your blood. Do not take these medicines unless your health care provider tells you to take them. °· Taking over-the-counter medicines, vitamins, herbs, and supplements. °Tests and exams °· You will have a physical exam. °· You may have blood tests done to show how well: °? Your kidneys and liver work. °? Your   blood clots. °General instructions °· Plan to have a responsible adult take you home from the hospital or clinic. °· If you will be going home right after the procedure, plan to have a responsible adult care for you for the time you are told. This is important. °What happens during the procedure? °· You will be given the sedative. The sedative may be given: °? As a pill that you will swallow. It can also be inserted into the rectum. °? As a spray through the nose. °? As an injection into the muscle. °? As an injection into the vein through an IV. °· You may be given oxygen as needed. °· Your breathing, heart rate, and blood pressure will be monitored during the procedure. °· The medical or dental procedure will be done. °The procedure may vary among health care providers and hospitals.   °What happens after the procedure? °· Your blood pressure, heart rate,  breathing rate, and blood oxygen level will be monitored until you leave the hospital or clinic. °· You will get fluids through your IV if needed. °· Do not drive or operate machinery until your health care provider says that it is safe. °Summary °· Sedation is the use of medicines to promote relaxation and to relieve discomfort and anxiety. Moderate conscious sedation is a type of sedation that is used during short medical and dental procedures. °· Tell the health care provider about any medical conditions that you have and about all the medicines that you are taking. °· You will be given the sedative as a pill, a spray through the nose, an injection into the muscle, or an injection into the vein through an IV. Vital signs are monitored during the sedation. °· Moderate conscious sedation allows you to return to your regular activities sooner. °This information is not intended to replace advice given to you by your health care provider. Make sure you discuss any questions you have with your health care provider. °Document Revised: 12/02/2019 Document Reviewed: 06/30/2019 °Elsevier Patient Education © 2021 Elsevier Inc. ° °

## 2020-11-02 NOTE — H&P (Signed)
Chief Complaint: Patient was seen in consultation today for multiple sclerosis  Referring Physician(s): Sater,Richard A  Supervising Physician: Gilmer Mor  Patient Status: [facility] - Out-pt  History of Present Illness: Danielle Baker is a 25 y.o. female with history of DM1, pancreatitis, multiple sclerosis who get regular infusions and lab draws.  Danielle Baker has poor venous access and is referred to IR for Port-A-Cath placement by Dr. Epimenio Foot.   Danielle Baker presents in her usual state of health today.  Danielle Baker denies fever, chills, nausea, vomiting, abdominal pain, dysuria.  Danielle Baker has been NPO.  Danielle Baker does not take blood thinners.  Danielle Baker has family support for transportation and monitoring this evening.   Past Medical History:  Diagnosis Date  . Heart murmur    as a child  . Hypoglycemia associated with diabetes (HCC)   . Multiple sclerosis (HCC)   . Obesity, morbid (HCC)   . Pancreatitis   . Tachycardia   . Type 1 diabetes mellitus not at goal [facility])    since age of 25 years old    Past Surgical History:  Procedure Laterality Date  . CESAREAN SECTION N/A 03/17/2016   Procedure: CESAREAN SECTION;  Surgeon: Marlow Baars, MD;  Location: [facility] BIRTHING SUITES;  Service: Obstetrics;  Laterality: N/A;  REQUEST RNFA  . CESAREAN SECTION N/A 08/06/2018   Procedure: CESAREAN SECTION;  Surgeon: Marlow Baars, MD;  Location: [facility] BIRTHING SUITES;  Service: Obstetrics;  Laterality: N/A;  RNFA AVAILABLE  . CHOLECYSTECTOMY      Allergies: Enalapril and Morphine and related  Medications: Prior to Admission medications   Medication Sig Start Date End Date Taking? Authorizing Provider  acetaminophen (TYLENOL) 325 MG tablet Take 325 mg by mouth every 6 (six) hours as needed for mild pain.   Yes [provider]  amphetamine-dextroamphetamine (ADDERALL) 10 MG tablet Take 1 tablet (10 mg total) by mouth 2 (two) times daily with a meal. 10/15/20  Yes Dohmeier, Porfirio Mylar, MD  busPIRone (BUSPAR) 15 MG tablet TAKE  1 TABLET (15 MG TOTAL) BY MOUTH 2 (TWO) TIMES DAILY. 06/11/20  Yes Sater, Pearletha Furl, MD  Cholecalciferol (DIALYVITE VITAMIN D 5000) 125 MCG (5000 UT) capsule Take 5,000 Units by mouth daily.   Yes [provider]  escitalopram (LEXAPRO) 20 MG tablet Take 20 mg by mouth daily. 10/11/19  Yes [provider]  ibuprofen (ADVIL) 200 MG tablet Take 200 mg by mouth every 6 (six) hours as needed for moderate pain.   Yes [provider]  insulin aspart (NOVOLOG) 100 UNIT/ML injection Inject 10-23 Units into the skin 3 (three) times daily before meals.   Yes [provider]  insulin degludec (TRESIBA FLEXTOUCH) 200 UNIT/ML FlexTouch Pen Inject 40 Units into the skin 2 (two) times daily.   Yes [provider]  natalizumab (TYSABRI) 300 MG/15ML injection Inject 300 mg as directed every 30 (thirty) days.   Yes [provider]  ondansetron (ZOFRAN ODT) 4 MG disintegrating tablet Take 1 tablet (4 mg total) by mouth every 8 (eight) hours as needed. Patient taking differently: Take 4 mg by mouth every 8 (eight) hours as needed for nausea or vomiting. 02/16/20  Yes Levert Feinstein, MD  Semaglutide,0.25 or 0.5MG /DOS, (OZEMPIC, 0.25 OR 0.5 MG/DOSE,) 2 MG/1.5ML SOPN Inject 0.5 mg into the skin every Tuesday.   Yes [provider]  Oxcarbazepine (TRILEPTAL) 300 MG tablet One p.o. every morning and two p.o. nightly. Patient not taking: No sig reported 03/21/20   Asa Lente, MD  TOUJEO MAX SOLOSTAR 300 UNIT/ML Solostar Pen Inject 40 Units into the skin 2 (two) times daily. Patient not taking: Reported on 10/29/2020 11/24/19   Katherine Roan, MD     Family History  Problem Relation Age of Onset  . Thyroid disease Mother   . Obesity Mother   . Diabetes Father   . Obesity Father   . Obesity Sister   . Cancer Maternal Grandmother   . Obesity Paternal Grandfather     Social History   Socioeconomic History  . Marital status: Married    Spouse name: Not on  file  . Number of children: Not on file  . Years of education: Not on file  . Highest education level: Not on file  Occupational History  . Not on file  Tobacco Use  . Smoking status: Never Smoker  . Smokeless tobacco: Never Used  Vaping Use  . Vaping Use: Never used  Substance and Sexual Activity  . Alcohol use: No    Alcohol/week: 0.0 standard drinks  . Drug use: No  . Sexual activity: Yes    Birth control/protection: None  Other Topics Concern  . Not on file  Social History Narrative   Right handed   Caffeine use: none   Newly married   [employer] - online   Works part time at a daycare   Works at a [employer] time   Social Determinants of Corporate investment banker Strain: Not on [employer] Insecurity: Not on file  Transportation Needs: Not on file  Physical Activity: Not on file  Stress: Not on file  Social Connections: Not on file     Review of Systems: A 12 point ROS discussed and pertinent positives are indicated in the HPI above.  All other systems are negative.  Review of Systems  Constitutional: Negative for fatigue and fever.  Respiratory: Negative for cough and shortness of breath.   Cardiovascular: Negative for chest pain.  Gastrointestinal: Negative for abdominal pain, diarrhea, nausea and vomiting.  Genitourinary: Negative for dysuria.  Musculoskeletal: Negative for back pain.  Psychiatric/Behavioral: Negative for behavioral problems and confusion.    Vital Signs: BP 124/84   Pulse 98   Temp 97.6 F (36.4 C) (Oral)   Resp 16   Ht 5\' 4"  (1.626 m)   Wt 237 lb (107.5 kg)   LMP 10/31/2020 (Exact Date)   SpO2 100%   BMI 40.68 kg/m   Physical Exam Vitals and nursing note reviewed.  Constitutional:      General: Danielle Baker is not in acute distress.    Appearance: Normal appearance. Danielle Baker is not ill-appearing.  HENT:     Mouth/Throat:     Mouth: Mucous membranes are moist.     Pharynx: Oropharynx is clear.  Cardiovascular:     Rate  and Rhythm: Normal rate and regular rhythm.  Pulmonary:     Effort: Pulmonary effort is normal. No respiratory distress.     Breath sounds: Normal breath sounds.  Abdominal:     General: Abdomen is flat.     Palpations: Abdomen is soft.  Musculoskeletal:     Cervical back: Normal range of motion and neck supple.  Skin:    General: Skin is warm and dry.  Neurological:     General: No focal deficit present.     Mental Status: Danielle Baker is alert and oriented to person, place, and time. Mental status is at baseline.  Psychiatric:        Mood and  Affect: Mood normal.        Behavior: Behavior normal.        Thought Content: Thought content normal.        Judgment: Judgment normal.      MD Evaluation Airway: WNL Heart: WNL Abdomen: WNL Chest/ Lungs: WNL ASA  Classification: 3 Mallampati/Airway Score: One   Imaging: No results found.  Labs:  CBC: Recent Labs    02/22/20 0447 02/23/20 0436 08/08/20 1336 09/24/20 1345  WBC 11.6* 12.0* 9.7 7.9  HGB 12.9 11.9* 14.4 13.9  HCT 42.0 37.9 44.0 43.2  PLT 321 351 336 357    COAGS: Recent Labs    11/07/19 0008  INR 0.9  APTT 24    BMP: Recent Labs    02/21/20 1459 02/22/20 0447 02/22/20 1456 02/23/20 0436  NA 140 136 138 139  K 4.0 4.6 3.9 4.2  CL 107 108 110 111  CO2 21* 16* 17* 19*  GLUCOSE 143* 278* 272* 232*  BUN 11 9 10 15   CALCIUM 8.7* 8.5* 8.3* 8.3*  CREATININE 0.75 0.66 0.55 0.58  GFRNONAA >60 >60 >60 >60  GFRAA >60 >60 >60 >60    LIVER FUNCTION TESTS: Recent Labs    11/07/19 0008 11/08/19 0402 11/23/19 0358 09/24/20 1345  BILITOT 0.5 0.7 0.5 0.3  AST 21 38 73* 14  ALT 26 34 58* 16  ALKPHOS 104 97 88 123*  PROT 6.5 6.3* 6.3* 6.2  ALBUMIN 3.1* 3.0* 3.2* 4.0    TUMOR MARKERS: No results for input(s): AFPTM, CEA, CA199, CHROMGRNA in the last 8760 hours.  Assessment and Plan: Patient with past medical history of DM1, multiple sclerosis presents with complaint of poor venous access.  IR  consulted for Port-A-Cath placement at the request of Dr. 11/22/20. Case reviewed by Dr. Epimenio Foot who approves patient for procedure.  Patient presents today in their usual state of health.  Danielle Baker has been NPO and is not currently on blood thinners.   Risks and benefits of image guided port-a-catheter placement was discussed with the patient including, but not limited to bleeding, infection, pneumothorax, or fibrin sheath development and need for additional procedures.  All of the patient's questions were answered, patient is agreeable to proceed. Consent signed and in chart.   Thank you for this interesting consult.  I greatly enjoyed meeting Danielle Baker and look forward to participating in their care.  A copy of this report was sent to the requesting provider on this date.  Electronically Signed: [name], PA 11/02/2020, 8:34 AM   I spent a total of  30 Minutes   in face to face in clinical consultation, greater than 50% of which was counseling/coordinating care for multiple sclerosis.

## 2020-11-02 NOTE — Procedures (Signed)
Interventional Radiology Procedure Note  Procedure: Placement of a right IJ approach single lumen PowerPort.  Tip is positioned at the superior cavoatrial junction and catheter is ready for immediate use.  Complications: None Recommendations:  - Ok to shower tomorrow - Do not submerge for 7 days - Routine line care   Signed,  Imberly Troxler S. Raylyn Speckman, DO   

## 2020-11-08 ENCOUNTER — Other Ambulatory Visit: Payer: Self-pay | Admitting: Neurology

## 2020-11-08 MED ORDER — AMPHETAMINE-DEXTROAMPHETAMINE 10 MG PO TABS: 10.0000 mg | ORAL_TABLET | Freq: Two times a day (BID) | ORAL | 0 refills | Status: DC

## 2020-11-19 ENCOUNTER — Other Ambulatory Visit: Payer: Self-pay | Admitting: *Deleted

## 2020-11-19 MED ORDER — MECLIZINE HCL 25 MG PO TABS: 25.0000 mg | ORAL_TABLET | Freq: Three times a day (TID) | ORAL | 0 refills | Status: DC | PRN

## 2020-11-23 ENCOUNTER — Telehealth: Payer: 59 | Admitting: Nurse Practitioner

## 2020-11-23 DIAGNOSIS — N3000 Acute cystitis without hematuria: Secondary | ICD-10-CM

## 2020-11-23 MED ORDER — CEPHALEXIN 500 MG PO CAPS: 500.0000 mg | ORAL_CAPSULE | Freq: Two times a day (BID) | ORAL | 0 refills | Status: DC

## 2020-11-23 NOTE — Progress Notes (Signed)

## 2020-12-05 ENCOUNTER — Ambulatory Visit: Payer: No Typology Code available for payment source | Admitting: Neurology

## 2020-12-08 ENCOUNTER — Other Ambulatory Visit: Payer: Self-pay

## 2020-12-10 NOTE — Telephone Encounter (Signed)
Pt is due for a refill on adderall. [place] Controlled Substance Registry checked and is appropriate. Will forward to [facility] in Dr. Bonnita Hollow absence.

## 2020-12-11 MED ORDER — AMPHETAMINE-DEXTROAMPHETAMINE 10 MG PO TABS: 10.0000 mg | ORAL_TABLET | Freq: Two times a day (BID) | ORAL | 0 refills | Status: DC

## 2021-01-05 ENCOUNTER — Other Ambulatory Visit: Payer: Self-pay

## 2021-01-07 MED ORDER — AMPHETAMINE-DEXTROAMPHETAMINE 10 MG PO TABS: 10.0000 mg | ORAL_TABLET | Freq: Two times a day (BID) | ORAL | 0 refills | Status: DC

## 2021-02-13 ENCOUNTER — Other Ambulatory Visit: Payer: Self-pay

## 2021-02-14 ENCOUNTER — Encounter: Payer: Self-pay | Admitting: Neurology

## 2021-02-14 ENCOUNTER — Ambulatory Visit (INDEPENDENT_AMBULATORY_CARE_PROVIDER_SITE_OTHER): Payer: 59 | Admitting: Neurology

## 2021-02-14 VITALS — BP 121/78 | HR 104 | Ht 64.0 in | Wt 244.5 lb

## 2021-02-14 DIAGNOSIS — R269 Unspecified abnormalities of gait and mobility: Secondary | ICD-10-CM

## 2021-02-14 DIAGNOSIS — Z79899 Other long term (current) drug therapy: Secondary | ICD-10-CM

## 2021-02-14 DIAGNOSIS — E109 Type 1 diabetes mellitus without complications: Secondary | ICD-10-CM

## 2021-02-14 DIAGNOSIS — R200 Anesthesia of skin: Secondary | ICD-10-CM | POA: Diagnosis not present

## 2021-02-14 DIAGNOSIS — R41840 Attention and concentration deficit: Secondary | ICD-10-CM

## 2021-02-14 DIAGNOSIS — R5383 Other fatigue: Secondary | ICD-10-CM

## 2021-02-14 DIAGNOSIS — G35 Multiple sclerosis: Secondary | ICD-10-CM | POA: Diagnosis not present

## 2021-02-14 MED ORDER — ZONISAMIDE 100 MG PO CAPS: 100.0000 mg | ORAL_CAPSULE | Freq: Every day | ORAL | 1 refills | Status: DC

## 2021-02-14 MED ORDER — AMPHETAMINE-DEXTROAMPHETAMINE 10 MG PO TABS: 10.0000 mg | ORAL_TABLET | Freq: Two times a day (BID) | ORAL | 0 refills | Status: DC

## 2021-02-14 MED ORDER — BUSPIRONE HCL 30 MG PO TABS: 30.0000 mg | ORAL_TABLET | Freq: Two times a day (BID) | ORAL | 3 refills | Status: DC

## 2021-02-14 MED ORDER — ONDANSETRON 4 MG PO TBDP: 4.0000 mg | ORAL_TABLET | Freq: Three times a day (TID) | ORAL | 6 refills | Status: DC | PRN

## 2021-02-14 NOTE — Progress Notes (Signed)
[facility]  PATIENT: Danielle Baker DOB: [birth year]  REFERRING DOCTOR OR PCP:   Abner Greenspan, MD (PCP) SOURCE: Patient, notes from both hospital admissions, imaging and laboratory reports, MRI images personally reviewed.  _________________________________   HISTORICAL  CHIEF COMPLAINT:  Chief Complaint  Patient presents with   Follow-up    RM 11, alone. Last seen 09/24/2020. On Tysabri for MS. Last infusion: 01/17/21, next: 02/19/21. Receives at [facility] w/ intrafusion. Last JCV 09/24/20 positive, index: 0.52.    HISTORY OF PRESENT ILLNESS:  Update 02/14/21:   She is on Tysabri.She is JCV Ab low positive at 0.52 and understands the risks of PML.     She is tolerating it bette than the Ocrevus.   She feels she can function a little better.     Gait is about the same and she notes reduced balance and easy fatiguing while she walks..  On a good day without rest she can go > 500 feet.   She notes left arm and leg numbness but more tingling on the right.   She has had some twitching in the left face.    She was onxcarbazapine for dysesthesias but it stopped helping so she stopped.     Meclizine has helped the vertigo and ondansetron helps the nausea.     She has fatigue improve on Tysabri and with Adderall.   She has stable depression and anxiety. She is on Lexapro.  She sleeps well at night now that she no longer takes naps.  She notes reduced STM, verbal fluency but these are better with Adderall 10 mg po twice a day/    She has some muscle pain in her lower back that fluctuates.   This occurs more in the afternoons and after she has done more tha day.   She has had headaches and pain is occipital mostly  She was working as a Lawyer at [employer].   Due to her numbness, balance issues, and fatigue she has not been able to return.    Vit D was low and she is taking 5000 U daily (was on 50000 weekly)       MS History: She had the rapid onset of  left arm and flank numbness on  11/07/2019, and to a lesser extent in the right arm.  In retrospect she had some dysesthetic sensation in her trunk 2 weeks earlier.    She went to [facility] and was admitted for IV Solumedrol.   Two weeks later, she had left visual changes  and more numbness bilaterally but more on her right side and also had left facial numbness.   She has a Lhermitte sign into the right leg with the second episode last week.    She received 3 days IV Solu-Medrol and noted improvement but feels the Lhermitte sign and numbness are worse since then.   She started Ocrevus June/July 2021.     She has no FH of MS.    Data reviewed: MRI brain 06/20/2020 showed T2/FLAIR hyperintense foci in the pons, left middle cerebellar peduncle and in the hemispheres in a pattern and configuration consistent with chronic demyelinating plaque associated with multiple sclerosis.  None of the foci appear to be acute.  Compared to the MRI from 11/07/2019, there do not appear to be any new lesions.  MRI cervical spine 06/20/2020 showed T2 hyperintense foci within the spinal cord posteriorly adjacent to C3-C4, to the left adjacent to T1 and adjacent to T2 consistent with  chronic demyelinating plaque associated with multiple sclerosis.  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  There are also very minimal stable disc bulges at C3-C4 and C5-C6.  There is no nerve root compression.  MRI thoracic spine 06/20/2020 showed Foci within the spinal cord adjacent to T1 and T3-T4 as detailed above.  A third focus adjacent to T2 was noted on the cervical spine images but not clearly apparent on the thoracic spine images.  All 3 of these foci were present on MRIs from 11/07/2019 and all consistent with chronic demyelinating plaque associated with multiple sclerosis.  There are no new lesions.  MRI of the cervical and thoracic spine 11/07/2019 showed enhancing foci at  C3C4 posteriorly to the left and T1 and non-enhancing foci centrally at C1-C2, T2 centrally and T3T4 towards the right.   Brain MRI showed scattered foci in the MCP's, left cerebellar hemisphere, pons, left thalamus and periventricular and juxtacortical white matter.   Several foci in the hemispheres and in the left middle cerebellar peduncle enhanced after contrast.    MRI of the brain and orbits 11/22/2019 showed an additional enhancing lesion in the left posterior lateral pons.    Orbits were normal.    Labs showed negative HIV and NMO Ab.   Vit D was 11 (now being supplemented).   REVIEW OF SYSTEMS: Constitutional: No fevers, chills, sweats, or change in appetite.  She has fatigue. Eyes: No visual changes, double vision, eye pain Ear, nose and throat: No hearing loss, ear pain, nasal congestion, sore throat Cardiovascular: No chest pain, palpitations Respiratory:  No shortness of breath at rest or with exertion.   No wheezes GastrointestinaI: No nausea, vomiting, diarrhea, abdominal pain, fecal incontinence Genitourinary:   Urinary urgency and frequency. Musculoskeletal:  No neck pain, back pain Integumentary: No rash, pruritus, skin lesions Neurological: as above Psychiatric: Anxiety and mild depression. Endocrine: She is a type I diabetic. Hematologic/Lymphatic:  No anemia, purpura, petechiae. Allergic/Immunologic: No itchy/runny eyes, nasal congestion, recent allergic reactions, rashes  ALLERGIES: Allergies  Allergen Reactions   Enalapril Other (See Comments)    "Weak and blacked out" couldn't stand up. Pt states it dropped her BP too low    Morphine And Related Rash    HOME MEDICATIONS:  Current Outpatient Medications:    acetaminophen (TYLENOL) 325 MG tablet, Take 325 mg by mouth every 6 (six) hours as needed for mild pain., Disp: , Rfl:    amphetamine-dextroamphetamine (ADDERALL) 10 MG tablet, Take 1 tablet (10 mg total) by mouth 2 (two) times daily with a meal., Disp: 60  tablet, Rfl: 0   busPIRone (BUSPAR) 15 MG tablet, TAKE 1 TABLET BY MOUTH TWICE DAILY., Disp: 180 tablet, Rfl: 1   Cholecalciferol (DIALYVITE VITAMIN D 5000) 125 MCG (5000 UT) capsule, Take 5,000 Units by mouth daily., Disp: , Rfl:    escitalopram (LEXAPRO) 20 MG tablet, TAKE 1 TABLET BY MOUTH DAILY, Disp: 30 tablet, Rfl: 1   ibuprofen (ADVIL) 200 MG tablet, Take 200 mg by mouth every 6 (six) hours as needed for moderate pain., Disp: , Rfl:    insulin aspart (NOVOLOG) 100 UNIT/ML injection, Inject 10-23 Units into the skin 3 (three) times daily before meals., Disp: , Rfl:    insulin degludec (TRESIBA FLEXTOUCH) 200 UNIT/ML FlexTouch Pen, Inject 40 Units into the skin 2 (two) times daily., Disp: , Rfl:    meclizine (ANTIVERT) 25  MG tablet, Take 1 tablet (25 mg total) by mouth 3 (three) times daily as needed for dizziness., Disp: 90 tablet, Rfl: 0   natalizumab (TYSABRI) 300 MG/15ML injection, Inject 300 mg as directed every 30 (thirty) days., Disp: , Rfl:    ondansetron (ZOFRAN ODT) 4 MG disintegrating tablet, Take 1 tablet (4 mg total) by mouth every 8 (eight) hours as needed. (Patient taking differently: Take 4 mg by mouth every 8 (eight) hours as needed for nausea or vomiting.), Disp: 20 tablet, Rfl: 6   Semaglutide,0.25 or 0.5MG /DOS, (OZEMPIC, 0.25 OR 0.5 MG/DOSE,) 2 MG/1.5ML SOPN, Inject 0.5 mg into the skin every Tuesday., Disp: , Rfl:    TOUJEO MAX SOLOSTAR 300 UNIT/ML Solostar Pen, Inject 40 Units into the skin 2 (two) times daily., Disp: 3 mL, Rfl: 0  PAST MEDICAL HISTORY: Past Medical History:  Diagnosis Date   Heart murmur    as a child   Hypoglycemia associated with diabetes (HCC)    Multiple sclerosis (HCC)    Obesity, morbid (HCC)    Pancreatitis    Tachycardia    Type 1 diabetes mellitus not at goal [facility])    since age of 25 years old    PAST SURGICAL HISTORY: Past Surgical History:  Procedure Laterality Date   CESAREAN SECTION N/A 03/17/2016   Procedure: CESAREAN SECTION;   Surgeon: Marlow Baars, MD;  Location: [facility] BIRTHING SUIT[facility];  Service: Obstetrics;  Laterality: N/A;  REQUEST RNFA   CESAREAN SECTION N/A 08/06/2018   Procedure: CESAREAN SECTION;  Surgeon: Marlow Baars, MD;  Location: [facility] BIRTHING SUIT[facility];  Service: Obstetrics;  Laterality: N/A;  RNFA AVAILABLE   CHOLECYSTECTOMY     IR IMAGING GUIDED PORT INSERTION  11/02/2020    FAMILY HISTORY: Family History  Problem Relation Age of Onset   Thyroid disease Mother    Obesity Mother    Diabetes Father    Obesity Father    Obesity Sister    Cancer Maternal Grandmother    Obesity Paternal Grandfather     SOCIAL HISTORY:  Social History   Socioeconomic History   Marital status: Married    Spouse name: Not on file   Number of children: Not on file   Years of education: Not on file   Highest education level: Not on file  Occupational History   Not on file  Tobacco Use   Smoking status: Never   Smokeless tobacco: Never  Vaping Use   Vaping Use: Never used  Substance and Sexual Activity   Alcohol use: No    Alcohol/week: 0.0 standard drinks   Drug use: No   Sexual activity: Yes    Birth control/protection: None  Other Topics Concern   Not on file  Social History Narrative   Right handed   Caffeine use: none   Newly married   [employer] - online   Works part time at a daycare   Works at a [employer] time   Social Determinants of Corporate investment banker Strain: Not on [employer] Insecurity: Not on file  Transportation Needs: Not on file  Physical Activity: Not on file  Stress: Not on file  Social Connections: Not on file  Intimate Partner Violence: Not on file     PHYSICAL EXAM  Vitals:   02/14/21 1404  BP: 121/78  Pulse: (!) 104  SpO2: 96%  Weight: 244 lb 8 oz (110.9 kg)  Height: 5\' 4"  (1.626 m)    Body mass index is 41.97 kg/m.  No results found.   General: The patient is well-developed and well-nourished and in no acute distress  HEENT:  Head  is [place]/AT.  Sclera are anicteric.     Skin: Extremities are without rash or edema.   Neurologic Exam  Mental status: The patient is alert and oriented x 3 at the time of the examination.  Speech is normal.  Cranial nerves: Extraocular movements are full.   Facial symmetry is present. She reports reduced left facial sensation to soft touch and temperature.    .Facial strength is normal.  Trapezius and sternocleidomastoid strength is normal. No dysarthria is noted.   No obvious hearing deficits are noted.  Motor:  Muscle bulk is normal.   Tone is normal. Strength is  5 / 5 proximally but reduced RAM left hand and mild reduced grip intrinsic hand muscles, left > right.    There is some giveway.   Sensory: She reports reduced touch, temperature and vibration sensation in the left arm    She reports more symmetric le sensation   Coordination: Cerebellar testing reveals good finger-nose-finger right and slightly reduced heel-to-shin bilaterally.  Gait and station: Station is normal.   Gait is mildly wide and tandem is wide. Minimal left foot drop.  Romberg is borderline positive.   Reflexes: Deep tendon reflexes are symmetric and normal bilaterally.        DIAGNOSTIC DATA (LABS, IMAGING, TESTING) - I reviewed patient records, labs, notes, testing and imaging myself where available.  Lab Results  Component Value Date   WBC 7.9 09/24/2020   HGB 13.9 09/24/2020   HCT 43.2 09/24/2020   MCV 85 09/24/2020   PLT 357 09/24/2020      Component Value Date/Time   NA 139 02/23/2020 0436   K 4.2 02/23/2020 0436   CL 111 02/23/2020 0436   CO2 19 (L) 02/23/2020 0436   GLUCOSE 232 (H) 02/23/2020 0436   BUN 15 02/23/2020 0436   CREATININE 0.58 02/23/2020 0436   CREATININE 0.62 03/22/2015 1210   CALCIUM 8.3 (L) 02/23/2020 0436   PROT 6.2 09/24/2020 1345   ALBUMIN 4.0 09/24/2020 1345   AST 14 09/24/2020 1345   ALT 16 09/24/2020 1345   ALKPHOS 123 (H) 09/24/2020 1345   BILITOT 0.3 09/24/2020  1345   GFRNONAA >60 02/23/2020 0436   GFRNONAA >89 03/22/2015 1210   GFRAA >60 02/23/2020 0436   GFRAA >89 03/22/2015 1210   Lab Results  Component Value Date   CHOL 160 03/22/2015   HDL 35.30 (L) 03/22/2015   LDLCALC 95 03/22/2015   TRIG 151.0 (H) 03/22/2015   CHOLHDL 5 03/22/2015   Lab Results  Component Value Date   HGBA1C 10.0 (H) 02/21/2020   Lab Results  Component Value Date   VITAMINB12 229 11/07/2019   Lab Results  Component Value Date   TSH 2.716 11/07/2019       ASSESSMENT AND PLAN  Multiple sclerosis (HCC)  High risk medication use  Gait disturbance  Numbness  Type 1 diabetes mellitus without complication (HCC)  Attention deficit  Other fatigue  1.   Continue Tysabri   Check  JCV Ab and other labs .    2.   She continues to be unable to work due to reduced gait and left > right arm impairments.     3.   Continue Adderall for weight loss, ADD and sleepiness/fatigue 4.   Increase Buspar to 30 mg po bid 5.   rtc 5 months, sooner if new or  worsening neurologic function   Yajayra Feldt A. Epimenio Foot, MD, [facility] 02/14/2021, 3:07 PM Certified in Neurology, Clinical Neurophysiology, Sleep Medicine and Neuroimaging  [facility] [address] [place], [place] [address] [phone]

## 2021-03-05 ENCOUNTER — Telehealth: Payer: Self-pay | Admitting: *Deleted

## 2021-03-05 DIAGNOSIS — Z0289 Encounter for other administrative examinations: Secondary | ICD-10-CM

## 2021-03-05 NOTE — Telephone Encounter (Signed)
[employer] form in [employer] features editor.

## 2021-03-07 ENCOUNTER — Telehealth: Payer: Self-pay | Admitting: *Deleted

## 2021-03-07 NOTE — Telephone Encounter (Signed)
Forms have been given to Stanton Kidney in medical records

## 2021-03-07 NOTE — Telephone Encounter (Signed)
Pt [place] form faxed 03/08/21

## 2021-03-08 NOTE — Telephone Encounter (Signed)
[employer] called to confirm and follow up on turn around time.  Contact info: [phone], Ext: [phone] Fax; [phone]  Informed caller of previous telephone note

## 2021-03-12 ENCOUNTER — Telehealth: Payer: Self-pay | Admitting: *Deleted

## 2021-03-12 NOTE — Telephone Encounter (Signed)
I re faxed pt [place] form on 03/12/21 to [phone]

## 2021-03-13 ENCOUNTER — Telehealth: Payer: Self-pay | Admitting: *Deleted

## 2021-03-13 ENCOUNTER — Telehealth: Payer: 59 | Admitting: Physician Assistant

## 2021-03-13 ENCOUNTER — Telehealth: Payer: 59 | Admitting: Family

## 2021-03-13 ENCOUNTER — Encounter (INDEPENDENT_AMBULATORY_CARE_PROVIDER_SITE_OTHER): Payer: Self-pay

## 2021-03-13 DIAGNOSIS — U071 COVID-19: Secondary | ICD-10-CM

## 2021-03-13 MED ORDER — FLUTICASONE PROPIONATE 50 MCG/ACT NA SUSP: 2.0000 | Freq: Every day | NASAL | 6 refills | Status: DC

## 2021-03-13 MED ORDER — MOLNUPIRAVIR EUA 200MG CAPSULE: 4.0000 | ORAL_CAPSULE | Freq: Two times a day (BID) | ORAL | 0 refills | Status: AC

## 2021-03-13 MED ORDER — ALBUTEROL SULFATE HFA 108 (90 BASE) MCG/ACT IN AERS: 2.0000 | INHALATION_SPRAY | Freq: Four times a day (QID) | RESPIRATORY_TRACT | 0 refills | Status: DC | PRN

## 2021-03-13 MED ORDER — BENZONATATE 100 MG PO CAPS: 100.0000 mg | ORAL_CAPSULE | Freq: Three times a day (TID) | ORAL | 0 refills | Status: DC | PRN

## 2021-03-13 NOTE — Progress Notes (Signed)
Virtual Visit Consent   Danielle Baker, you are scheduled for a virtual visit with a [facility] provider today.     Just as with appointments in the office, your consent must be obtained to participate.  Your consent will be active for this visit and any virtual visit you may have with one of our providers in the next 365 days.     If you have a MyChart account, a copy of this consent can be sent to you electronically.  All virtual visits are billed to your insurance company just like a traditional visit in the office.    As this is a virtual visit, video technology does not allow for your provider to perform a traditional examination.  This may limit your provider's ability to fully assess your condition.  If your provider identifies any concerns that need to be evaluated in person or the need to arrange testing (such as labs, EKG, etc.), we will make arrangements to do so.     Although advances in technology are sophisticated, we cannot ensure that it will always work on either your end or our end.  If the connection with a video visit is poor, the visit may have to be switched to a telephone visit.  With either a video or telephone visit, we are not always able to ensure that we have a secure connection.     I need to obtain your verbal consent now.   Are you willing to proceed with your visit today?    DEBBIE YEARICK has provided verbal consent on 03/13/2021 for a virtual visit (video or telephone).   Piedad Climes, [place]   Date: 03/13/2021 6:28 PM   Virtual Visit via Video Note   I, Piedad Climes, connected with  ASHANI PUMPHREY  ([id number], [birth year]) on 03/13/21 at  6:15 PM EDT by a video-enabled telemedicine application and verified that I am speaking with the correct person using two identifiers.  Location: Patient: Virtual Visit Location Patient: Home Provider: Virtual Visit Location Provider: Home Office   I discussed the limitations of evaluation and management by  telemedicine and the availability of in person appointments. The patient expressed understanding and agreed to proceed.    History of Present Illness: Danielle Baker is a 25 y.o. who identifies as a female who was assigned female at birth, and is being seen today for COVID-19. She has just completed an e-visit at which time supportive measures and prescription medications for symptom management were given. Giving her chronic medical issues -- MS, palpitations, DM with hx DKA, Morbid Obesity, HTN -- it was recommended she have a video visit to discuss antiviral medications. Again patient endorses cough, congestion, sore throat and fatigue. Symptoms starting 2 days ago.  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   Exacerbation of multiple sclerosis (HCC) 02/21/2020   Sleepiness 01/04/2020   High risk medication use 11/28/2019   Numbness 11/28/2019   Gait disturbance 11/28/2019   Urinary urgency 11/28/2019   Vitamin D deficiency 11/28/2019   Multiple sclerosis (HCC) 11/07/2019   History of cesarean delivery 08/06/2018   Palpitations 03/30/2018   Maternal varicella, non-immune 01/04/2018   Pancreatitis 05/21/2016   Diabetes mellitus complicating pregnancy in third trimester, antepartum 03/18/2016   DKA (diabetic ketoacidoses) 08/31/2013   Childhood  obesity 07/08/2012   Obesity, morbid (HCC)    Tachycardia    Hypertension associated with diabetes (HCC)    Type 1 diabetes mellitus without complication (HCC) 12/16/2010    Allergies:  Allergies  Allergen Reactions   Morphine And Related Rash   Medications:  Current Outpatient Medications:    molnupiravir EUA 200 mg CAPS, Take 4 capsules (800 mg total) by mouth 2 (two) times daily for 5 days., Disp: 40 capsule, Rfl: 0   acetaminophen (TYLENOL) 325 MG tablet, Take 325  mg by mouth every 6 (six) hours as needed for mild pain., Disp: , Rfl:    albuterol (VENTOLIN HFA) 108 (90 Base) MCG/ACT inhaler, Inhale 2 puffs into the lungs every 6 (six) hours as needed for wheezing or shortness of breath., Disp: 8 g, Rfl: 0   amphetamine-dextroamphetamine (ADDERALL) 10 MG tablet, Take 1 tablet (10 mg total) by mouth 2 (two) times daily with a meal., Disp: 60 tablet, Rfl: 0   benzonatate (TESSALON PERLES) 100 MG capsule, Take 1 capsule (100 mg total) by mouth 3 (three) times daily as needed., Disp: 20 capsule, Rfl: 0   busPIRone (BUSPAR) 30 MG tablet, Take 1 tablet (30 mg total) by mouth 2 (two) times daily., Disp: 180 tablet, Rfl: 3   Cholecalciferol (DIALYVITE VITAMIN D 5000) 125 MCG (5000 UT) capsule, Take 5,000 Units by mouth daily., Disp: , Rfl:    escitalopram (LEXAPRO) 20 MG tablet, TAKE 1 TABLET BY MOUTH DAILY, Disp: 30 tablet, Rfl: 1   fluticasone (FLONASE) 50 MCG/ACT nasal spray, Place 2 sprays into both nostrils daily., Disp: 16 g, Rfl: 6   ibuprofen (ADVIL) 200 MG tablet, Take 200 mg by mouth every 6 (six) hours as needed for moderate pain., Disp: , Rfl:    insulin aspart (NOVOLOG) 100 UNIT/ML injection, Inject 10-23 Units into the skin 3 (three) times daily before meals., Disp: , Rfl:    insulin degludec (TRESIBA FLEXTOUCH) 200 UNIT/ML FlexTouch Pen, Inject 40 Units into the skin 2 (two) times daily., Disp: , Rfl:    meclizine (ANTIVERT) 25 MG tablet, Take 1 tablet (25 mg total) by mouth 3 (three) times daily as needed for dizziness., Disp: 90 tablet, Rfl: 0   natalizumab (TYSABRI) 300 MG/15ML injection, Inject 300 mg as directed every 30 (thirty) days., Disp: , Rfl:    ondansetron (ZOFRAN ODT) 4 MG disintegrating tablet, Take 1 tablet (4 mg total) by mouth every 8 (eight) hours as needed., Disp: 20 tablet, Rfl: 6   Semaglutide,0.25 or 0.5MG /DOS, (OZEMPIC, 0.25 OR 0.5 MG/DOSE,) 2 MG/1.5ML SOPN, Inject 0.5 mg into the skin every Tuesday., Disp: , Rfl:    TOUJEO MAX  SOLOSTAR 300 UNIT/ML Solostar Pen, Inject 40 Units into the skin 2 (two) times daily., Disp: 3 mL, Rfl: 0   zonisamide (ZONEGRAN) 100 MG capsule, Take 1 capsule (100 mg total) by mouth at bedtime., Disp: 90 capsule, Rfl: 1  Observations/Objective: Patient is well-developed, well-nourished in no acute distress.  Resting comfortably at home.  Head is normocephalic, atraumatic.  No labored breathing. Speech is clear and coherent with logical content.  Patient is alert and oriented at baseline.   Assessment and Plan: 1. COVID-19 - molnupiravir EUA 200 mg CAPS; Take 4 capsules (800 mg total) by mouth 2 (two) times daily for 5 days.  Dispense: 40 capsule; Refill: 0 Patient with multiple risk factors for complicated course of illness. Discussed risks/benefits of antiviral medications including most common potential ADRs. Patient voiced understanding and would like to proceed  with antiviral medication. They are candidate for molnupiravir. Rx sent to pharmacy. Supportive measures, OTC medications and vitamin regimen reviewed. Continue use of Rx medications given at time of E-visit earlier today. Patient has been enrolled in a MyChart COVID symptom monitoring program. Anne Shutter reviewed in detail. Strict ER precautions discussed with patient.    Follow Up Instructions: I discussed the assessment and treatment plan with the patient. The patient was provided an opportunity to ask questions and all were answered. The patient agreed with the plan and demonstrated an understanding of the instructions.  A copy of instructions were sent to the patient via MyChart.  The patient was advised to call back or seek an in-person evaluation if the symptoms worsen or if the condition fails to improve as anticipated.  Time:  I spent 13 minutes with the patient via telehealth technology discussing the above problems/concerns.    Piedad Climes, PA-C

## 2021-03-13 NOTE — Telephone Encounter (Signed)
BPA triggered due to worsening cough. Pt states "Just a little worse" States called PCP and was prescribed cough medicine and the anti viral medication. States "I think I'm on my way to feeling better." No additional advise requested.

## 2021-03-13 NOTE — Telephone Encounter (Signed)
Addressed in TE.

## 2021-03-13 NOTE — Progress Notes (Signed)
E-Visit  for Positive Covid Test Result  We are sorry you are not feeling well. We are here to help!  You have tested positive for COVID-19, meaning that you were infected with the novel coronavirus and could give the virus to others.  It is vitally important that you stay home so you do not spread it to others.      Please continue isolation at home, for at least 10 days since the start of your symptoms and until you have had 24 hours with no fever (without taking a fever reducer) and with improving of symptoms.  If you have no symptoms but tested positive (or all symptoms resolve after 5 days and you have no fever) you can leave your house but continue to wear a mask around others for an additional 5 days. If you have a fever,continue to stay home until you have had 24 hours of no fever. Most cases improve 5-10 days from onset but we have seen a small number of patients who have gotten worse after the 10 days.  Please be sure to watch for worsening symptoms and remain taking the proper precautions.   Go to the nearest hospital ED for assessment if fever/cough/breathlessness are severe or illness seems like a threat to life.    The following symptoms may appear 2-14 days after exposure: Fever Cough Shortness of breath or difficulty breathing Chills Repeated shaking with chills Muscle pain Headache Sore throat New loss of taste or smell Fatigue Congestion or runny nose Nausea or vomiting Diarrhea  You have been enrolled in MyChart Home Monitoring for COVID-19. Daily you will receive a questionnaire within the MyChart website. Our COVID-19 response team will be monitoring your responses daily.  You can use medication such as prescription cough medication called Tessalon Perles 100 mg. You may take 1-2 capsules every 8 hours as needed for cough,  prescription inhaler called Albuterol MDI 90 mcg /actuation 2 puffs every 4 hours as needed for shortness of breath, wheezing, cough, and  prescription for Fluticasone nasal spray 2 sprays in each nostril one time per day.  I recommend antivirals given your chronic problems, however, to get these you must do a video visit.   You may also take acetaminophen (Tylenol) as needed for fever.  HOME CARE: Only take medications as instructed by your medical team. Drink plenty of fluids and get plenty of rest. A steam or ultrasonic humidifier can help if you have congestion.   GET HELP RIGHT AWAY IF YOU HAVE EMERGENCY WARNING SIGNS.  Call 911 or proceed to your closest emergency facility if: You develop worsening high fever. Trouble breathing Bluish lips or face Persistent pain or pressure in the chest New confusion Inability to wake or stay awake You cough up blood. Your symptoms become more severe Inability to hold down food or fluids  This list is not all possible symptoms. Contact your medical provider for any symptoms that are severe or concerning to you.    Your e-visit answers were reviewed by a board certified advanced clinical practitioner to complete your personal care plan.  Depending on the condition, your plan could have included both over the counter or prescription medications.  If there is a problem please reply once you have received a response from your provider.  Your safety is important to [place].  If you have drug allergies check your prescription carefully.    You can use MyChart to ask questions about today's visit, request a non-urgent call  back, or ask for a work or school excuse for 24 hours related to this e-Visit. If it has been greater than 24 hours you will need to follow up with your provider, or enter a new e-Visit to address those concerns. You will get an e-mail in the next two days asking about your experience.  I hope that your e-visit has been valuable and will speed your recovery. Thank you for using e-visits.   Approximately 5 minutes was spent documenting and reviewing patient's chart.

## 2021-03-13 NOTE — Patient Instructions (Signed)
Danielle Baker, thank you for joining Piedad Climes, PA-C for today's virtual visit.  While this provider is not your primary care provider (PCP), if your PCP is located in our provider database this encounter information will be shared with them immediately following your visit.  Consent: (Patient) Danielle Baker provided verbal consent for this virtual visit at the beginning of the encounter.  Current Medications:  Current Outpatient Medications:    acetaminophen (TYLENOL) 325 MG tablet, Take 325 mg by mouth every 6 (six) hours as needed for mild pain., Disp: , Rfl:    albuterol (VENTOLIN HFA) 108 (90 Base) MCG/ACT inhaler, Inhale 2 puffs into the lungs every 6 (six) hours as needed for wheezing or shortness of breath., Disp: 8 g, Rfl: 0   amphetamine-dextroamphetamine (ADDERALL) 10 MG tablet, Take 1 tablet (10 mg total) by mouth 2 (two) times daily with a meal., Disp: 60 tablet, Rfl: 0   benzonatate (TESSALON PERLES) 100 MG capsule, Take 1 capsule (100 mg total) by mouth 3 (three) times daily as needed., Disp: 20 capsule, Rfl: 0   busPIRone (BUSPAR) 30 MG tablet, Take 1 tablet (30 mg total) by mouth 2 (two) times daily., Disp: 180 tablet, Rfl: 3   Cholecalciferol (DIALYVITE VITAMIN D 5000) 125 MCG (5000 UT) capsule, Take 5,000 Units by mouth daily., Disp: , Rfl:    escitalopram (LEXAPRO) 20 MG tablet, TAKE 1 TABLET BY MOUTH DAILY, Disp: 30 tablet, Rfl: 1   fluticasone (FLONASE) 50 MCG/ACT nasal spray, Place 2 sprays into both nostrils daily., Disp: 16 g, Rfl: 6   ibuprofen (ADVIL) 200 MG tablet, Take 200 mg by mouth every 6 (six) hours as needed for moderate pain., Disp: , Rfl:    insulin aspart (NOVOLOG) 100 UNIT/ML injection, Inject 10-23 Units into the skin 3 (three) times daily before meals., Disp: , Rfl:    insulin degludec (TRESIBA FLEXTOUCH) 200 UNIT/ML FlexTouch Pen, Inject 40 Units into the skin 2 (two) times daily., Disp: , Rfl:    meclizine (ANTIVERT) 25 MG tablet, Take 1  tablet (25 mg total) by mouth 3 (three) times daily as needed for dizziness., Disp: 90 tablet, Rfl: 0   natalizumab (TYSABRI) 300 MG/15ML injection, Inject 300 mg as directed every 30 (thirty) days., Disp: , Rfl:    ondansetron (ZOFRAN ODT) 4 MG disintegrating tablet, Take 1 tablet (4 mg total) by mouth every 8 (eight) hours as needed., Disp: 20 tablet, Rfl: 6   Semaglutide,0.25 or 0.5MG /DOS, (OZEMPIC, 0.25 OR 0.5 MG/DOSE,) 2 MG/1.5ML SOPN, Inject 0.5 mg into the skin every Tuesday., Disp: , Rfl:    TOUJEO MAX SOLOSTAR 300 UNIT/ML Solostar Pen, Inject 40 Units into the skin 2 (two) times daily., Disp: 3 mL, Rfl: 0   zonisamide (ZONEGRAN) 100 MG capsule, Take 1 capsule (100 mg total) by mouth at bedtime., Disp: 90 capsule, Rfl: 1   Medications ordered in this encounter:  No orders of the defined types were placed in this encounter.    *If you need refills on other medications prior to your next appointment, please contact your pharmacy*  Follow-Up: Call back or seek an in-person evaluation if the symptoms worsen or if the condition fails to improve as anticipated.  Other Instructions Please keep well-hydrated and get plenty of rest. Start a saline nasal rinse to flush out your nasal passages. You can use plain Mucinex to help thin congestion. If you have a humidifier, running in the bedroom at night. I want you to start OTC vitamin  D3 1000 units daily, vitamin C 1000 mg daily, and a zinc supplement. Please take prescribed medications as directed.  You have been enrolled in a MyChart symptom monitoring program. Please answer these questions daily so we can keep track of how you are doing.  You were to quarantine for 5 days from onset of your symptoms.  After day 5, if you have had no fever and you are feeling better, you can end quarantine but need to mask for an additional 5 days. After day 5 if you have a fever or are having significant symptoms, please quarantine for full 10 days.  If  you note any worsening of symptoms, any significant shortness of breath or any chest pain, please seek ER evaluation ASAP.  Please do not delay care!    If you have been instructed to have an in-person evaluation today at a local Urgent Care facility, please use the link below. It will take you to a list of all of our available [facility], including address, phone number and hours of operation. Please do not delay care.  [facility]  If you or a family member do not have a primary care provider, use the link below to schedule a visit and establish care. When you choose a [facility] primary care physician or advanced practice provider, you gain a long-term partner in health. Find a Primary Care Provider  Learn more about [facility] in-office and virtual care options: [facility] - Get Care Now

## 2021-03-17 ENCOUNTER — Other Ambulatory Visit: Payer: Self-pay

## 2021-03-18 MED ORDER — AMPHETAMINE-DEXTROAMPHETAMINE 10 MG PO TABS: 10.0000 mg | ORAL_TABLET | Freq: Two times a day (BID) | ORAL | 0 refills | Status: DC

## 2021-03-25 ENCOUNTER — Telehealth: Payer: Self-pay | Admitting: Neurology

## 2021-03-25 ENCOUNTER — Encounter: Payer: Self-pay | Admitting: Neurology

## 2021-03-25 ENCOUNTER — Other Ambulatory Visit: Payer: Self-pay | Admitting: *Deleted

## 2021-03-25 ENCOUNTER — Other Ambulatory Visit (INDEPENDENT_AMBULATORY_CARE_PROVIDER_SITE_OTHER): Payer: Self-pay

## 2021-03-25 DIAGNOSIS — G35 Multiple sclerosis: Secondary | ICD-10-CM

## 2021-03-25 DIAGNOSIS — Z0289 Encounter for other administrative examinations: Secondary | ICD-10-CM

## 2021-03-25 DIAGNOSIS — Z79899 Other long term (current) drug therapy: Secondary | ICD-10-CM

## 2021-03-25 NOTE — Telephone Encounter (Signed)
Placed JCV lab in [facility] lock box for routine lab pick up. Results pending. 

## 2021-03-26 LAB — CBC WITH DIFFERENTIAL/PLATELET
Basophils Absolute: 0.1 10*3/uL (ref 0.0–0.2)
Basos: 1 %
EOS (ABSOLUTE): 0.6 10*3/uL — ABNORMAL HIGH (ref 0.0–0.4)
Eos: 5 %
Hematocrit: 38.8 % (ref 34.0–46.6)
Hemoglobin: 12.5 g/dL (ref 11.1–15.9)
Immature Grans (Abs): 0.1 10*3/uL (ref 0.0–0.1)
Immature Granulocytes: 1 %
Lymphocytes Absolute: 4 10*3/uL — ABNORMAL HIGH (ref 0.7–3.1)
Lymphs: 32 %
MCH: 25.5 pg — ABNORMAL LOW (ref 26.6–33.0)
MCHC: 32.2 g/dL (ref 31.5–35.7)
MCV: 79 fL (ref 79–97)
Monocytes Absolute: 0.6 10*3/uL (ref 0.1–0.9)
Monocytes: 5 %
Neutrophils Absolute: 7.1 10*3/uL — ABNORMAL HIGH (ref 1.4–7.0)
Neutrophils: 56 %
Platelets: 320 10*3/uL (ref 150–450)
RBC: 4.91 x10E6/uL (ref 3.77–5.28)
RDW: 14.8 % (ref 11.7–15.4)
WBC: 12.5 10*3/uL — ABNORMAL HIGH (ref 3.4–10.8)

## 2021-04-01 NOTE — Telephone Encounter (Signed)
JCV ab drawn on 03/25/21 positive, index: 0.86. Gave to MD for review.

## 2021-04-05 ENCOUNTER — Telehealth: Payer: Self-pay | Admitting: Neurology

## 2021-04-05 DIAGNOSIS — G35 Multiple sclerosis: Secondary | ICD-10-CM

## 2021-04-05 IMAGING — DX DG SACRUM/COCCYX 2+V
3 series · 3 of 3 positions shown · non-contrast
Comparison: CT abdomen pelvis dated September 15, 2017.

CLINICAL DATA: Tailbone pain after fall.

EXAM:
SACRUM AND COCCYX - 2+ VIEW

[sacrum orthogonal ap]
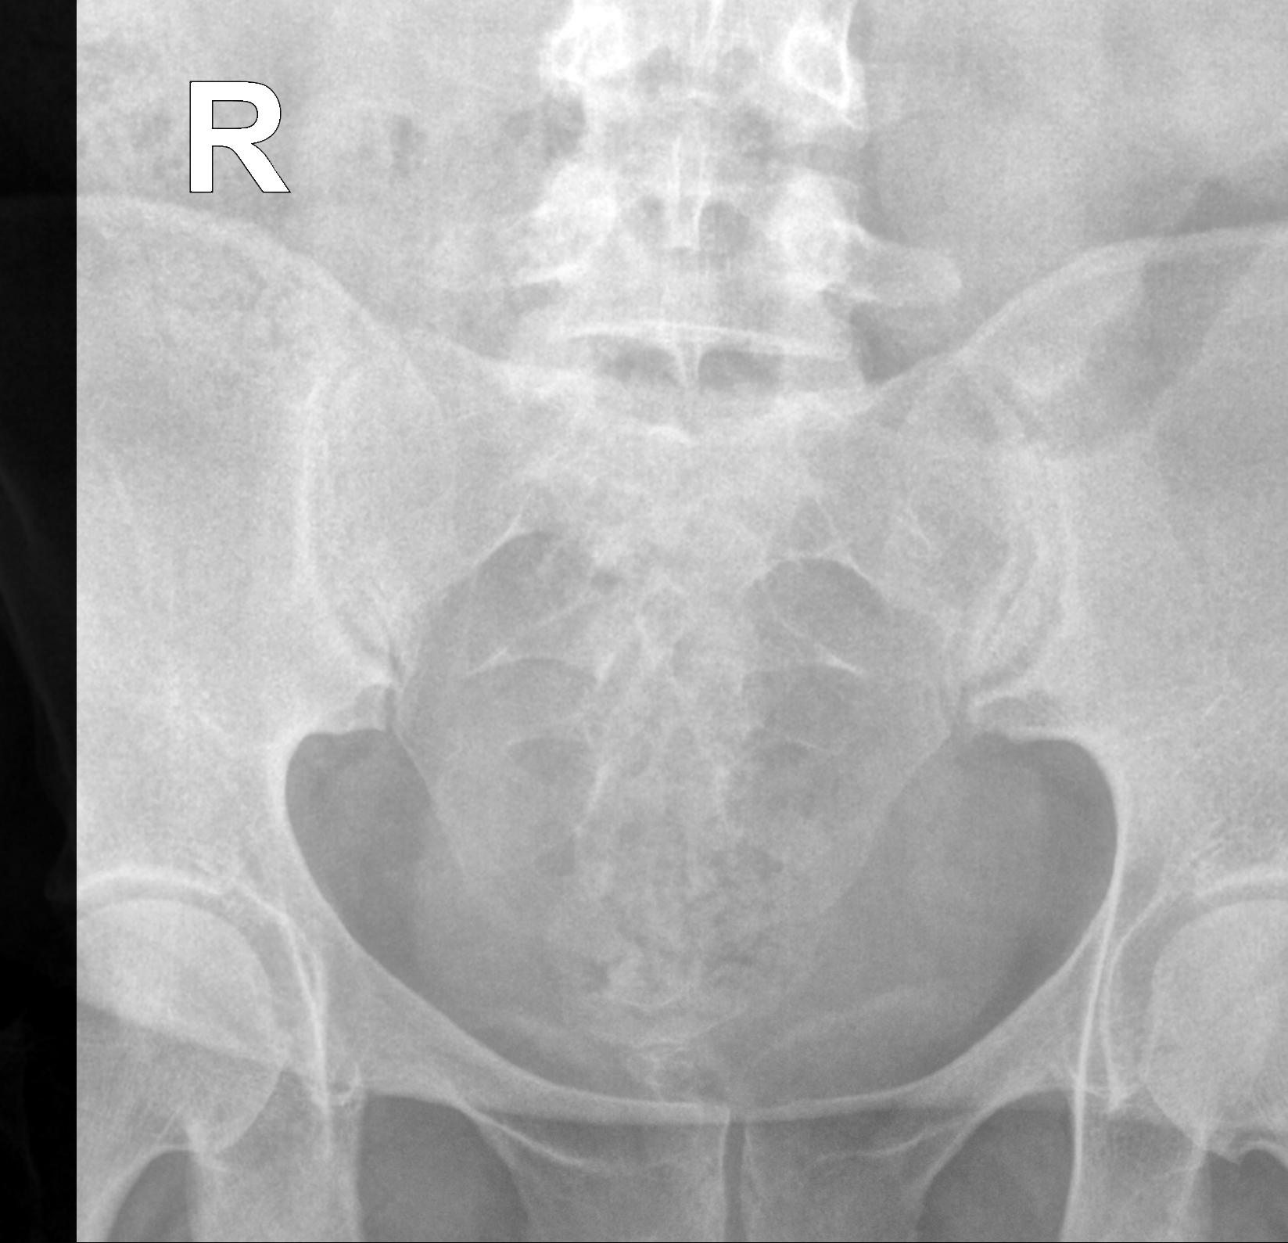

[coccyx 20° cranio-caudal ap]
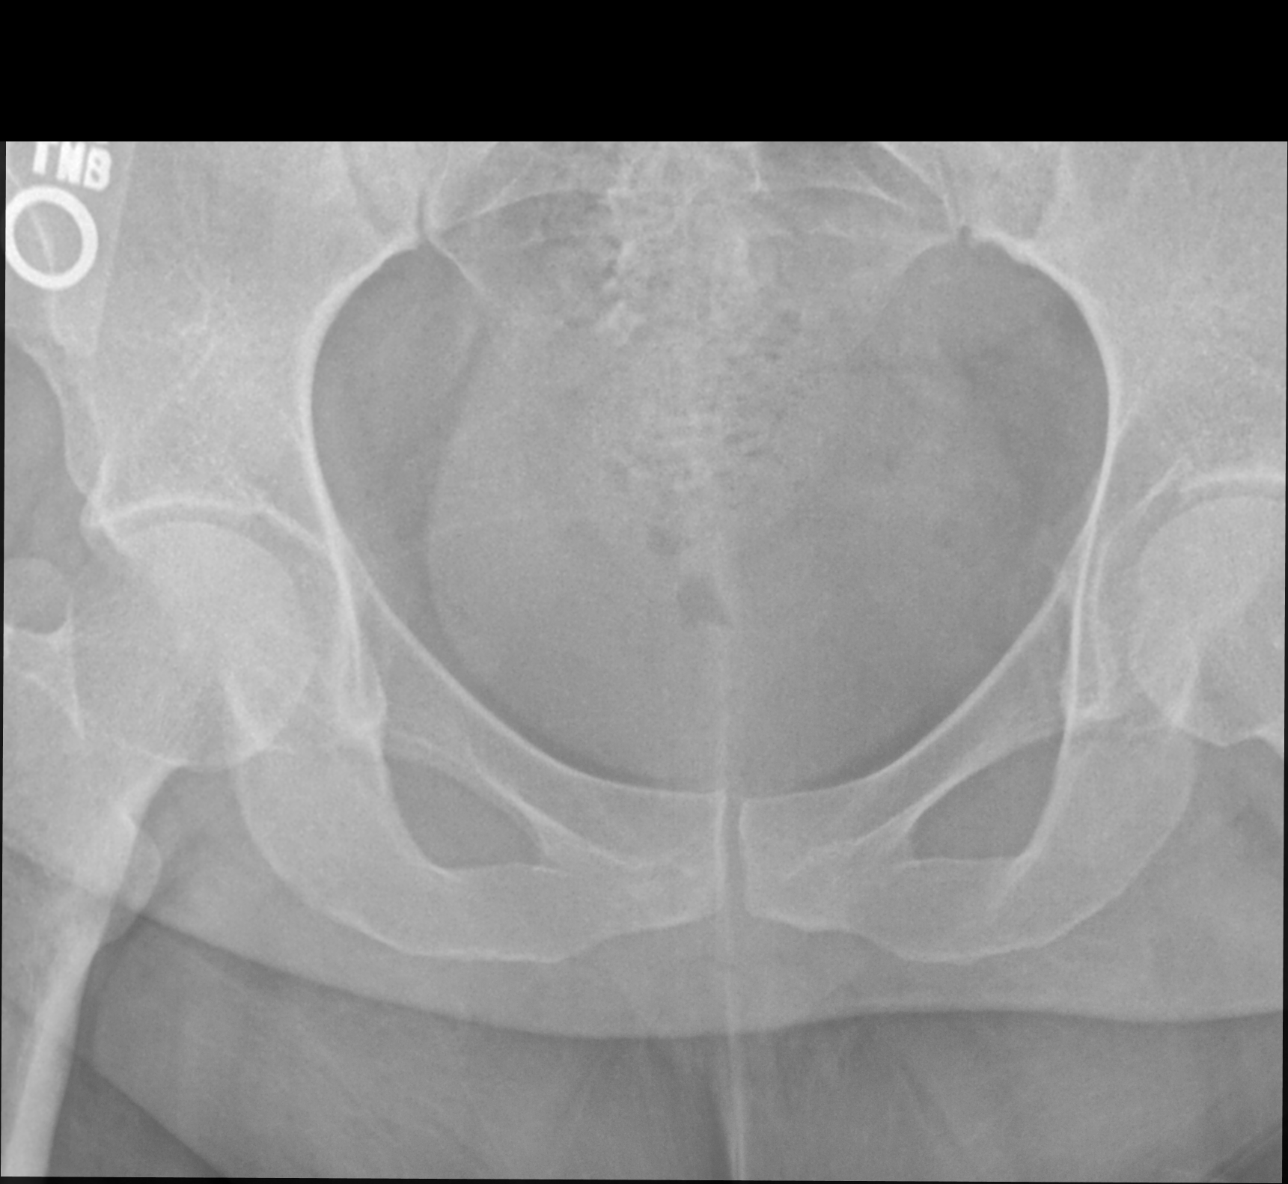

[sacrum lat]
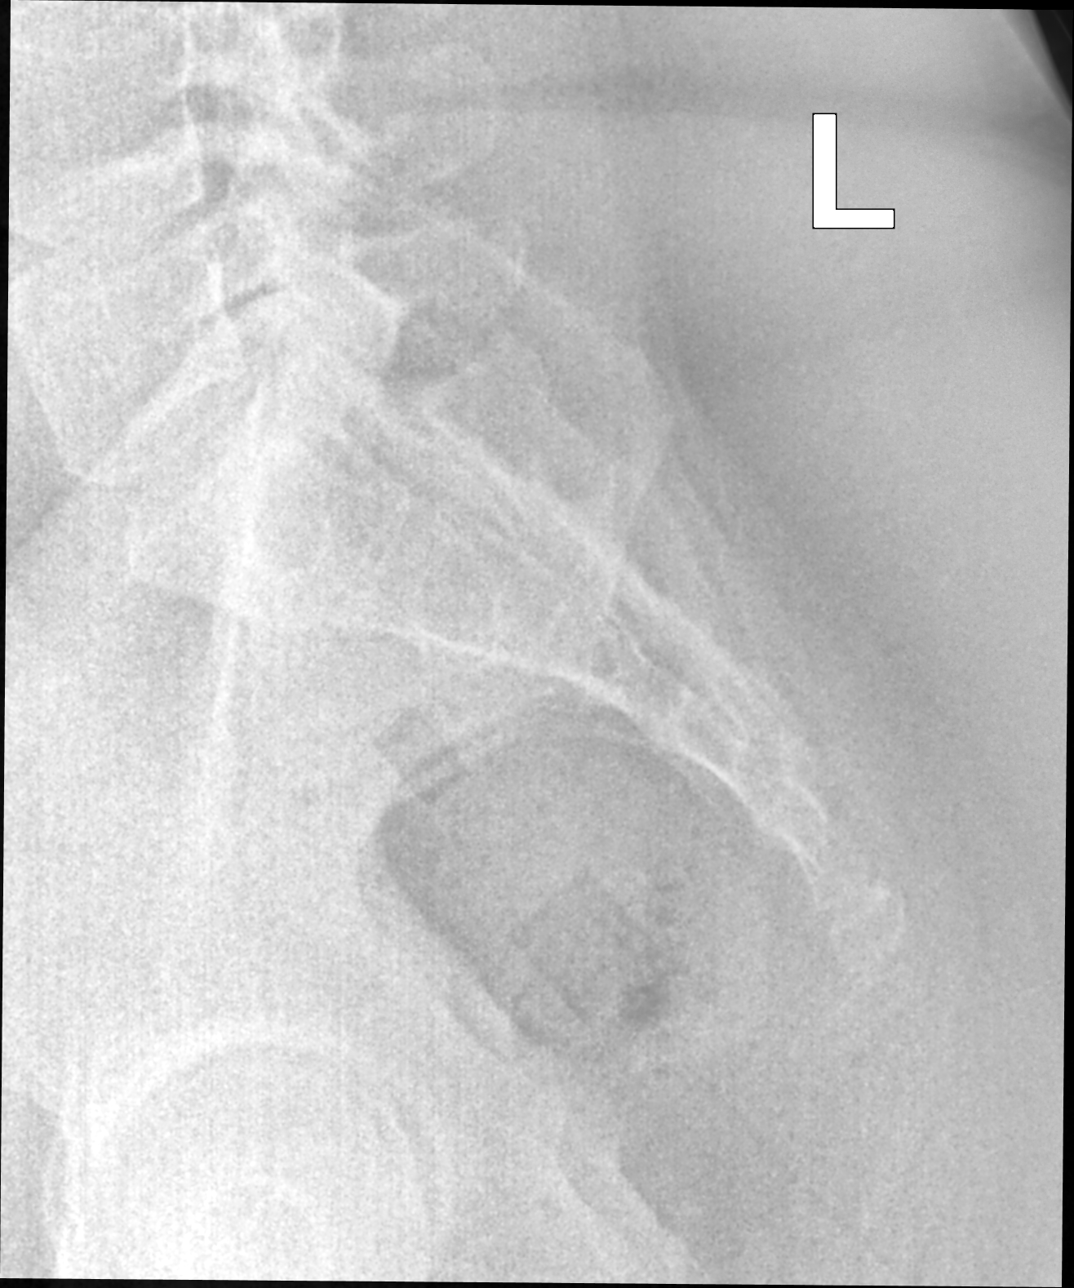

[3 of 3 positions shown; findings below may reference images not displayed]

FINDINGS: There is no evidence of fracture or other focal bone lesions.
IMPRESSION: Negative.

## 2021-04-05 NOTE — Telephone Encounter (Signed)
Her JCV antibody has increased further to 0.86.  This is still in the low positive range.  I discussed this with her and went over the risks and benefits of Tysabri.  Some studies suggest that extending the dose of Tysabri (i.e. 6 weeks) might reduce the risk of PML.  We could also consider a different disease modifying therapy.  She is agreeable to extending the dosing interval to 6 weeks for her Tysabri.  I will place a future order for the JCV antibody and she will check this when she comes in for the infusion immediately before her visit with me in December

## 2021-04-08 NOTE — Telephone Encounter (Signed)
Printed message and provided to [facility] so they can update their records.

## 2021-04-25 ENCOUNTER — Other Ambulatory Visit: Payer: Self-pay

## 2021-04-29 ENCOUNTER — Other Ambulatory Visit: Payer: Self-pay

## 2021-04-29 MED ORDER — AMPHETAMINE-DEXTROAMPHETAMINE 10 MG PO TABS: 10.0000 mg | ORAL_TABLET | Freq: Two times a day (BID) | ORAL | 0 refills | Status: DC

## 2021-04-30 NOTE — Telephone Encounter (Signed)
Received refill request on adderall, was already done 04-29-21 to [facility]  [address] in [place], [place]

## 2021-06-10 ENCOUNTER — Other Ambulatory Visit: Payer: Self-pay | Admitting: Neurology

## 2021-06-11 MED ORDER — AMPHETAMINE-DEXTROAMPHETAMINE 10 MG PO TABS: 10.0000 mg | ORAL_TABLET | Freq: Two times a day (BID) | ORAL | 0 refills | Status: DC

## 2021-06-19 ENCOUNTER — Other Ambulatory Visit: Payer: Self-pay | Admitting: *Deleted

## 2021-06-19 ENCOUNTER — Telehealth: Payer: Self-pay | Admitting: *Deleted

## 2021-06-19 ENCOUNTER — Other Ambulatory Visit (INDEPENDENT_AMBULATORY_CARE_PROVIDER_SITE_OTHER): Payer: 59

## 2021-06-19 DIAGNOSIS — Z79899 Other long term (current) drug therapy: Secondary | ICD-10-CM

## 2021-06-19 DIAGNOSIS — G35 Multiple sclerosis: Secondary | ICD-10-CM

## 2021-06-19 DIAGNOSIS — Z0289 Encounter for other administrative examinations: Secondary | ICD-10-CM

## 2021-06-19 NOTE — Telephone Encounter (Signed)
Placed JCV lab in [facility] lock box for routine lab pick up. Results pending. 

## 2021-06-20 ENCOUNTER — Other Ambulatory Visit: Payer: Self-pay | Admitting: *Deleted

## 2021-06-20 DIAGNOSIS — F32A Depression, unspecified: Secondary | ICD-10-CM

## 2021-06-20 DIAGNOSIS — F419 Anxiety disorder, unspecified: Secondary | ICD-10-CM

## 2021-06-20 LAB — CBC WITH DIFFERENTIAL/PLATELET
Basophils Absolute: 0.1 10*3/uL (ref 0.0–0.2)
Basos: 1 %
EOS (ABSOLUTE): 1 10*3/uL — ABNORMAL HIGH (ref 0.0–0.4)
Eos: 10 %
Hematocrit: 42.1 % (ref 34.0–46.6)
Hemoglobin: 13.7 g/dL (ref 11.1–15.9)
Immature Grans (Abs): 0 10*3/uL (ref 0.0–0.1)
Immature Granulocytes: 0 %
Lymphocytes Absolute: 3.7 10*3/uL — ABNORMAL HIGH (ref 0.7–3.1)
Lymphs: 36 %
MCH: 27.4 pg (ref 26.6–33.0)
MCHC: 32.5 g/dL (ref 31.5–35.7)
MCV: 84 fL (ref 79–97)
Monocytes Absolute: 0.6 10*3/uL (ref 0.1–0.9)
Monocytes: 6 %
Neutrophils Absolute: 4.9 10*3/uL (ref 1.4–7.0)
Neutrophils: 47 %
Platelets: 346 10*3/uL (ref 150–450)
RBC: 5 x10E6/uL (ref 3.77–5.28)
RDW: 14.2 % (ref 11.7–15.4)
WBC: 10.4 10*3/uL (ref 3.4–10.8)

## 2021-06-26 NOTE — Telephone Encounter (Signed)
Referral sent to [facility]. Phone: [phone].

## 2021-06-27 NOTE — Telephone Encounter (Signed)
JCV index value:1.00 (High) and Antibody was positive. Will provide to Dr Epimenio Foot for review

## 2021-07-03 NOTE — Telephone Encounter (Signed)
Called the patient to advise of the lab results.  Informed the patient that Dr. Epimenio Foot would like to have her come in to discuss alternative treatment options.  Was able to get the patient worked in on a cancellation for 11/17 at 3 pm with check in of 2:30. Pt accepted.

## 2021-07-04 ENCOUNTER — Ambulatory Visit: Payer: 59 | Admitting: Neurology

## 2021-07-04 ENCOUNTER — Encounter: Payer: Self-pay | Admitting: Neurology

## 2021-07-04 VITALS — BP 144/95 | HR 106 | Ht 64.0 in | Wt 232.6 lb

## 2021-07-04 DIAGNOSIS — R269 Unspecified abnormalities of gait and mobility: Secondary | ICD-10-CM

## 2021-07-04 DIAGNOSIS — G35 Multiple sclerosis: Secondary | ICD-10-CM

## 2021-07-04 DIAGNOSIS — R5383 Other fatigue: Secondary | ICD-10-CM | POA: Diagnosis not present

## 2021-07-04 DIAGNOSIS — Z79899 Other long term (current) drug therapy: Secondary | ICD-10-CM | POA: Diagnosis not present

## 2021-07-04 NOTE — Progress Notes (Signed)
[facility]  PATIENT: Danielle Baker DOB: 07/28/1996  REFERRING DOCTOR OR PCP:   Abner Greenspan, MD (PCP) SOURCE: Patient, notes from both hospital admissions, imaging and laboratory reports, MRI images personally reviewed.  _________________________________   HISTORICAL  CHIEF COMPLAINT:  Chief Complaint  Patient presents with   Follow-up    New room, alone . Last seen 02/14/21. On Tysabri for MS. Last JCV 06/19/21 positive, index: 1.00. Here to discuss other DMT options.    HISTORY OF PRESENT ILLNESS:  Update 07/05/21:   She is on Tysabri and has tolerated it well with no exacerbation.  Unfortunately, she went from JCV Ab low positive at 0.52 to mid positive at 1.0 when checked last week.   We discussed her risk of PML.   Initially, she did Ocrevus but felt poorly on it.   She was very tired on Ocrevus so switched to Tysabri in March 2022.     Gait is about the same and she notes reduced balance and easy fatiguing while she walks..  On a good day without rest she can go much further now - abe to walk a mile.   She has been able to go to nursing school as she feels less tired.      She notes left arm and leg numbness that is stable.  The more painful dysesthesias resolved.    Meclizine has helped the vertigo and ondansetron helps the nausea.     Fatigue improved on Tysabri and with Adderall.   She has stable depression and anxiety. She is on Lexapro.  She sleeps well at night now that she no longer takes naps.    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    Vit D was low and she is taking 5000 U daily (was on 50000 weekly)     She does not have immunity by IgG but did have the vaccine x 2 shos and had chciken pox as a child.    She does have HepB immunity (was vaccinated).   MS History: She had the rapid onset of  left  arm and flank numbness on 11/07/2019, and to a lesser extent in the right arm.  In retrospect she had some dysesthetic sensation in her trunk 2 weeks earlier.    She went to [facility] and was admitted for IV Solumedrol.   Two weeks later, she had left visual changes  and more numbness bilaterally but more on her right side and also had left facial numbness.   She has a Lhermitte sign into the right leg with the second episode last week.    She received 3 days IV Solu-Medrol and noted improvement but feels the Lhermitte sign and numbness are worse since then.   She started Ocrevus June/July 2021.     She has no FH of MS.    Data reviewed: MRI brain 06/20/2020 showed T2/FLAIR hyperintense foci in the pons, left middle cerebellar peduncle and in the hemispheres in a pattern and configuration consistent with chronic demyelinating plaque associated with multiple sclerosis.  None of the foci appear to be acute.  Compared to the MRI from 11/07/2019, there do not appear to be any new lesions.  MRI cervical spine 06/20/2020 showed T2 hyperintense foci within the spinal cord posteriorly adjacent to C3-C4,  to the left adjacent to T1 and adjacent to T2 consistent with chronic demyelinating plaque associated with multiple sclerosis.  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  There are also very minimal stable disc bulges at C3-C4 and C5-C6.  There is no nerve root compression.  MRI thoracic spine 06/20/2020 showed Foci within the spinal cord adjacent to T1 and T3-T4 as detailed above.  A third focus adjacent to T2 was noted on the cervical spine images but not clearly apparent on the thoracic spine images.  All 3 of these foci were present on MRIs from 11/07/2019 and all consistent with chronic demyelinating plaque associated with multiple sclerosis.  There are no new lesions.  MRI of the cervical and thoracic spine 11/07/2019  showed enhancing foci at C3C4 posteriorly to the left and T1 and non-enhancing foci centrally at C1-C2, T2 centrally and T3T4 towards the right.   Brain MRI showed scattered foci in the MCP's, left cerebellar hemisphere, pons, left thalamus and periventricular and juxtacortical white matter.   Several foci in the hemispheres and in the left middle cerebellar peduncle enhanced after contrast.    MRI of the brain and orbits 11/22/2019 showed an additional enhancing lesion in the left posterior lateral pons.    Orbits were normal.    Labs showed negative HIV and NMO Ab.   Vit D was 11 (now being supplemented).   REVIEW OF SYSTEMS: Constitutional: No fevers, chills, sweats, or change in appetite.  She has fatigue. Eyes: No visual changes, double vision, eye pain Ear, nose and throat: No hearing loss, ear pain, nasal congestion, sore throat Cardiovascular: No chest pain, palpitations Respiratory:  No shortness of breath at rest or with exertion.   No wheezes GastrointestinaI: No nausea, vomiting, diarrhea, abdominal pain, fecal incontinence Genitourinary:   Urinary urgency and frequency. Musculoskeletal:  No neck pain, back pain Integumentary: No rash, pruritus, skin lesions Neurological: as above Psychiatric: Anxiety and mild depression. Endocrine: She is a type I diabetic. Hematologic/Lymphatic:  No anemia, purpura, petechiae. Allergic/Immunologic: No itchy/runny eyes, nasal congestion, recent allergic reactions, rashes  ALLERGIES: Allergies  Allergen Reactions   Morphine And Related Rash    HOME MEDICATIONS:  Current Outpatient Medications:    acetaminophen (TYLENOL) 325 MG tablet, Take 325 mg by mouth every 6 (six) hours as needed for mild pain., Disp: , Rfl:    amphetamine-dextroamphetamine (ADDERALL) 10 MG tablet, Take 1 tablet (10 mg total) by mouth 2 (two) times daily with a meal., Disp: 60 tablet, Rfl: 0   busPIRone (BUSPAR) 30 MG tablet, Take 1 tablet (30 mg total) by mouth 2 (two)  times daily., Disp: 180 tablet, Rfl: 3   Cholecalciferol (DIALYVITE VITAMIN D 5000) 125 MCG (5000 UT) capsule, Take 5,000 Units by mouth daily., Disp: , Rfl:    escitalopram (LEXAPRO) 20 MG tablet, TAKE 1 TABLET BY MOUTH DAILY, Disp: 30 tablet, Rfl: 1   ibuprofen (ADVIL) 200 MG tablet, Take 200 mg by mouth every 6 (six) hours as needed for moderate pain., Disp: , Rfl:    insulin aspart (NOVOLOG) 100 UNIT/ML injection, Inject 10-23 Units into the skin 3 (three) times daily before meals., Disp: , Rfl:    insulin degludec (TRESIBA FLEXTOUCH) 200 UNIT/ML FlexTouch Pen, Inject 40 Units into the skin 2 (two) times daily., Disp: , Rfl:    meclizine (ANTIVERT) 25 MG tablet, Take 1 tablet (25 mg total)  by mouth 3 (three) times daily as needed for dizziness., Disp: 90 tablet, Rfl: 0   natalizumab (TYSABRI) 300 MG/15ML injection, Inject 300 mg as directed every 30 (thirty) days., Disp: , Rfl:    ondansetron (ZOFRAN ODT) 4 MG disintegrating tablet, Take 1 tablet (4 mg total) by mouth every 8 (eight) hours as needed., Disp: 20 tablet, Rfl: 6   Semaglutide,0.25 or 0.5MG /DOS, (OZEMPIC, 0.25 OR 0.5 MG/DOSE,) 2 MG/1.5ML SOPN, Inject 0.5 mg into the skin every Tuesday., Disp: , Rfl:    TOUJEO MAX SOLOSTAR 300 UNIT/ML Solostar Pen, Inject 40 Units into the skin 2 (two) times daily., Disp: 3 mL, Rfl: 0   zonisamide (ZONEGRAN) 100 MG capsule, Take 1 capsule (100 mg total) by mouth at bedtime., Disp: 90 capsule, Rfl: 1  PAST MEDICAL HISTORY: Past Medical History:  Diagnosis Date   Heart murmur    as a child   Hypoglycemia associated with diabetes (HCC)    Multiple sclerosis (HCC)    Obesity, morbid (HCC)    Pancreatitis    Tachycardia    Type 1 diabetes mellitus not at goal [facility])    since age of 25 years old    PAST SURGICAL HISTORY: Past Surgical History:  Procedure Laterality Date   CESAREAN SECTION N/A 03/17/2016   Procedure: CESAREAN SECTION;  Surgeon: Marlow Baars, MD;  Location: [facility];   Service: Obstetrics;  Laterality: N/A;  REQUEST RNFA   CESAREAN SECTION N/A 08/06/2018   Procedure: CESAREAN SECTION;  Surgeon: Marlow Baars, MD;  Location: [facility] BIRTHING SU[facility];  Service: Obstetrics;  Laterality: N/A;  RNFA AVAILABLE   CHOLECYSTECTOMY     IR IMAGING GUIDED PORT INSERTION  11/02/2020    FAMILY HISTORY: Family History  Problem Relation Age of Onset   Thyroid disease Mother    Obesity Mother    Diabetes Father    Obesity Father    Obesity Sister    Cancer Maternal Grandmother    Obesity Paternal Grandfather     SOCIAL HISTORY:  Social History   Socioeconomic History   Marital status: Married    Spouse name: Not on file   Number of children: Not on file   Years of education: Not on file   Highest education level: Not on file  Occupational History   Not on file  Tobacco Use   Smoking status: Never   Smokeless tobacco: Never  Vaping Use   Vaping Use: Never used  Substance and Sexual Activity   Alcohol use: No    Alcohol/week: 0.0 standard drinks   Drug use: No   Sexual activity: Yes    Birth control/protection: None  Other Topics Concern   Not on file  Social History Narrative   Right handed   Caffeine use: none   Newly married   [employer] - online   Works part time at a daycare   Works at a [employer] time   Social Determinants of Corporate investment banker Strain: Not on [employer] Insecurity: Not on file  Transportation Needs: Not on file  Physical Activity: Not on file  Stress: Not on file  Social Connections: Not on file  Intimate Partner Violence: Not on file     PHYSICAL EXAM  Vitals:   07/04/21 1439  BP: (!) 144/95  Pulse: (!) 106  Weight: 232 lb 9.6 oz (105.5 kg)  Height: 5\' 4"  (1.626 m)    Body mass index is 39.93 kg/m.  No results found.  General: The patient is well-developed and well-nourished and in no acute distress  HEENT:  Head is [place]/AT.  Sclera are anicteric.     Skin: Extremities are  without rash or edema.   Neurologic Exam  Mental status: The patient is alert and oriented x 3 at the time of the examination.  Speech is normal.  Cranial nerves: Extraocular movements are full.   Facial symmetry is present. She reports reduced left facial sensation to soft touch and temperature.    .Facial strength is normal.  Trapezius and sternocleidomastoid strength is normal. No dysarthria is noted.   No obvious hearing deficits are noted.  Motor:  Muscle bulk is normal.   Tone is normal. Strength is  5 / 5 proximally but reduced RAM left hand and mild reduced grip intrinsic hand muscles, left > right.    There is some giveway.   Sensory: She reports reduced touch, temperature and vibration sensation in the left arm    She reports more symmetric le sensation   Coordination: Cerebellar testing reveals good finger-nose-finger right and slightly reduced heel-to-shin bilaterally.  Gait and station: Station is normal.   Gait is mildly wide and tandem is wide. Minimal left foot drop.  Romberg is borderline positive.   Reflexes: Deep tendon reflexes are symmetric and normal bilaterally.        DIAGNOSTIC DATA (LABS, IMAGING, TESTING) - I reviewed patient records, labs, notes, testing and imaging myself where available.  Lab Results  Component Value Date   WBC 10.4 06/19/2021   HGB 13.7 06/19/2021   HCT 42.1 06/19/2021   MCV 84 06/19/2021   PLT 346 06/19/2021      Component Value Date/Time   NA 139 02/23/2020 0436   K 4.2 02/23/2020 0436   CL 111 02/23/2020 0436   CO2 19 (L) 02/23/2020 0436   GLUCOSE 232 (H) 02/23/2020 0436   BUN 15 02/23/2020 0436   CREATININE 0.58 02/23/2020 0436   CREATININE 0.62 03/22/2015 1210   CALCIUM 8.3 (L) 02/23/2020 0436   PROT 6.2 09/24/2020 1345   ALBUMIN 4.0 09/24/2020 1345   AST 14 09/24/2020 1345   ALT 16 09/24/2020 1345   ALKPHOS 123 (H) 09/24/2020 1345   BILITOT 0.3 09/24/2020 1345   GFRNONAA >60 02/23/2020 0436   GFRNONAA >89  03/22/2015 1210   GFRAA >60 02/23/2020 0436   GFRAA >89 03/22/2015 1210   Lab Results  Component Value Date   CHOL 160 03/22/2015   HDL 35.30 (L) 03/22/2015   LDLCALC 95 03/22/2015   TRIG 151.0 (H) 03/22/2015   CHOLHDL 5 03/22/2015   Lab Results  Component Value Date   HGBA1C 10.0 (H) 02/21/2020   Lab Results  Component Value Date   VITAMINB12 229 11/07/2019   Lab Results  Component Value Date   TSH 2.716 11/07/2019       ASSESSMENT AND PLAN  Multiple sclerosis (HCC) - Plan: IgG, IgA, IgM, HIV Antibody (routine testing w rflx), QuantiFERON-TB Gold Plus, Varicella zoster antibody, IgG  High risk medication use - Plan: IgG, IgA, IgM, HIV Antibody (routine testing w rflx), QuantiFERON-TB Gold Plus, Varicella zoster antibody, IgG  Gait disturbance  Other fatigue  1.   Continue Tysabri every 6 weeks for about 4 more infusions.  Although she did convert to middle positive on the JCV antibody test (a titer of 1.0) she has not been on the medicine for 7 months so her risk is low for several more months.  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  We went over the risks and benefits of this decision and alternatives.   I will go ahead and check TB, HIV, VZV and IgG/IgM.  Of note, she has recent hepatitis labs showing that she is immune after being vaccinated.  The varicella IgG has been negative but she has had the vaccination.   2.   Continue Adderall for weight loss, ADD and sleepiness/fatigue 3.  .rtc 5 months, sooner if new or worsening neurologic function   Averey Koning A. Epimenio Foot, MD, [facility] 07/05/2021, 12:39 PM Certified in Neurology, Clinical Neurophysiology, Sleep Medicine and Neuroimaging  [facility] [address] [place], [place] [address] [phone]

## 2021-07-09 LAB — VARICELLA ZOSTER ANTIBODY, IGG: Varicella zoster IgG: <135 index — ABNORMAL LOW (ref >165)

## 2021-07-09 LAB — HIV ANTIBODY (ROUTINE TESTING W REFLEX): HIV Screen 4th Generation wRfx: NONREACTIVE

## 2021-07-09 LAB — QUANTIFERON-TB GOLD PLUS
QuantiFERON Mitogen Value: 8.41 IU/mL
QuantiFERON Nil Value: 0.18 IU/mL
QuantiFERON TB1 Ag Value: 0.21 IU/mL
QuantiFERON TB2 Ag Value: 0.46 IU/mL
QuantiFERON-TB Gold Plus: NEGATIVE

## 2021-07-09 LAB — IGG, IGA, IGM
IgA/Immunoglobulin A, Serum: 194 mg/dL (ref 87–352)
IgG (Immunoglobin G), Serum: 861 mg/dL (ref 586–1602)
IgM (Immunoglobulin M), Srm: 28 mg/dL (ref 26–217)

## 2021-07-10 ENCOUNTER — Telehealth: Payer: Self-pay

## 2021-07-10 NOTE — Telephone Encounter (Signed)
I called patient to discuss. No answer, left a message asking her to call me back.   I don't have an [employer] form on her but she can complete it online.

## 2021-07-10 NOTE — Telephone Encounter (Signed)
Patient returned my call. I advised her that her labs were fine. She will continue Tysabri through April. In May, she will start Ocrevus.  I will send her the Ocrevus start form via mychart. She can complete and send back to [facility].  Pt verbalized understanding.

## 2021-07-10 NOTE — Telephone Encounter (Signed)
-----   Message from Asa Lente, MD sent at 07/09/2021  6:43 PM EST ----- She is currently on Tysabri but converted to JCV positive.  She would like to go back on Ocrevus.  However, she has 4 more months of nursing school and would like to wait until May to switch.  Her lab work was fine.

## 2021-07-14 ENCOUNTER — Other Ambulatory Visit: Payer: Self-pay | Admitting: Neurology

## 2021-07-15 MED ORDER — AMPHETAMINE-DEXTROAMPHETAMINE 10 MG PO TABS: 10.0000 mg | ORAL_TABLET | Freq: Two times a day (BID) | ORAL | 0 refills | Status: DC

## 2021-07-15 NOTE — Telephone Encounter (Signed)
Received refill request for Adderall 10mg .  Last OV was on 07/04/21.  Next OV is scheduled for 12/12/21 .  Last RX was written on 06/12/21 for 60 tabs.   [facility] has been reviewed.

## 2021-07-17 NOTE — Telephone Encounter (Signed)
Ocrevus order signed by Dr. Epimenio Foot. Order given to [facility] for processing.

## 2021-07-22 ENCOUNTER — Ambulatory Visit (INDEPENDENT_AMBULATORY_CARE_PROVIDER_SITE_OTHER): Payer: 59 | Admitting: Psychiatry

## 2021-07-22 ENCOUNTER — Encounter (HOSPITAL_COMMUNITY): Payer: Self-pay | Admitting: Psychiatry

## 2021-07-22 DIAGNOSIS — F411 Generalized anxiety disorder: Secondary | ICD-10-CM | POA: Diagnosis not present

## 2021-07-22 DIAGNOSIS — F0630 Mood disorder due to known physiological condition, unspecified: Secondary | ICD-10-CM

## 2021-07-22 DIAGNOSIS — R5383 Other fatigue: Secondary | ICD-10-CM

## 2021-07-22 MED ORDER — ESCITALOPRAM OXALATE 20 MG PO TABS: 30.0000 mg | ORAL_TABLET | Freq: Every day | ORAL | 1 refills | Status: DC
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
## 2021-07-25 NOTE — Telephone Encounter (Signed)
I spoke with our infusion suite. They will hold the Ocrevus order until closer to May in order to avoid confusing her insurance.

## 2021-08-01 ENCOUNTER — Ambulatory Visit: Payer: 59 | Admitting: Neurology

## 2021-08-20 ENCOUNTER — Other Ambulatory Visit: Payer: Self-pay | Admitting: Neurology

## 2021-08-21 MED ORDER — AMPHETAMINE-DEXTROAMPHETAMINE 10 MG PO TABS: 10.0000 mg | ORAL_TABLET | Freq: Two times a day (BID) | ORAL | 0 refills | Status: DC

## 2021-08-21 NOTE — Telephone Encounter (Signed)
Received refill request for Adderall.  Last OV was on 07/04/21.  Next OV is scheduled for 12/12/21 .  Last RX was written on 07/15/21 for 60 tabs.   [place] Drug Database has been reviewed.

## 2021-08-24 ENCOUNTER — Telehealth: Payer: Self-pay | Admitting: Nurse Practitioner

## 2021-08-24 DIAGNOSIS — N3000 Acute cystitis without hematuria: Secondary | ICD-10-CM

## 2021-08-24 MED ORDER — CEPHALEXIN 500 MG PO CAPS: 500.0000 mg | ORAL_CAPSULE | Freq: Two times a day (BID) | ORAL | 0 refills | Status: AC

## 2021-08-24 NOTE — Progress Notes (Signed)
E-Visit for Urinary Problems ° °We are sorry that you are not feeling well.  Here is how we plan to help! ° °Based on what you shared with me it looks like you most likely have a simple urinary tract infection. ° °A UTI (Urinary Tract Infection) is a bacterial infection of the bladder. ° °Most cases of urinary tract infections are simple to treat but a key part of your care is to encourage you to drink plenty of fluids and watch your symptoms carefully. ° °I have prescribed Keflex 500 mg twice a day for 7 days.  Your symptoms should gradually improve. Call us if the burning in your urine worsens, you develop worsening fever, back pain or pelvic pain or if your symptoms do not resolve after completing the antibiotic. ° °Urinary tract infections can be prevented by drinking plenty of water to keep your body hydrated.  Also be sure when you wipe, wipe from front to back and don't hold it in!  If possible, empty your bladder every 4 hours. ° °HOME CARE °Drink plenty of fluids °Compete the full course of the antibiotics even if the symptoms resolve °Remember, when you need to go…go. Holding in your urine can increase the likelihood of getting a UTI! °GET HELP RIGHT AWAY IF: °You cannot urinate °You get a high fever °Worsening back pain occurs °You see blood in your urine °You feel sick to your stomach or throw up °You feel like you are going to pass out ° °MAKE SURE YOU  °Understand these instructions. °Will watch your condition. °Will get help right away if you are not doing well or get worse. ° ° °Thank you for choosing an e-visit. ° °Your e-visit answers were reviewed by a board certified advanced clinical practitioner to complete your personal care plan. Depending upon the condition, your plan could have included both over the counter or prescription medications. ° °Please review your pharmacy choice. Make sure the pharmacy is open so you can pick up prescription now. If there is a problem, you may contact your  provider through MyChart messaging and have the prescription routed to another pharmacy.  Your safety is important to us. If you have drug allergies check your prescription carefully.  ° °For the next 24 hours you can use MyChart to ask questions about today's visit, request a non-urgent call back, or ask for a work or school excuse. °You will get an email in the next two days asking about your experience. I hope that your e-visit has been valuable and will speed your recovery.  ° °I spent approximately 7 minutes reviewing the patient's history, current symptoms and coordinating their plan of care today.   ° °Meds ordered this encounter  °Medications  ° cephALEXin (KEFLEX) 500 MG capsule  °  Sig: Take 1 capsule (500 mg total) by mouth 2 (two) times daily for 7 days.  °  Dispense:  14 capsule  °  Refill:  0  °  °

## 2021-09-03 ENCOUNTER — Ambulatory Visit (HOSPITAL_COMMUNITY): Payer: 59 | Admitting: Psychiatry

## 2021-09-15 IMAGING — US IR IMAGING GUIDED PORT INSERTION
1 series · 1 of 1 positions shown · non-contrast
Comparison: none

INDICATION: 24-year-old female referred for port catheter

[Series 1: ir fluoro/shunt/fist · 1 of 1 slices shown]
[im 1/1]
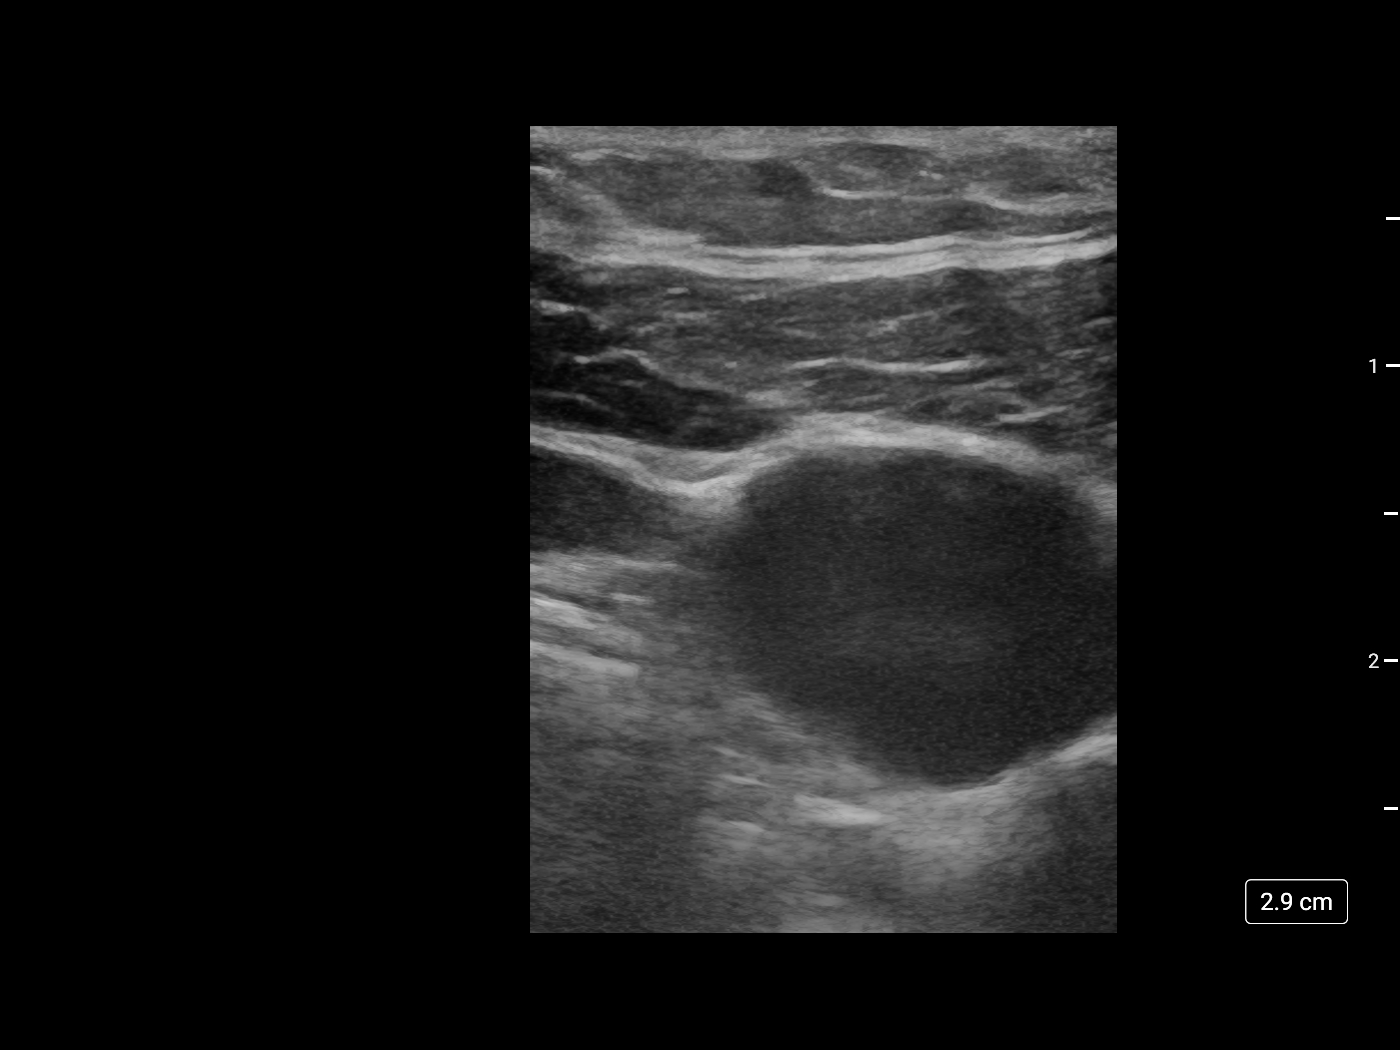

[1 of 1 positions shown; findings below may reference images not displayed]

EXAM:
IMAGE GUIDED PORT CATHETER PLACEMENT

MEDICATIONS:
None

ANESTHESIA/SEDATION:
Moderate (conscious) sedation was employed during this procedure. A
total of Versed 1.5 mg and Fentanyl 75 mcg was administered
intravenously.

Moderate Sedation Time: 22 minutes. The patient's level of
consciousness and vital signs were monitored continuously by
radiology nursing throughout the procedure under my direct
supervision.

FLUOROSCOPY TIME:  Fluoroscopy Time: 0 minutes 12 seconds (2.9 mGy).

COMPLICATIONS:
None

PROCEDURE:
The procedure, risks, benefits, and alternatives were explained to
the patient. Questions regarding the procedure were encouraged and
answered. The patient understands and consents to the procedure.

Ultrasound survey was performed with images stored and sent to PACs.

The right neck and chest was prepped with chlorhexidine, and draped
in the usual sterile fashion using maximum barrier technique (cap
and mask, sterile gown, sterile gloves, large sterile sheet, hand
hygiene and cutaneous antiseptic). Local anesthesia was attained by
infiltration with 1% lidocaine without epinephrine.

Ultrasound demonstrated patency of the right internal jugular vein,
and this was documented with an image. Under real-time ultrasound
guidance, this vein was accessed with a 21 gauge micropuncture
needle and image documentation was performed. A small dermatotomy
was made at the access site with an 11 scalpel. A 0.018" wire was
advanced into the SVC and used to estimate the length of the
internal catheter. The access needle exchanged for a 4F
micropuncture vascular sheath. The 0.018" wire was then removed and
a 0.035" wire advanced into the IVC.



The venous access site was then serially dilated and a peel away
vascular sheath placed over the wire. The wire was removed and the
port catheter advanced into position under fluoroscopic guidance.
The catheter tip is positioned in the cavoatrial junction. This was
documented with a spot image. The portacatheter was then tested and
found to flush and aspirate well. The port was flushed with saline
followed by 100 units/mL heparinized saline.

The pocket was then closed in two layers using first subdermal
inverted interrupted absorbable sutures followed by a running
subcuticular suture. The epidermis was then sealed with Dermabond.
The dermatotomy at the venous access site was also seal with
Dermabond.

Patient tolerated the procedure well and remained hemodynamically
stable throughout.

No complications encountered and no significant blood loss
encountered
IMPRESSION: Status post right IJ port catheter placement.

## 2021-10-02 ENCOUNTER — Other Ambulatory Visit: Payer: Self-pay | Admitting: *Deleted

## 2021-10-02 ENCOUNTER — Other Ambulatory Visit: Payer: Self-pay | Admitting: Neurology

## 2021-10-02 ENCOUNTER — Other Ambulatory Visit (INDEPENDENT_AMBULATORY_CARE_PROVIDER_SITE_OTHER): Payer: Self-pay

## 2021-10-02 DIAGNOSIS — Z0289 Encounter for other administrative examinations: Secondary | ICD-10-CM

## 2021-10-02 DIAGNOSIS — Z79899 Other long term (current) drug therapy: Secondary | ICD-10-CM

## 2021-10-02 DIAGNOSIS — G35 Multiple sclerosis: Secondary | ICD-10-CM

## 2021-10-02 MED ORDER — AMPHETAMINE-DEXTROAMPHETAMINE 10 MG PO TABS
10.0000 mg | ORAL_TABLET | Freq: Two times a day (BID) | ORAL | 0 refills | Status: DC
Start: 2021-10-02 — End: 2021-11-10

## 2021-10-02 NOTE — Telephone Encounter (Signed)
Received refill request for Adderall 10mg .  Last OV was on 07/04/21.  Next OV is scheduled for 12/12/21 .  Last RX was written on 08/22/21 for 60 tabs.   [place] [facility] Database has been reviewed.

## 2021-10-14 LAB — CBC WITH DIFFERENTIAL/PLATELET
Basophils Absolute: 0 10*3/uL (ref 0.0–0.2)
Basos: 1 %
EOS (ABSOLUTE): 0.5 10*3/uL — ABNORMAL HIGH (ref 0.0–0.4)
Eos: 6 %
Hematocrit: 41.9 % (ref 34.0–46.6)
Hemoglobin: 14 g/dL (ref 11.1–15.9)
Immature Grans (Abs): 0 10*3/uL (ref 0.0–0.1)
Immature Granulocytes: 0 %
Lymphocytes Absolute: 3.1 10*3/uL (ref 0.7–3.1)
Lymphs: 35 %
MCH: 28.7 pg (ref 26.6–33.0)
MCHC: 33.4 g/dL (ref 31.5–35.7)
MCV: 86 fL (ref 79–97)
Monocytes Absolute: 0.5 10*3/uL (ref 0.1–0.9)
Monocytes: 5 %
Neutrophils Absolute: 4.7 10*3/uL (ref 1.4–7.0)
Neutrophils: 53 %
Platelets: 299 10*3/uL (ref 150–450)
RBC: 4.87 x10E6/uL (ref 3.77–5.28)
RDW: 13.5 % (ref 11.7–15.4)
WBC: 8.8 10*3/uL (ref 3.4–10.8)

## 2021-10-14 LAB — STRATIFY JCV(TM) AB W/INDEX
JCV Antibody: POSITIVE — AB
JCV Index Value: 0.54

## 2021-10-28 DIAGNOSIS — E1065 Type 1 diabetes mellitus with hyperglycemia: Secondary | ICD-10-CM | POA: Diagnosis not present

## 2021-11-01 DIAGNOSIS — Z01419 Encounter for gynecological examination (general) (routine) without abnormal findings: Secondary | ICD-10-CM | POA: Diagnosis not present

## 2021-11-10 ENCOUNTER — Other Ambulatory Visit: Payer: Self-pay | Admitting: Neurology

## 2021-11-11 MED ORDER — AMPHETAMINE-DEXTROAMPHETAMINE 10 MG PO TABS: 10.0000 mg | ORAL_TABLET | Freq: Two times a day (BID) | ORAL | 0 refills | Status: DC

## 2021-11-11 NOTE — Telephone Encounter (Signed)
Last OV was on 07/04/21.  ?Next OV is scheduled for 12/12/21 .  ?Last RX was written on 10/12/21 for 60 tabs.  ? ?[place] [facility] Database has been reviewed.  ?

## 2021-11-13 ENCOUNTER — Encounter: Payer: Self-pay | Admitting: Neurology

## 2021-11-13 DIAGNOSIS — G35 Multiple sclerosis: Secondary | ICD-10-CM | POA: Diagnosis not present

## 2021-12-10 DIAGNOSIS — E1065 Type 1 diabetes mellitus with hyperglycemia: Secondary | ICD-10-CM | POA: Diagnosis not present

## 2021-12-12 ENCOUNTER — Ambulatory Visit (INDEPENDENT_AMBULATORY_CARE_PROVIDER_SITE_OTHER): Payer: BC Managed Care – PPO | Admitting: Neurology

## 2021-12-12 ENCOUNTER — Encounter: Payer: Self-pay | Admitting: Neurology

## 2021-12-12 VITALS — BP 126/80 | HR 86 | Ht 64.0 in | Wt 229.5 lb

## 2021-12-12 DIAGNOSIS — R200 Anesthesia of skin: Secondary | ICD-10-CM

## 2021-12-12 DIAGNOSIS — Z79899 Other long term (current) drug therapy: Secondary | ICD-10-CM

## 2021-12-12 DIAGNOSIS — R269 Unspecified abnormalities of gait and mobility: Secondary | ICD-10-CM

## 2021-12-12 DIAGNOSIS — G35 Multiple sclerosis: Secondary | ICD-10-CM | POA: Diagnosis not present

## 2021-12-12 DIAGNOSIS — R5383 Other fatigue: Secondary | ICD-10-CM

## 2021-12-12 MED ORDER — AMPHETAMINE-DEXTROAMPHETAMINE 10 MG PO TABS: 10.0000 mg | ORAL_TABLET | Freq: Two times a day (BID) | ORAL | 0 refills | Status: DC

## 2021-12-12 NOTE — Progress Notes (Signed)
? ?[facility] ? ?PATIENT: Danielle Baker ?DOB: 1996-03-20 ? ?REFERRING DOCTOR OR PCP:   Abner Greenspan, MD (PCP) ?SOURCE: Patient, notes from both hospital admissions, imaging and laboratory reports, MRI images personally reviewed. ? ?_________________________________ ? ? ?HISTORICAL ? ?CHIEF COMPLAINT:  ?Chief Complaint  ?Patient presents with  ? Follow-up  ?  Rm 1, alone. Here for 6 month MS f/u, on Tysabri and tolerating well. MS stable.   ? ? ?HISTORY OF PRESENT ILLNESS: ? ?Update 12/12/2021:   ?She is on Tysabri and has tolerated it well with no exacerbation.  Unfortunately, she is low positive and is on q 6 weeks.   She had one titer of 1 but las titer was 0.54.      We discussed her risk of PML.   Initially, she did Ocrevus but felt poorly on it.   She was very tired on Ocrevus so switched to Tysabri in March 2022.    ? ?Gait is doing well with mild reduced balance .   She goes down stairs holding the bannister but can do without.   She feels gai is mildly better than it was 2 years ago.   She is able to walk a mile.   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    Meclizine has helped the vertigo and ondansetron helps the nausea.    ? ?Fatigue improved on Tysabri and with Adderall.   She has stable depression and anxiety. She is on Lexapro.  She sleeps well at night now that she no longer takes naps.   ? ?She notes reduced STM, verbal fluency but these are better with Adderall 10 mg po twice a day/   ? ?Vit D was low and she is taking 5000 U daily (was on 50000 weekly)  ?   ?She does not have immunity by IgG but did have the vaccine x 2 shos and had chciken pox as a child.    She does have HepB immunity (was vaccinated).  ? ?MS History: ?She had the rapid onset of  left arm and flank numbness on 11/07/2019, and to a lesser extent in the right arm.  In retrospect she  had some dysesthetic sensation in her trunk 2 weeks earlier.    She went to [facility] and was admitted for IV Solumedrol.   Two weeks later, she had left visual changes  and more numbness bilaterally but more on her right side and also had left facial numbness.   She has a Lhermitte sign into the right leg with the second episode last week.    She received 3 days IV Solu-Medrol and noted improvement but feels the Lhermitte sign and numbness are worse since then.   She started Ocrevus June/July 2021.    ? ?She has no FH of MS.   ? ?Data reviewed: ?MRI brain 06/20/2020 showed T2/FLAIR hyperintense foci in the pons, left middle cerebellar peduncle and in the hemispheres in a pattern and configuration consistent with chronic demyelinating plaque associated with multiple sclerosis.  None of the foci appear to be acute.  Compared to the MRI from 11/07/2019, there do not appear to be any new lesions. ? ?MRI cervical spine 06/20/2020 showed T2 hyperintense foci within the spinal cord posteriorly adjacent to C3-C4, to the left adjacent to T1 and adjacent to  T2 consistent with chronic demyelinating plaque associated with multiple sclerosis.  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  There are also very minimal stable disc bulges at C3-C4 and C5-C6.  There is no nerve root compression. ? ?MRI thoracic spine 06/20/2020 showed Foci within the spinal cord adjacent to T1 and T3-T4 as detailed above.  A third focus adjacent to T2 was noted on the cervical spine images but not clearly apparent on the thoracic spine images.  All 3 of these foci were present on MRIs from 11/07/2019 and all consistent with chronic demyelinating plaque associated with multiple sclerosis.  There are no new lesions. ? ?MRI of the cervical and thoracic spine 11/07/2019 showed enhancing foci at C3C4 posteriorly to the left and T1 and non-enhancing foci centrally at  C1-C2, T2 centrally and T3T4 towards the right.   Brain MRI showed scattered foci in the MCP's, left cerebellar hemisphere, pons, left thalamus and periventricular and juxtacortical white matter.   Several foci in the hemispheres and in the left middle cerebellar peduncle enhanced after contrast.    MRI of the brain and orbits 11/22/2019 showed an additional enhancing lesion in the left posterior lateral pons.    Orbits were normal.   ? ?Labs showed negative HIV and NMO Ab.   Vit D was 11 (now being supplemented). ? ? ?REVIEW OF SYSTEMS: ?Constitutional: No fevers, chills, sweats, or change in appetite.  She has fatigue. ?Eyes: No visual changes, double vision, eye pain ?Ear, nose and throat: No hearing loss, ear pain, nasal congestion, sore throat ?Cardiovascular: No chest pain, palpitations ?Respiratory:  No shortness of breath at rest or with exertion.   No wheezes ?GastrointestinaI: No nausea, vomiting, diarrhea, abdominal pain, fecal incontinence ?Genitourinary:   Urinary urgency and frequency. ?Musculoskeletal:  No neck pain, back pain ?Integumentary: No rash, pruritus, skin lesions ?Neurological: as above ?Psychiatric: Anxiety and mild depression. ?Endocrine: She is a type I diabetic. ?Hematologic/Lymphatic:  No anemia, purpura, petechiae. ?Allergic/Immunologic: No itchy/runny eyes, nasal congestion, recent allergic reactions, rashes ? ?ALLERGIES: ?Allergies  ?Allergen Reactions  ? Morphine And Related Rash  ? ? ?HOME MEDICATIONS: ? ?Current Outpatient Medications:  ?  acetaminophen (TYLENOL) 325 MG tablet, Take 325 mg by mouth every 6 (six) hours as needed for mild pain., Disp: , Rfl:  ?  amphetamine-dextroamphetamine (ADDERALL) 10 MG tablet, Take 1 tablet (10 mg total) by mouth 2 (two) times daily with a meal., Disp: 60 tablet, Rfl: 0 ?  Cholecalciferol (DIALYVITE VITAMIN D 5000) 125 MCG (5000 UT) capsule, Take 5,000 Units by mouth daily., Disp: , Rfl:  ?  escitalopram (LEXAPRO) 20 MG tablet, Take 1.5 tablets  (30 mg total) by mouth daily., Disp: 45 tablet, Rfl: 1 ?  ibuprofen (ADVIL) 200 MG tablet, Take 200 mg by mouth every 6 (six) hours as needed for moderate pain., Disp: , Rfl:  ?  insulin aspart (NOVOLOG) 100 UNIT/ML injection, Inject 10-23 Units into the skin 3 (three) times daily before meals., Disp: , Rfl:  ?  insulin degludec (TRESIBA FLEXTOUCH) 200 UNIT/ML FlexTouch Pen, Inject 40 Units into the skin 2 (two) times daily., Disp: , Rfl:  ?  levonorgestrel (MIRENA, 52 MG,) 20 MCG/DAY IUD, Mirena 20 mcg/24 hours (7 yrs) 52 mg intrauterine device  provided by [facility], Disp: , Rfl:  ?  meclizine (ANTIVERT) 25 MG tablet, Take 1 tablet (25 mg total) by mouth  3 (three) times daily as needed for dizziness., Disp: 90 tablet, Rfl: 0 ?  natalizumab (TYSABRI) 300 MG/15ML injection, Inject 300 mg as directed every 30 (thirty) days., Disp: , Rfl:  ?  ondansetron (ZOFRAN ODT) 4 MG disintegrating tablet, Take 1 tablet (4 mg total) by mouth every 8 (eight) hours as needed., Disp: 20 tablet, Rfl: 6 ?  Semaglutide,0.25 or 0.5MG /DOS, (OZEMPIC, 0.25 OR 0.5 MG/DOSE,) 2 MG/1.5ML SOPN, Inject 0.5 mg into the skin every Tuesday., Disp: , Rfl:  ?  zonisamide (ZONEGRAN) 100 MG capsule, Take 1 capsule (100 mg total) by mouth at bedtime., Disp: 90 capsule, Rfl: 1 ? ?PAST MEDICAL HISTORY: ?Past Medical History:  ?Diagnosis Date  ? Heart murmur   ? as a child  ? Hypoglycemia associated with diabetes (HCC)   ? Multiple sclerosis (HCC)   ? Obesity, morbid (HCC)   ? Pancreatitis   ? Tachycardia   ? Type 1 diabetes mellitus not at goal [facility])   ? since age of 26 years old  ? ? ?PAST SURGICAL HISTORY: ?Past Surgical History:  ?Procedure Laterality Date  ? CESAREAN SECTION N/A 03/17/2016  ? Procedure: CESAREAN SECTION;  Surgeon: Marlow Baars, MD;  Location: [facility] BIRTHING SUITES;  Service: Obstetrics;  Laterality: N/A;  REQUEST RNFA  ? CESAREAN SECTION N/A 08/06/2018  ? Procedure: CESAREAN SECTION;  Surgeon: Marlow Baars, MD;  Location: [facility] BIRTHING  SUITES;  Service: Obstetrics;  Laterality: N/A;  RNFA AVAILABLE  ? CHOLECYSTECTOMY    ? IR IMAGING GUIDED PORT INSERTION  11/02/2020  ? ? ?FAMILY HISTORY: ?Family History  ?Problem Relation Age of Onset

## 2021-12-23 ENCOUNTER — Telehealth: Payer: Self-pay | Admitting: Neurology

## 2021-12-23 NOTE — Telephone Encounter (Signed)
[employer] Berkley Harvey: [id number] (exp. 12/23/21 to 02/20/22) order sent to GI. They will reach out to the patient to schedule.  ?

## 2021-12-25 ENCOUNTER — Telehealth: Payer: Self-pay | Admitting: *Deleted

## 2021-12-25 ENCOUNTER — Other Ambulatory Visit (INDEPENDENT_AMBULATORY_CARE_PROVIDER_SITE_OTHER): Payer: Self-pay

## 2021-12-25 DIAGNOSIS — Z79899 Other long term (current) drug therapy: Secondary | ICD-10-CM | POA: Diagnosis not present

## 2021-12-25 DIAGNOSIS — G35 Multiple sclerosis: Secondary | ICD-10-CM | POA: Diagnosis not present

## 2021-12-25 DIAGNOSIS — Z0289 Encounter for other administrative examinations: Secondary | ICD-10-CM

## 2021-12-25 NOTE — Telephone Encounter (Signed)
Placed JCV lab in [facility] lock box for routine lab pick up. Results pending. 

## 2021-12-26 ENCOUNTER — Encounter: Payer: Self-pay | Admitting: Neurology

## 2021-12-26 ENCOUNTER — Telehealth: Payer: BC Managed Care – PPO | Admitting: Physician Assistant

## 2021-12-26 DIAGNOSIS — T753XXA Motion sickness, initial encounter: Secondary | ICD-10-CM | POA: Diagnosis not present

## 2021-12-26 LAB — CBC WITH DIFFERENTIAL/PLATELET
Basophils Absolute: 0.1 10*3/uL (ref 0.0–0.2)
Basos: 1 %
EOS (ABSOLUTE): 0.8 10*3/uL — ABNORMAL HIGH (ref 0.0–0.4)
Eos: 6 %
Hematocrit: 45.3 % (ref 34.0–46.6)
Hemoglobin: 15 g/dL (ref 11.1–15.9)
Immature Grans (Abs): 0 10*3/uL (ref 0.0–0.1)
Immature Granulocytes: 0 %
Lymphocytes Absolute: 3.9 10*3/uL — ABNORMAL HIGH (ref 0.7–3.1)
Lymphs: 30 %
MCH: 29.9 pg (ref 26.6–33.0)
MCHC: 33.1 g/dL (ref 31.5–35.7)
MCV: 90 fL (ref 79–97)
Monocytes Absolute: 0.5 10*3/uL (ref 0.1–0.9)
Monocytes: 4 %
Neutrophils Absolute: 7.6 10*3/uL — ABNORMAL HIGH (ref 1.4–7.0)
Neutrophils: 59 %
Platelets: 284 10*3/uL (ref 150–450)
RBC: 5.02 x10E6/uL (ref 3.77–5.28)
RDW: 12.9 % (ref 11.7–15.4)
WBC: 12.8 10*3/uL — ABNORMAL HIGH (ref 3.4–10.8)

## 2021-12-26 MED ORDER — SCOPOLAMINE 1 MG/3DAYS TD PT72: 1.0000 | MEDICATED_PATCH | TRANSDERMAL | 0 refills | Status: DC

## 2021-12-26 NOTE — Progress Notes (Signed)
I have spent 5 minutes in review of e-visit questionnaire, review and updating patient chart, medical decision making and response to patient.   Ai Sonnenfeld Cody Irene Collings, PA-C    

## 2021-12-26 NOTE — Progress Notes (Signed)
E Visit for Motion Sickness  We are sorry that you are not feeling well. Here is how we plan to help!  Based on what you have shared with me it looks like you have symptoms of motion sickness.  I have prescribed a medication that will help prevent or alleviate your symptoms:  Scopolamine Transdermal 1 mg patch behind ear at least 4 hours prior to travel (preferably 12 hours)   Prevention:  You might feel better if you keep your eyes focused on outside while you are in motion. For example, if you are in a car, sit in the front and look in the direction you are moving; if you are on a boat, stay on the deck and look to the horizon. This helps make what you see match the movement you are feeling, and so you are less likely to feel sick.  You should also avoid reading, watching a movie, texting or reading messages, or looking at things close to you inside the vehicle you are riding in.  . Use the seat head rest. Lean your head against the back of the seat or head rest when traveling in vehicles with seats to minimize head movements.  . On a ship: When making your reservations, choose a cabin in the middle of the ship and near the waterline. When on board, go up on deck and focus on the horizon.  . In an airplane: Request a window seat and look out the window. A seat over the front edge of the wing is the most preferable spot (the degree of motion is the lowest here). Direct the air vent to blow cool air on your face.  . On a train: Always face forward and sit near a window.  . In a vehicle: Sit in the front seat; if you are the passenger, look at the scenery in the distance. For some people, driving the vehicle (rather than being a passenger) is an instant remedy.  . Avoid others who have become nauseous with motion sickness. Seeing and smelling others who have motion sickness may cause you to become sick.  GET HELP RIGHT AWAY IF:   Your symptoms do not improve or worsen within 2 days  after treatment.   You cannot keep down fluids after trying the medication.   Other associated symptoms such as severe headache, visual field changes, fever, or intractable nausea and vomiting.  MAKE SURE YOU:   Understand these instructions.  Will watch your condition.  Will get help right away if you are not doing well or get worse.  Thank you for choosing an e-visit.  Your e-visit answers were reviewed by a board certified advanced clinical practitioner to complete your personal care plan. Depending upon the condition, your plan could have included both over the counter or prescription medications.  Please review your pharmacy choice. Be sure that the pharmacy you have chosen is open so that you can pick up your prescription now.  If there is a problem you may message your provider in MyChart to have the prescription routed to another pharmacy.  Your safety is important to us. If you have drug allergies check your prescription carefully.   For the next 24 hours, you can use MyChart to ask questions about today's visit, request a non-urgent call back, or ask for a work or school excuse from your e-visit provider.  You will get an e-mail in the next two days asking about your experience. I hope that your e-visit has been   valuable and will speed your recovery.   References or for more information: https://wwwnc.cdc.gov/travel/yellowbook/2020/travel-by-air-land-sea/motion-sickness https://my.[facility].org/health/articles/12782-motion-sickness https://www.uptodate.com  

## 2021-12-26 NOTE — Progress Notes (Signed)
White blood cells are slightly elevated but have been in this range on previous blood work this year. Not a cause for concern unless she's had recent any signs of infection

## 2022-01-08 ENCOUNTER — Encounter: Payer: Self-pay | Admitting: Neurology

## 2022-01-08 NOTE — Telephone Encounter (Signed)
Received the results from the [facility]. JCV inde value 0.85 and the JCV antibody was positive. Will send this information to Dr Felecia Shelling for review.

## 2022-01-09 DIAGNOSIS — E1065 Type 1 diabetes mellitus with hyperglycemia: Secondary | ICD-10-CM | POA: Diagnosis not present

## 2022-01-10 ENCOUNTER — Telehealth: Payer: Self-pay | Admitting: Neurology

## 2022-01-10 ENCOUNTER — Other Ambulatory Visit: Payer: Self-pay

## 2022-01-10 ENCOUNTER — Other Ambulatory Visit: Payer: Self-pay | Admitting: Neurology

## 2022-01-10 MED ORDER — SODIUM CHLORIDE 0.9% FLUSH: 10.0000 mL | Freq: Once | INTRAVENOUS | Status: DC

## 2022-01-10 MED ORDER — HEPARIN SOD (PORK) LOCK FLUSH 100 UNIT/ML IV SOLN
500.0000 [IU] | Freq: Once | INTRAVENOUS | Status: AC
  Administered 2022-01-15: 500 [IU] via INTRAVENOUS

## 2022-01-10 MED ORDER — SODIUM CHLORIDE 0.9% FLUSH
10.0000 mL | INTRAVENOUS | Status: DC | PRN
  Administered 2022-01-15: 10 mL via INTRAVENOUS

## 2022-01-10 MED ORDER — HEPARIN SOD (PORK) LOCK FLUSH 100 UNIT/ML IV SOLN: 500.0000 [IU] | Freq: Once | INTRAVENOUS | Status: DC

## 2022-01-10 MED ORDER — AMPHETAMINE-DEXTROAMPHETAMINE 10 MG PO TABS: 10.0000 mg | ORAL_TABLET | Freq: Two times a day (BID) | ORAL | 0 refills | Status: DC

## 2022-01-10 NOTE — Telephone Encounter (Signed)
Dr. Felecia Shelling is in office today. He signed order and I faxed it back to [phone]. Received fax confirmation.  Authorized for them the access/de-access port a cath/instill 500U of heparin prior to de-accessing port.   I called back and let them know order signed and faxed back. Spoke w/ Maryruth Hancock. She verbalized understanding and appreciation.

## 2022-01-10 NOTE — Telephone Encounter (Signed)
[facility]) pt has an MRI with [place] on 01/15/22. We need port access orders signed for MRI appt. Could you have a physician to sign and fax to [phone]. Will fax over the order form to [phone]. Would like a call from the nurse.

## 2022-01-10 NOTE — Telephone Encounter (Signed)
Last OV was on 12/12/21.  Next OV is scheduled for 06/25/22.  Last RX was written on 12/12/21 for 60 tabs.   [place] [facility] Database has been reviewed. Please e-scribe as on call MD. Not able to wait til Tuesday.

## 2022-01-15 ENCOUNTER — Ambulatory Visit
Admission: RE | Admit: 2022-01-15 | Discharge: 2022-01-15 | Disposition: A | Discharge status: Home / Self Care | Payer: BC Managed Care – PPO | Source: Ambulatory Visit | Attending: Neurology | Admitting: Neurology

## 2022-01-15 DIAGNOSIS — G35 Multiple sclerosis: Secondary | ICD-10-CM

## 2022-01-15 MED ORDER — GADOBENATE DIMEGLUMINE 529 MG/ML IV SOLN
20.0000 mL | Freq: Once | INTRAVENOUS | Status: AC | PRN
  Administered 2022-01-15: 20 mL via INTRAVENOUS

## 2022-02-03 ENCOUNTER — Telehealth: Payer: BC Managed Care – PPO | Admitting: Family Medicine

## 2022-02-03 DIAGNOSIS — H10022 Other mucopurulent conjunctivitis, left eye: Secondary | ICD-10-CM | POA: Diagnosis not present

## 2022-02-03 MED ORDER — POLYMYXIN B-TRIMETHOPRIM 10000-0.1 UNIT/ML-% OP SOLN: 1.0000 [drp] | Freq: Four times a day (QID) | OPHTHALMIC | 0 refills | Status: DC

## 2022-02-03 NOTE — Progress Notes (Signed)

## 2022-02-05 DIAGNOSIS — G35 Multiple sclerosis: Secondary | ICD-10-CM | POA: Diagnosis not present

## 2022-02-07 DIAGNOSIS — E1065 Type 1 diabetes mellitus with hyperglycemia: Secondary | ICD-10-CM | POA: Diagnosis not present

## 2022-02-12 ENCOUNTER — Other Ambulatory Visit: Payer: Self-pay | Admitting: Diagnostic Neuroimaging

## 2022-02-13 MED ORDER — AMPHETAMINE-DEXTROAMPHETAMINE 10 MG PO TABS: 10.0000 mg | ORAL_TABLET | Freq: Two times a day (BID) | ORAL | 0 refills | Status: DC

## 2022-02-13 NOTE — Telephone Encounter (Signed)
Last OV was on 12/12/21.  Next OV is scheduled for 06/25/22.  Last RX was written on 01/11/22 for 60 tabs.   [place] [facility] Database has been reviewed. Please e-scribe as work in MD. Dr. Epimenio Foot is out.

## 2022-03-10 ENCOUNTER — Other Ambulatory Visit: Payer: Self-pay | Admitting: Neurology

## 2022-03-10 DIAGNOSIS — E1065 Type 1 diabetes mellitus with hyperglycemia: Secondary | ICD-10-CM | POA: Diagnosis not present

## 2022-03-11 ENCOUNTER — Other Ambulatory Visit: Payer: Self-pay | Admitting: Neurology

## 2022-03-13 MED ORDER — AMPHETAMINE-DEXTROAMPHETAMINE 10 MG PO TABS: 10.0000 mg | ORAL_TABLET | Freq: Two times a day (BID) | ORAL | 0 refills | Status: DC

## 2022-03-19 DIAGNOSIS — G35 Multiple sclerosis: Secondary | ICD-10-CM | POA: Diagnosis not present

## 2022-04-01 DIAGNOSIS — Z794 Long term (current) use of insulin: Secondary | ICD-10-CM | POA: Diagnosis not present

## 2022-04-01 DIAGNOSIS — E109 Type 1 diabetes mellitus without complications: Secondary | ICD-10-CM | POA: Diagnosis not present

## 2022-04-04 DIAGNOSIS — E109 Type 1 diabetes mellitus without complications: Secondary | ICD-10-CM | POA: Diagnosis not present

## 2022-04-04 DIAGNOSIS — J209 Acute bronchitis, unspecified: Secondary | ICD-10-CM | POA: Diagnosis not present

## 2022-04-04 DIAGNOSIS — R059 Cough, unspecified: Secondary | ICD-10-CM | POA: Diagnosis not present

## 2022-04-07 DIAGNOSIS — G35 Multiple sclerosis: Secondary | ICD-10-CM | POA: Diagnosis not present

## 2022-04-07 DIAGNOSIS — N6314 Unspecified lump in the right breast, lower inner quadrant: Secondary | ICD-10-CM | POA: Diagnosis not present

## 2022-04-07 DIAGNOSIS — Z6836 Body mass index (BMI) 36.0-36.9, adult: Secondary | ICD-10-CM | POA: Diagnosis not present

## 2022-04-08 ENCOUNTER — Telehealth: Payer: BC Managed Care – PPO | Admitting: Physician Assistant

## 2022-04-08 ENCOUNTER — Other Ambulatory Visit: Payer: Self-pay | Admitting: Neurology

## 2022-04-08 DIAGNOSIS — T3695XA Adverse effect of unspecified systemic antibiotic, initial encounter: Secondary | ICD-10-CM

## 2022-04-08 DIAGNOSIS — B379 Candidiasis, unspecified: Secondary | ICD-10-CM

## 2022-04-08 MED ORDER — FLUCONAZOLE 150 MG PO TABS: 150.0000 mg | ORAL_TABLET | Freq: Once | ORAL | 0 refills | Status: AC

## 2022-04-08 NOTE — Progress Notes (Signed)

## 2022-04-08 NOTE — Progress Notes (Signed)
I have spent 5 minutes in review of e-visit questionnaire, review and updating patient chart, medical decision making and response to patient.   Yonatan Guitron Cody Mischele Detter, PA-C    

## 2022-04-09 DIAGNOSIS — N6314 Unspecified lump in the right breast, lower inner quadrant: Secondary | ICD-10-CM | POA: Diagnosis not present

## 2022-04-09 DIAGNOSIS — G35 Multiple sclerosis: Secondary | ICD-10-CM | POA: Diagnosis not present

## 2022-04-09 DIAGNOSIS — E1065 Type 1 diabetes mellitus with hyperglycemia: Secondary | ICD-10-CM | POA: Diagnosis not present

## 2022-04-09 DIAGNOSIS — N644 Mastodynia: Secondary | ICD-10-CM | POA: Diagnosis not present

## 2022-04-10 MED ORDER — MECLIZINE HCL 25 MG PO TABS: 25.0000 mg | ORAL_TABLET | Freq: Three times a day (TID) | ORAL | 0 refills | Status: AC | PRN

## 2022-04-14 ENCOUNTER — Other Ambulatory Visit: Payer: Self-pay | Admitting: Neurology

## 2022-04-15 MED ORDER — AMPHETAMINE-DEXTROAMPHETAMINE 10 MG PO TABS: 10.0000 mg | ORAL_TABLET | Freq: Two times a day (BID) | ORAL | 0 refills | Status: DC

## 2022-04-15 NOTE — Telephone Encounter (Signed)
Per drug registry, last refilled 03/14/22 #60. Last seen 12/12/21 and next f/u 06/25/22.

## 2022-04-17 DIAGNOSIS — E785 Hyperlipidemia, unspecified: Secondary | ICD-10-CM | POA: Diagnosis not present

## 2022-04-17 DIAGNOSIS — D751 Secondary polycythemia: Secondary | ICD-10-CM | POA: Diagnosis not present

## 2022-04-17 DIAGNOSIS — Z6836 Body mass index (BMI) 36.0-36.9, adult: Secondary | ICD-10-CM | POA: Diagnosis not present

## 2022-04-17 DIAGNOSIS — E1069 Type 1 diabetes mellitus with other specified complication: Secondary | ICD-10-CM | POA: Diagnosis not present

## 2022-04-17 DIAGNOSIS — Z113 Encounter for screening for infections with a predominantly sexual mode of transmission: Secondary | ICD-10-CM | POA: Diagnosis not present

## 2022-04-17 DIAGNOSIS — Z1331 Encounter for screening for depression: Secondary | ICD-10-CM | POA: Diagnosis not present

## 2022-04-17 DIAGNOSIS — Z0000 Encounter for general adult medical examination without abnormal findings: Secondary | ICD-10-CM | POA: Diagnosis not present

## 2022-04-19 DIAGNOSIS — Z3202 Encounter for pregnancy test, result negative: Secondary | ICD-10-CM | POA: Diagnosis not present

## 2022-04-19 DIAGNOSIS — R1012 Left upper quadrant pain: Secondary | ICD-10-CM | POA: Diagnosis not present

## 2022-04-19 DIAGNOSIS — Z20822 Contact with and (suspected) exposure to covid-19: Secondary | ICD-10-CM | POA: Diagnosis not present

## 2022-04-19 DIAGNOSIS — R109 Unspecified abdominal pain: Secondary | ICD-10-CM | POA: Diagnosis not present

## 2022-04-20 DIAGNOSIS — R109 Unspecified abdominal pain: Secondary | ICD-10-CM | POA: Diagnosis not present

## 2022-04-20 DIAGNOSIS — R1012 Left upper quadrant pain: Secondary | ICD-10-CM | POA: Diagnosis not present

## 2022-04-30 ENCOUNTER — Telehealth: Payer: Self-pay | Admitting: Neurology

## 2022-04-30 ENCOUNTER — Other Ambulatory Visit: Payer: BC Managed Care – PPO

## 2022-04-30 DIAGNOSIS — I3139 Other pericardial effusion (noninflammatory): Secondary | ICD-10-CM | POA: Diagnosis not present

## 2022-04-30 DIAGNOSIS — G35 Multiple sclerosis: Secondary | ICD-10-CM | POA: Diagnosis not present

## 2022-04-30 DIAGNOSIS — R590 Localized enlarged lymph nodes: Secondary | ICD-10-CM | POA: Diagnosis not present

## 2022-04-30 DIAGNOSIS — D751 Secondary polycythemia: Secondary | ICD-10-CM | POA: Diagnosis not present

## 2022-04-30 NOTE — Telephone Encounter (Signed)
Placed JCV lab in [facility] lock box for routine lab pick up. Results pending. 

## 2022-05-01 DIAGNOSIS — Z6836 Body mass index (BMI) 36.0-36.9, adult: Secondary | ICD-10-CM | POA: Diagnosis not present

## 2022-05-01 DIAGNOSIS — E1065 Type 1 diabetes mellitus with hyperglycemia: Secondary | ICD-10-CM | POA: Diagnosis not present

## 2022-05-01 DIAGNOSIS — R1013 Epigastric pain: Secondary | ICD-10-CM | POA: Diagnosis not present

## 2022-05-01 DIAGNOSIS — E785 Hyperlipidemia, unspecified: Secondary | ICD-10-CM | POA: Diagnosis not present

## 2022-05-01 DIAGNOSIS — K861 Other chronic pancreatitis: Secondary | ICD-10-CM | POA: Diagnosis not present

## 2022-05-01 DIAGNOSIS — E10319 Type 1 diabetes mellitus with unspecified diabetic retinopathy without macular edema: Secondary | ICD-10-CM | POA: Diagnosis not present

## 2022-05-01 DIAGNOSIS — E1069 Type 1 diabetes mellitus with other specified complication: Secondary | ICD-10-CM | POA: Diagnosis not present

## 2022-05-07 NOTE — Telephone Encounter (Signed)
JCV ab drawn 04/30/22 positive, index: 0.69.

## 2022-05-09 DIAGNOSIS — E1065 Type 1 diabetes mellitus with hyperglycemia: Secondary | ICD-10-CM | POA: Diagnosis not present

## 2022-05-15 ENCOUNTER — Other Ambulatory Visit: Payer: Self-pay | Admitting: Neurology

## 2022-05-15 MED ORDER — AMPHETAMINE-DEXTROAMPHETAMINE 10 MG PO TABS: 10.0000 mg | ORAL_TABLET | Freq: Two times a day (BID) | ORAL | 0 refills | Status: DC

## 2022-05-15 NOTE — Telephone Encounter (Signed)
Pt has an up coming appt and been checked on the registry. 

## 2022-05-16 DIAGNOSIS — E1065 Type 1 diabetes mellitus with hyperglycemia: Secondary | ICD-10-CM | POA: Diagnosis not present

## 2022-06-05 DIAGNOSIS — E1065 Type 1 diabetes mellitus with hyperglycemia: Secondary | ICD-10-CM | POA: Diagnosis not present

## 2022-06-09 DIAGNOSIS — E1065 Type 1 diabetes mellitus with hyperglycemia: Secondary | ICD-10-CM | POA: Diagnosis not present

## 2022-06-12 ENCOUNTER — Telehealth: Payer: BC Managed Care – PPO | Admitting: Nurse Practitioner

## 2022-06-12 DIAGNOSIS — U071 COVID-19: Secondary | ICD-10-CM

## 2022-06-12 DIAGNOSIS — G35 Multiple sclerosis: Secondary | ICD-10-CM | POA: Diagnosis not present

## 2022-06-12 MED ORDER — NIRMATRELVIR/RITONAVIR (PAXLOVID)TABLET: 3.0000 | ORAL_TABLET | Freq: Two times a day (BID) | ORAL | 0 refills | Status: AC

## 2022-06-12 NOTE — Progress Notes (Signed)
Virtual Visit Consent   Danielle Baker, you are scheduled for a virtual visit with Mary-Margaret Daphine Deutscher, FNP, a [facility] provider, today.     Just as with appointments in the office, your consent must be obtained to participate.  Your consent will be active for this visit and any virtual visit you may have with one of our providers in the next 365 days.     If you have a MyChart account, a copy of this consent can be sent to you electronically.  All virtual visits are billed to your insurance company just like a traditional visit in the office.    As this is a virtual visit, video technology does not allow for your provider to perform a traditional examination.  This may limit your provider's ability to fully assess your condition.  If your provider identifies any concerns that need to be evaluated in person or the need to arrange testing (such as labs, EKG, etc.), we will make arrangements to do so.     Although advances in technology are sophisticated, we cannot ensure that it will always work on either your end or our end.  If the connection with a video visit is poor, the visit may have to be switched to a telephone visit.  With either a video or telephone visit, we are not always able to ensure that we have a secure connection.     I need to obtain your verbal consent now.   Are you willing to proceed with your visit today? YES   Danielle Baker has provided verbal consent on 06/12/2022 for a virtual visit (video or telephone).   Mary-Margaret Daphine Deutscher, FNP   Date: 06/12/2022 7:18 PM   Virtual Visit via Video Note   I, Mary-Margaret Daphine Deutscher, connected with Danielle Baker ([id number], 1995-09-13) on 06/12/22 at  7:30 PM EDT by a video-enabled telemedicine application and verified that I am speaking with the correct person using two identifiers.  Location: Patient: Virtual Visit Location Patient: Home Provider: Virtual Visit Location Provider: Mobile   I discussed the limitations of  evaluation and management by telemedicine and the availability of in person appointments. The patient expressed understanding and agreed to proceed.    History of Present Illness: Danielle Baker is a 26 y.o. who identifies as a female who was assigned female at birth, and is being seen today for covid psoitive.  HPI: Covid positive  URI  This is a new problem. The current episode started yesterday. The problem has been gradually worsening. The maximum temperature recorded prior to her arrival was 101 - 101.9 F. Associated symptoms include congestion, coughing, headaches, rhinorrhea and a sore throat. Pertinent negatives include no swollen glands. She has tried acetaminophen and NSAIDs (robitussin) for the symptoms. The treatment provided no relief.    Review of Systems  HENT:  Positive for congestion, rhinorrhea and sore throat.   Respiratory:  Positive for cough.   Neurological:  Positive for headaches.    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   Exacerbation of multiple sclerosis (HCC) 02/21/2020   Sleepiness 01/04/2020   High risk medication use 11/28/2019   Numbness 11/28/2019   Gait disturbance 11/28/2019   Urinary urgency 11/28/2019   Vitamin D deficiency 11/28/2019   Multiple sclerosis (HCC) 11/07/2019   History of cesarean  delivery 08/06/2018   Palpitations 03/30/2018   Maternal varicella, non-immune 01/04/2018   Pancreatitis 05/21/2016   Diabetes mellitus complicating pregnancy in third trimester, antepartum 03/18/2016   DKA (diabetic ketoacidoses) 08/31/2013   Childhood obesity 07/08/2012   Obesity, morbid (HCC)    Tachycardia    Hypertension associated with diabetes (HCC)    Type 1 diabetes mellitus without complication (HCC) 12/16/2010    Allergies:  Allergies  Allergen Reactions   Morphine And  Related Rash   Medications:  Current Outpatient Medications:    amphetamine-dextroamphetamine (ADDERALL) 10 MG tablet, Take 1 tablet (10 mg total) by mouth 2 (two) times daily with a meal., Disp: 60 tablet, Rfl: 0   meclizine (ANTIVERT) 25 MG tablet, Take 1 tablet (25 mg total) by mouth 3 (three) times daily as needed for dizziness., Disp: 90 tablet, Rfl: 0   acetaminophen (TYLENOL) 325 MG tablet, Take 325 mg by mouth every 6 (six) hours as needed for mild pain., Disp: , Rfl:    Cholecalciferol (DIALYVITE VITAMIN D 5000) 125 MCG (5000 UT) capsule, Take 5,000 Units by mouth daily., Disp: , Rfl:    escitalopram (LEXAPRO) 20 MG tablet, Take 1.5 tablets (30 mg total) by mouth daily., Disp: 45 tablet, Rfl: 1   ibuprofen (ADVIL) 200 MG tablet, Take 200 mg by mouth every 6 (six) hours as needed for moderate pain., Disp: , Rfl:    insulin aspart (NOVOLOG) 100 UNIT/ML injection, Inject 10-23 Units into the skin 3 (three) times daily before meals., Disp: , Rfl:    insulin degludec (TRESIBA FLEXTOUCH) 200 UNIT/ML FlexTouch Pen, Inject 40 Units into the skin 2 (two) times daily., Disp: , Rfl:    levonorgestrel (MIRENA, 52 MG,) 20 MCG/DAY IUD, Mirena 20 mcg/24 hours (7 yrs) 52 mg intrauterine device  provided by [facility], Disp: , Rfl:    natalizumab (TYSABRI) 300 MG/15ML injection, Inject 300 mg as directed every 30 (thirty) days., Disp: , Rfl:    ondansetron (ZOFRAN-ODT) 4 MG disintegrating tablet, DISSOLVE 1 TABLET(4 MG) ON THE TONGUE EVERY 8 HOURS AS NEEDED, Disp: 20 tablet, Rfl: 6   scopolamine (TRANSDERM-SCOP) 1 MG/3DAYS, Place 1 patch (1.5 mg total) onto the skin every 3 (three) days., Disp: 4 patch, Rfl: 0   Semaglutide,0.25 or 0.5MG /DOS, (OZEMPIC, 0.25 OR 0.5 MG/DOSE,) 2 MG/1.5ML SOPN, Inject 0.5 mg into the skin every Tuesday., Disp: , Rfl:    trimethoprim-polymyxin b (POLYTRIM) ophthalmic solution, Place 1-2 drops into the left eye in the morning, at noon, in the evening, and at bedtime., Disp: 10  mL, Rfl: 0   zonisamide (ZONEGRAN) 100 MG capsule, Take 1 capsule (100 mg total) by mouth at bedtime., Disp: 90 capsule, Rfl: 1  Observations/Objective: Patient is well-developed, well-nourished in no acute distress.  Resting comfortably  at home.  Head is normocephalic, atraumatic.  No labored breathing.  Speech is clear and coherent with logical content.  Patient is alert and oriented at baseline.  Raspy voice Deep wet cough  Assessment and Plan:  [name] in today with chief complaint of Covid Positive   1. Positive self-administered antigen test for COVID-19 1. Take meds as prescribed 2. Use a cool mist humidifier especially during the winter months and when heat has been humid. 3. Use saline nose sprays frequently 4. Saline irrigations of the nose can be very helpful if done frequently.  * 4X daily for 1 week*  * Use of a nettie pot can be helpful with this. Follow directions with this* 5. Drink  plenty of fluids 6. Keep thermostat turn down low 7.For any cough or congestion- robitussin OTC 8. For fever or aces or pains- take tylenol or ibuprofen appropriate for age and weight.  * for fevers greater than 101 orally you may alternate ibuprofen and tylenol every  3 hours.    Meds ordered this encounter  Medications   nirmatrelvir/ritonavir EUA (PAXLOVID) 20 x 150 MG & 10 x 100MG  TABS    Sig: Take 3 tablets by mouth 2 (two) times daily for 5 days. (Take nirmatrelvir 150 mg two tablets twice daily for 5 days and ritonavir 100 mg one tablet twice daily for 5 days) Patient GFR is >90    Dispense:  30 tablet    Refill:  0    Order Specific Question:   Supervising Provider    Answer:   Chase Picket [[id number]]     Follow Up Instructions: I discussed the assessment and treatment plan with the patient. The patient was provided an opportunity to ask questions and all were answered. The patient agreed with the plan and demonstrated an understanding of the instructions.  A  copy of instructions were sent to the patient via MyChart.  The patient was advised to call back or seek an in-person evaluation if the symptoms worsen or if the condition fails to improve as anticipated.  Time:  I spent 7 minutes with the patient via telehealth technology discussing the above problems/concerns.    Mary-Margaret Hassell Done, FNP

## 2022-06-12 NOTE — Patient Instructions (Signed)
Danielle Baker, thank you for joining Chevis Pretty, FNP for today's virtual visit.  While this provider is not your primary care provider (PCP), if your PCP is located in our provider database this encounter information will be shared with them immediately following your visit.   [facility] account gives you access to today's visit and all your visits, tests, and labs performed at [facility] " click here if you don't have a [facility] account or go to [email or web address]  Consent: (Patient) Danielle Baker provided verbal consent for this virtual visit at the beginning of the encounter.  Current Medications:  Current Outpatient Medications:    amphetamine-dextroamphetamine (ADDERALL) 10 MG tablet, Take 1 tablet (10 mg total) by mouth 2 (two) times daily with a meal., Disp: 60 tablet, Rfl: 0   meclizine (ANTIVERT) 25 MG tablet, Take 1 tablet (25 mg total) by mouth 3 (three) times daily as needed for dizziness., Disp: 90 tablet, Rfl: 0   nirmatrelvir/ritonavir EUA (PAXLOVID) 20 x 150 MG & 10 x 100MG  TABS, Take 3 tablets by mouth 2 (two) times daily for 5 days. (Take nirmatrelvir 150 mg two tablets twice daily for 5 days and ritonavir 100 mg one tablet twice daily for 5 days) Patient GFR is >90, Disp: 30 tablet, Rfl: 0   acetaminophen (TYLENOL) 325 MG tablet, Take 325 mg by mouth every 6 (six) hours as needed for mild pain., Disp: , Rfl:    Cholecalciferol (DIALYVITE VITAMIN D 5000) 125 MCG (5000 UT) capsule, Take 5,000 Units by mouth daily., Disp: , Rfl:    escitalopram (LEXAPRO) 20 MG tablet, Take 1.5 tablets (30 mg total) by mouth daily., Disp: 45 tablet, Rfl: 1   ibuprofen (ADVIL) 200 MG tablet, Take 200 mg by mouth every 6 (six) hours as needed for moderate pain., Disp: , Rfl:    insulin aspart (NOVOLOG) 100 UNIT/ML injection, Inject 10-23 Units into the skin 3 (three) times daily before meals., Disp: , Rfl:    insulin degludec (TRESIBA FLEXTOUCH) 200  UNIT/ML FlexTouch Pen, Inject 40 Units into the skin 2 (two) times daily., Disp: , Rfl:    levonorgestrel (MIRENA, 52 MG,) 20 MCG/DAY IUD, Mirena 20 mcg/24 hours (7 yrs) 52 mg intrauterine device  provided by [facility], Disp: , Rfl:    natalizumab (TYSABRI) 300 MG/15ML injection, Inject 300 mg as directed every 30 (thirty) days., Disp: , Rfl:    ondansetron (ZOFRAN-ODT) 4 MG disintegrating tablet, DISSOLVE 1 TABLET(4 MG) ON THE TONGUE EVERY 8 HOURS AS NEEDED, Disp: 20 tablet, Rfl: 6   scopolamine (TRANSDERM-SCOP) 1 MG/3DAYS, Place 1 patch (1.5 mg total) onto the skin every 3 (three) days., Disp: 4 patch, Rfl: 0   Semaglutide,0.25 or 0.5MG /DOS, (OZEMPIC, 0.25 OR 0.5 MG/DOSE,) 2 MG/1.5ML SOPN, Inject 0.5 mg into the skin every Tuesday., Disp: , Rfl:    trimethoprim-polymyxin b (POLYTRIM) ophthalmic solution, Place 1-2 drops into the left eye in the morning, at noon, in the evening, and at bedtime., Disp: 10 mL, Rfl: 0   zonisamide (ZONEGRAN) 100 MG capsule, Take 1 capsule (100 mg total) by mouth at bedtime., Disp: 90 capsule, Rfl: 1   Medications ordered in this encounter:  Meds ordered this encounter  Medications   nirmatrelvir/ritonavir EUA (PAXLOVID) 20 x 150 MG & 10 x 100MG  TABS    Sig: Take 3 tablets by mouth 2 (two) times daily for 5 days. (Take nirmatrelvir 150 mg two tablets twice daily for 5 days and ritonavir 100 mg  one tablet twice daily for 5 days) Patient GFR is >90    Dispense:  30 tablet    Refill:  0    Order Specific Question:   Supervising Provider    Answer:   Merrilee Jansky [[id number]]     *If you need refills on other medications prior to your next appointment, please contact your pharmacy*  Follow-Up: Call back or seek an in-person evaluation if the symptoms worsen or if the condition fails to improve as anticipated.  [facility] [phone]  Other Instructions 1. Take meds as prescribed 2. Use a cool mist humidifier especially during the winter  months and when heat has been humid. 3. Use saline nose sprays frequently 4. Saline irrigations of the nose can be very helpful if done frequently.  * 4X daily for 1 week*  * Use of a nettie pot can be helpful with this. Follow directions with this* 5. Drink plenty of fluids 6. Keep thermostat turn down low 7.For any cough or congestion- robitussin 8. For fever or aces or pains- take tylenol or ibuprofen appropriate for age and weight.  * for fevers greater than 101 orally you may alternate ibuprofen and tylenol every  3 hours.      If you have been instructed to have an in-person evaluation today at a local Urgent Care facility, please use the link below. It will take you to a list of all of our available [facility], including address, phone number and hours of operation. Please do not delay care.  [facility]  If you or a family member do not have a primary care provider, use the link below to schedule a visit and establish care. When you choose a [facility] primary care physician or advanced practice provider, you gain a long-term partner in health. Find a Primary Care Provider  Learn more about [place]'s in-office and virtual care options: [facility] - Get Care Now

## 2022-06-17 ENCOUNTER — Other Ambulatory Visit: Payer: Self-pay | Admitting: Neurology

## 2022-06-17 MED ORDER — AMPHETAMINE-DEXTROAMPHETAMINE 10 MG PO TABS: 10.0000 mg | ORAL_TABLET | Freq: Two times a day (BID) | ORAL | 0 refills | Status: DC

## 2022-06-25 ENCOUNTER — Ambulatory Visit (INDEPENDENT_AMBULATORY_CARE_PROVIDER_SITE_OTHER): Payer: BC Managed Care – PPO | Admitting: Neurology

## 2022-06-25 ENCOUNTER — Telehealth: Payer: Self-pay | Admitting: *Deleted

## 2022-06-25 ENCOUNTER — Encounter: Payer: Self-pay | Admitting: Neurology

## 2022-06-25 VITALS — BP 136/88 | HR 89 | Ht 64.0 in | Wt 221.5 lb

## 2022-06-25 DIAGNOSIS — R41840 Attention and concentration deficit: Secondary | ICD-10-CM

## 2022-06-25 DIAGNOSIS — E109 Type 1 diabetes mellitus without complications: Secondary | ICD-10-CM | POA: Diagnosis not present

## 2022-06-25 DIAGNOSIS — G35 Multiple sclerosis: Secondary | ICD-10-CM | POA: Diagnosis not present

## 2022-06-25 DIAGNOSIS — Z79899 Other long term (current) drug therapy: Secondary | ICD-10-CM | POA: Diagnosis not present

## 2022-06-25 DIAGNOSIS — R269 Unspecified abnormalities of gait and mobility: Secondary | ICD-10-CM

## 2022-06-25 NOTE — Telephone Encounter (Signed)
Placed JCV lab in [facility] lock box for routine lab pick up. Results pending. 

## 2022-06-25 NOTE — Progress Notes (Signed)
[facility]  PATIENT: Danielle Baker DOB: Jan 08, 1996  REFERRING DOCTOR OR PCP:   Abner Greenspan, MD (PCP) SOURCE: Patient, notes from both hospital admissions, imaging and laboratory reports, MRI images personally reviewed.  _________________________________   HISTORICAL  CHIEF COMPLAINT:  Chief Complaint  Patient presents with   Follow-up    Danielle Baker in room #1 and alone. Danielle Baker here today for f/u on Tysabri for her MS.    HISTORY OF PRESENT ILLNESS:  Update 06/25/2022:   Danielle Baker is on Tysabri and has tolerated it well with no exacerbation.  Unfortunately, Danielle Baker is low positive and is on q 6 weeks.   Danielle Baker had one titer of 1 but las titer was 0.54.      We discussed her risk of PML.   Initially, Danielle Baker did Ocrevus but felt poorly on it.   Danielle Baker was very tired on Ocrevus so switched to Tysabri in March 2022.     Gait is doing well with mild reduced balance .  Danielle Baker usually does not need to use the bannister  Danielle Baker feels gai is mildly better than it was 2 years ago.   Danielle Baker is able to walk a mile.     Danielle Baker experiences  left arm > face and leg numbness that is stable but the more painful dysesthesias resolved.    Vertigo is better but Danielle Baker sometimes needs meclizne and ondansetron      Fatigue improved on Tysabri and with Adderall.   Danielle Baker has stable depression and anxiety. Danielle Baker is on Lexapro.  Danielle Baker sleeps well at night now that Danielle Baker no longer takes naps.    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  In retrospect Danielle Baker had some dysesthetic sensation in her trunk 2 weeks earlier.    Danielle Baker went to [facility] and was admitted for IV  Solumedrol.   Two weeks later, Danielle Baker had left visual changes  and more numbness bilaterally but more on her right side and also had left facial numbness.   Danielle Baker has a Lhermitte sign into the right leg with the second episode last week.    Danielle Baker received 3 days IV Solu-Medrol and noted improvement but feels the Lhermitte sign and numbness are worse since then.   Danielle Baker started Ocrevus June/July 2021.     Danielle Baker has no FH of MS.    Data reviewed: MRI brain 06/25/2022 showed T2/FLAIR hyperintense foci in the hemispheres, brainstem and cerebellum in a pattern and configuration consistent with chronic demyelinating plaque associated with multiple sclerosis.  None of the foci enhanced or appear to be acute.  Compared to the MRI from 06/20/2020, there were no new lesions.    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  None of the foci appear  to be acute.  Compared to the MRI from 11/07/2019, there do not appear to be any new lesions.  MRI cervical spine 06/20/2020 showed T2 hyperintense foci within the spinal cord posteriorly adjacent to C3-C4, to the left adjacent to T1 and adjacent to T2 consistent with chronic demyelinating plaque associated with multiple sclerosis.  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  There are also very minimal stable disc bulges at C3-C4 and C5-C6.  There is no nerve root compression.  MRI thoracic spine 06/20/2020 showed Foci within the spinal cord adjacent to T1 and T3-T4 as detailed above.  A third focus adjacent to T2 was noted on the cervical spine images but not clearly apparent on the thoracic spine images.  All  3 of these foci were present on MRIs from 11/07/2019 and all consistent with chronic demyelinating plaque associated with multiple sclerosis.  There are no new lesions.  MRI of the cervical and thoracic spine 11/07/2019 showed enhancing foci at C3C4 posteriorly to the left and T1 and non-enhancing foci centrally at C1-C2, T2 centrally and T3T4 towards the right.   Brain MRI showed scattered foci in the MCP's, left cerebellar hemisphere, pons, left thalamus and periventricular and juxtacortical white matter.   Several foci in the hemispheres and in the left middle cerebellar peduncle enhanced after contrast.    MRI of the brain and orbits 11/22/2019 showed an additional enhancing lesion in the left posterior lateral pons.    Orbits were normal.    Labs showed negative HIV and NMO Ab.   Vit D was 11 (now being supplemented).   REVIEW OF SYSTEMS: Constitutional: No fevers, chills, sweats, or change in appetite.  Danielle Baker has fatigue. Eyes: No visual changes, double vision, eye pain Ear, nose and throat: No hearing loss, ear pain, nasal congestion, sore throat Cardiovascular: No chest pain, palpitations Respiratory:  No shortness of breath at rest or with exertion.   No wheezes GastrointestinaI: No nausea, vomiting, diarrhea, abdominal pain, fecal incontinence Genitourinary:   Urinary urgency and frequency. Musculoskeletal:  No neck pain, back pain Integumentary: No rash, pruritus, skin lesions Neurological: as above Psychiatric: Anxiety and mild depression. Endocrine: Danielle Baker is a type I diabetic. Hematologic/Lymphatic:  No anemia, purpura, petechiae. Allergic/Immunologic: No itchy/runny eyes, nasal congestion, recent allergic reactions, rashes  ALLERGIES: Allergies  Allergen Reactions   Morphine And Related Rash    HOME MEDICATIONS:  Current Outpatient Medications:    acetaminophen (TYLENOL) 325 MG tablet, Take 325 mg by mouth every 6 (six) hours as needed for mild pain., Disp: , Rfl:     amphetamine-dextroamphetamine (ADDERALL) 10 MG tablet, Take 1 tablet (10 mg total) by mouth 2 (two) times daily with a meal., Disp: 60 tablet, Rfl: 0   Cholecalciferol (DIALYVITE VITAMIN D 5000) 125 MCG (5000 UT) capsule, Take 5,000 Units by mouth daily., Disp: , Rfl:    escitalopram (LEXAPRO) 20 MG tablet, Take 1.5 tablets (30 mg total) by mouth daily., Disp: 45 tablet, Rfl: 1   ibuprofen (ADVIL) 200 MG tablet, Take 200 mg by mouth every 6 (six) hours as needed for moderate pain., Disp: , Rfl:    insulin aspart (NOVOLOG) 100 UNIT/ML injection, Inject 10-23 Units into the skin 3 (three) times daily before meals., Disp: , Rfl:    insulin degludec (TRESIBA FLEXTOUCH) 200 UNIT/ML FlexTouch Pen, Inject 40 Units into the skin 2 (two) times daily., Disp: ,  Rfl:    levonorgestrel (MIRENA, 52 MG,) 20 MCG/DAY IUD, Mirena 20 mcg/24 hours (7 yrs) 52 mg intrauterine device  provided by [facility], Disp: , Rfl:    meclizine (ANTIVERT) 25 MG tablet, Take 1 tablet (25 mg total) by mouth 3 (three) times daily as needed for dizziness., Disp: 90 tablet, Rfl: 0   natalizumab (TYSABRI) 300 MG/15ML injection, Inject 300 mg as directed every 30 (thirty) days., Disp: , Rfl:    ondansetron (ZOFRAN-ODT) 4 MG disintegrating tablet, DISSOLVE 1 TABLET(4 MG) ON THE TONGUE EVERY 8 HOURS AS NEEDED, Disp: 20 tablet, Rfl: 6   scopolamine (TRANSDERM-SCOP) 1 MG/3DAYS, Place 1 patch (1.5 mg total) onto the skin every 3 (three) days., Disp: 4 patch, Rfl: 0   Semaglutide,0.25 or 0.5MG /DOS, (OZEMPIC, 0.25 OR 0.5 MG/DOSE,) 2 MG/1.5ML SOPN, Inject 0.5 mg into the skin every Tuesday., Disp: , Rfl:    trimethoprim-polymyxin b (POLYTRIM) ophthalmic solution, Place 1-2 drops into the left eye in the morning, at noon, in the evening, and at bedtime., Disp: 10 mL, Rfl: 0   zonisamide (ZONEGRAN) 100 MG capsule, Take 1 capsule (100 mg total) by mouth at bedtime., Disp: 90 capsule, Rfl: 1  PAST MEDICAL HISTORY: Past Medical History:  Diagnosis  Date   Heart murmur    as a child   Hypoglycemia associated with diabetes (HCC)    Multiple sclerosis (HCC)    Obesity, morbid (HCC)    Pancreatitis    Tachycardia    Type 1 diabetes mellitus not at goal [facility])    since age of 26 years old    PAST SURGICAL HISTORY: Past Surgical History:  Procedure Laterality Date   CESAREAN SECTION N/A 03/17/2016   Procedure: CESAREAN SECTION;  Surgeon: Marlow Baars, MD;  Location: [facility];  Service: Obstetrics;  Laterality: N/A;  REQUEST RNFA   CESAREAN SECTION N/A 08/06/2018   Procedure: CESAREAN SECTION;  Surgeon: Marlow Baars, MD;  Location: [facility] BIRTHING SUIT[facility];  Service: Obstetrics;  Laterality: N/A;  RNFA AVAILABLE   CHOLECYSTECTOMY     IR IMAGING GUIDED PORT INSERTION  11/02/2020    FAMILY HISTORY: Family History  Problem Relation Age of Onset   Thyroid disease Mother    Obesity Mother    Diabetes Father    Obesity Father    Obesity Sister    Cancer Maternal Grandmother    Obesity Paternal Grandfather     SOCIAL HISTORY:  Social History   Socioeconomic History   Marital status: Married    Spouse name: Not on file   Number of children: Not on file   Years of education: Not on file   Highest education level: Not on file  Occupational History   Not on file  Tobacco Use   Smoking status: Never   Smokeless tobacco: Never  Vaping Use   Vaping Use: Never used  Substance and Sexual Activity   Alcohol use: No    Alcohol/week: 0.0 standard drinks of alcohol   Drug use: No   Sexual activity: Yes    Birth control/protection: None  Other Topics Concern   Not on file  Social History Narrative   Right handed   Caffeine use: none   Newly married   [employer] - online   Works part time at a daycare   Works at a [employer] time   Social Determinants of [employer]: Low Risk  (07/29/2018)   Overall Financial Resource Strain (CARDIA)    Difficulty of  Paying Living Expenses: Not  hard at all  Food Insecurity: No Food Insecurity (07/29/2018)   Hunger Vital Sign    Worried About Running Out of Food in the Last Year: Never true    Ran Out of Food in the Last Year: Never true  Transportation Needs: Unknown (07/29/2018)   PRAPARE - Administrator, Civil Service (Medical): No    Lack of Transportation (Non-Medical): Not on file  Physical Activity: Not on file  Stress: Not on file  Social Connections: Not on file  Intimate Partner Violence: Not At Risk (07/29/2018)   Humiliation, Afraid, Rape, and Kick questionnaire    Fear of Current or Ex-Partner: No    Emotionally Abused: No    Physically Abused: No    Sexually Abused: No     PHYSICAL EXAM  Vitals:   06/25/22 1333  BP: 136/88  Pulse: 89  Weight: 221 lb 8 oz (100.5 kg)  Height: 5\' 4"  (1.626 m)    Body mass index is 38.02 kg/m.  No results found.   General: The patient is well-developed and well-nourished and in no acute distress  HEENT:  Head is [place]/AT.  Sclera are anicteric.     Skin: Extremities are without rash or edema.   Neurologic Exam  Mental status: The patient is alert and oriented x 3 at the time of the examination.  Speech is normal.  Cranial nerves: Extraocular movements are full.   Facial symmetry is present. Danielle Baker reports reduced left facial sensation to soft touch and temperature.    .Facial strength is normal.  Trapezius and sternocleidomastoid strength is normal. No dysarthria is noted.   No obvious hearing deficits are noted.  Motor:  Muscle bulk is normal.   Tone is normal. Strength is  5 / 5 x 4 now  Sensory: Danielle Baker reports reduced touch, temperature and vibration sensation in the left arm and reduced vibratin in left leg  Danielle Baker reports more symmetric le sensation   Coordination: Cerebellar testing reveals good finger-nose-finger right and slightly reduced heel-to-shin bilaterally.  Gait and station: Station is normal.   Gait is fairly normal.   Mildly wide tandem.  .   Romberg is negative.   Reflexes: Deep tendon reflexes are symmetric and normal bilaterally.        DIAGNOSTIC DATA (LABS, IMAGING, TESTING) - I reviewed patient records, labs, notes, testing and imaging myself where available.  Lab Results  Component Value Date   WBC 12.8 (H) 12/25/2021   HGB 15.0 12/25/2021   HCT 45.3 12/25/2021   MCV 90 12/25/2021   PLT 284 12/25/2021      Component Value Date/Time   NA 139 02/23/2020 0436   K 4.2 02/23/2020 0436   CL 111 02/23/2020 0436   CO2 19 (L) 02/23/2020 0436   GLUCOSE 232 (H) 02/23/2020 0436   BUN 15 02/23/2020 0436   CREATININE 0.58 02/23/2020 0436   CREATININE 0.62 03/22/2015 1210   CALCIUM 8.3 (L) 02/23/2020 0436   PROT 6.2 09/24/2020 1345   ALBUMIN 4.0 09/24/2020 1345   AST 14 09/24/2020 1345   ALT 16 09/24/2020 1345   ALKPHOS 123 (H) 09/24/2020 1345   BILITOT 0.3 09/24/2020 1345   GFRNONAA >60 02/23/2020 0436   GFRNONAA >89 03/22/2015 1210   GFRAA >60 02/23/2020 0436   GFRAA >89 03/22/2015 1210   Lab Results  Component Value Date   CHOL 160 03/22/2015   HDL 35.30 (L) 03/22/2015   LDLCALC 95 03/22/2015   TRIG  151.0 (H) 03/22/2015   CHOLHDL 5 03/22/2015   Lab Results  Component Value Date   HGBA1C 10.0 (H) 02/21/2020   Lab Results  Component Value Date   VITAMINB12 229 11/07/2019   Lab Results  Component Value Date   TSH 2.716 11/07/2019       ASSESSMENT AND PLAN  Multiple sclerosis (HCC) - Plan: Stratify JCV Antibody Test ([facility]), CBC with Differential/Platelet  High risk medication use - Plan: Stratify JCV Antibody Test ([facility]), CBC with Differential/Platelet  Type 1 diabetes mellitus without complication (HCC)  Gait disturbance  Attention deficit  1.   Continue Tysabri every 6 weeks.  Danielle Baker has had a few JCV positive readings, most of been around 0.5 but 1 was at 1.0.  Last 1 was 0.54.  As long as Danielle Baker remains under 0.9 in general, it is reasonable to continue the Tysabri.  However, if the titer  goes up and stays up we will need to consider switching to Briumvi) (as has difficulty tolerating OCrevus) or different medication. 2.   Continue Adderall for weight loss, ADD and sleepiness/fatigue 3.  rtc 6 months, sooner if new or worsening neurologic function   Lakevia Perris A. Epimenio Foot, MD, [facility] 06/25/2022, 2:37 PM Certified in Neurology, Clinical Neurophysiology, Sleep Medicine and Neuroimaging  [facility] [address], [address] [place], [place] [address] [phone]

## 2022-06-26 LAB — CBC WITH DIFFERENTIAL/PLATELET
Basophils Absolute: 0.1 10*3/uL (ref 0.0–0.2)
Basos: 1 %
EOS (ABSOLUTE): 0.4 10*3/uL (ref 0.0–0.4)
Eos: 3 %
Hematocrit: 45.5 % (ref 34.0–46.6)
Hemoglobin: 15.1 g/dL (ref 11.1–15.9)
Immature Grans (Abs): 0.1 10*3/uL (ref 0.0–0.1)
Immature Granulocytes: 1 %
Lymphocytes Absolute: 5 10*3/uL — ABNORMAL HIGH (ref 0.7–3.1)
Lymphs: 47 %
MCH: 28.9 pg (ref 26.6–33.0)
MCHC: 33.2 g/dL (ref 31.5–35.7)
MCV: 87 fL (ref 79–97)
Monocytes Absolute: 0.6 10*3/uL (ref 0.1–0.9)
Monocytes: 6 %
Neutrophils Absolute: 4.4 10*3/uL (ref 1.4–7.0)
Neutrophils: 42 %
Platelets: 319 10*3/uL (ref 150–450)
RBC: 5.23 x10E6/uL (ref 3.77–5.28)
RDW: 12.8 % (ref 11.7–15.4)
WBC: 10.5 10*3/uL (ref 3.4–10.8)

## 2022-07-07 NOTE — Telephone Encounter (Signed)
JCV drawn on 06/25/22 positive, index: 0.51.

## 2022-07-17 ENCOUNTER — Other Ambulatory Visit: Payer: Self-pay | Admitting: Neurology

## 2022-07-19 ENCOUNTER — Other Ambulatory Visit: Payer: Self-pay | Admitting: Neurology

## 2022-07-21 MED ORDER — AMPHETAMINE-DEXTROAMPHETAMINE 10 MG PO TABS: 10.0000 mg | ORAL_TABLET | Freq: Two times a day (BID) | ORAL | 0 refills | Status: DC

## 2022-07-21 NOTE — Telephone Encounter (Signed)
Duplicate adderall order.

## 2022-07-24 DIAGNOSIS — G35 Multiple sclerosis: Secondary | ICD-10-CM | POA: Diagnosis not present

## 2022-07-24 DIAGNOSIS — E109 Type 1 diabetes mellitus without complications: Secondary | ICD-10-CM | POA: Diagnosis not present

## 2022-08-04 DIAGNOSIS — G35 Multiple sclerosis: Secondary | ICD-10-CM | POA: Diagnosis not present

## 2022-08-04 DIAGNOSIS — E10319 Type 1 diabetes mellitus with unspecified diabetic retinopathy without macular edema: Secondary | ICD-10-CM | POA: Diagnosis not present

## 2022-08-04 DIAGNOSIS — E1065 Type 1 diabetes mellitus with hyperglycemia: Secondary | ICD-10-CM | POA: Diagnosis not present

## 2022-08-04 DIAGNOSIS — K861 Other chronic pancreatitis: Secondary | ICD-10-CM | POA: Diagnosis not present

## 2022-08-08 DIAGNOSIS — E1065 Type 1 diabetes mellitus with hyperglycemia: Secondary | ICD-10-CM | POA: Diagnosis not present

## 2022-08-20 DIAGNOSIS — S40021A Contusion of right upper arm, initial encounter: Secondary | ICD-10-CM | POA: Diagnosis not present

## 2022-08-25 ENCOUNTER — Other Ambulatory Visit: Payer: Self-pay

## 2022-08-25 ENCOUNTER — Other Ambulatory Visit: Payer: Self-pay | Admitting: Neurology

## 2022-08-25 MED ORDER — AMPHETAMINE-DEXTROAMPHETAMINE 10 MG PO TABS: 10.0000 mg | ORAL_TABLET | Freq: Two times a day (BID) | ORAL | 0 refills | Status: DC

## 2022-08-25 MED ORDER — WEGOVY 1.7 MG/0.75ML ~~LOC~~ SOAJ
1.7000 mg | SUBCUTANEOUS | 3 refills | Status: DC
  Filled 2022-08-25: qty 3, 28d supply, fill #0

## 2022-09-08 ENCOUNTER — Other Ambulatory Visit: Payer: Self-pay | Admitting: *Deleted

## 2022-09-08 ENCOUNTER — Other Ambulatory Visit: Payer: BC Managed Care – PPO

## 2022-09-08 ENCOUNTER — Telehealth: Payer: Self-pay | Admitting: *Deleted

## 2022-09-08 DIAGNOSIS — G35 Multiple sclerosis: Secondary | ICD-10-CM

## 2022-09-08 DIAGNOSIS — Z79899 Other long term (current) drug therapy: Secondary | ICD-10-CM | POA: Diagnosis not present

## 2022-09-08 DIAGNOSIS — Z0289 Encounter for other administrative examinations: Secondary | ICD-10-CM

## 2022-09-08 NOTE — Telephone Encounter (Signed)
Placed JCV lab in [facility] lock box for routine lab pick up. Results pending. 

## 2022-09-09 LAB — CBC WITH DIFFERENTIAL/PLATELET
Basophils Absolute: 0.1 10*3/uL (ref 0.0–0.2)
Basos: 1 %
EOS (ABSOLUTE): 0.4 10*3/uL (ref 0.0–0.4)
Eos: 4 %
Hematocrit: 44 % (ref 34.0–46.6)
Hemoglobin: 15.1 g/dL (ref 11.1–15.9)
Immature Grans (Abs): 0 10*3/uL (ref 0.0–0.1)
Immature Granulocytes: 0 %
Lymphocytes Absolute: 4.2 10*3/uL — ABNORMAL HIGH (ref 0.7–3.1)
Lymphs: 49 %
MCH: 29 pg (ref 26.6–33.0)
MCHC: 34.3 g/dL (ref 31.5–35.7)
MCV: 85 fL (ref 79–97)
Monocytes Absolute: 0.6 10*3/uL (ref 0.1–0.9)
Monocytes: 6 %
Neutrophils Absolute: 3.5 10*3/uL (ref 1.4–7.0)
Neutrophils: 40 %
Platelets: 306 10*3/uL (ref 150–450)
RBC: 5.2 x10E6/uL (ref 3.77–5.28)
RDW: 13.1 % (ref 11.7–15.4)
WBC: 8.7 10*3/uL (ref 3.4–10.8)

## 2022-09-14 DIAGNOSIS — M25511 Pain in right shoulder: Secondary | ICD-10-CM | POA: Diagnosis not present

## 2022-09-15 NOTE — Telephone Encounter (Signed)
JCV antibody returned positive index value 0.64.

## 2022-10-06 ENCOUNTER — Other Ambulatory Visit: Payer: Self-pay | Admitting: Neurology

## 2022-10-07 MED ORDER — AMPHETAMINE-DEXTROAMPHETAMINE 10 MG PO TABS: 10.0000 mg | ORAL_TABLET | Freq: Two times a day (BID) | ORAL | 0 refills | Status: DC

## 2022-10-07 NOTE — Telephone Encounter (Signed)
Pt last seen on 07/07/22 Follow up scheduled on 12/20/22 Rx last filled on

## 2022-10-20 DIAGNOSIS — G35 Multiple sclerosis: Secondary | ICD-10-CM | POA: Diagnosis not present

## 2022-10-27 ENCOUNTER — Telehealth: Payer: BC Managed Care – PPO | Admitting: Physician Assistant

## 2022-10-27 DIAGNOSIS — M549 Dorsalgia, unspecified: Secondary | ICD-10-CM | POA: Diagnosis not present

## 2022-10-27 MED ORDER — CYCLOBENZAPRINE HCL 10 MG PO TABS: 5.0000 mg | ORAL_TABLET | Freq: Three times a day (TID) | ORAL | 0 refills | Status: DC | PRN

## 2022-10-27 MED ORDER — NAPROXEN 500 MG PO TABS: 500.0000 mg | ORAL_TABLET | Freq: Two times a day (BID) | ORAL | 0 refills | Status: DC

## 2022-10-27 NOTE — Progress Notes (Signed)

## 2022-10-31 ENCOUNTER — Other Ambulatory Visit: Payer: Self-pay | Admitting: Neurology

## 2022-11-03 DIAGNOSIS — K861 Other chronic pancreatitis: Secondary | ICD-10-CM | POA: Diagnosis not present

## 2022-11-03 DIAGNOSIS — L680 Hirsutism: Secondary | ICD-10-CM | POA: Diagnosis not present

## 2022-11-03 DIAGNOSIS — E559 Vitamin D deficiency, unspecified: Secondary | ICD-10-CM | POA: Diagnosis not present

## 2022-11-03 DIAGNOSIS — G35 Multiple sclerosis: Secondary | ICD-10-CM | POA: Diagnosis not present

## 2022-11-03 DIAGNOSIS — E10319 Type 1 diabetes mellitus with unspecified diabetic retinopathy without macular edema: Secondary | ICD-10-CM | POA: Diagnosis not present

## 2022-11-03 DIAGNOSIS — E1065 Type 1 diabetes mellitus with hyperglycemia: Secondary | ICD-10-CM | POA: Diagnosis not present

## 2022-11-03 MED ORDER — AMPHETAMINE-DEXTROAMPHETAMINE 10 MG PO TABS: 10.0000 mg | ORAL_TABLET | Freq: Two times a day (BID) | ORAL | 0 refills | Status: DC

## 2022-11-05 DIAGNOSIS — E1065 Type 1 diabetes mellitus with hyperglycemia: Secondary | ICD-10-CM | POA: Diagnosis not present

## 2022-11-18 DIAGNOSIS — Z124 Encounter for screening for malignant neoplasm of cervix: Secondary | ICD-10-CM | POA: Diagnosis not present

## 2022-11-18 DIAGNOSIS — Z6836 Body mass index (BMI) 36.0-36.9, adult: Secondary | ICD-10-CM | POA: Diagnosis not present

## 2022-11-18 DIAGNOSIS — Z01419 Encounter for gynecological examination (general) (routine) without abnormal findings: Secondary | ICD-10-CM | POA: Diagnosis not present

## 2022-12-01 ENCOUNTER — Other Ambulatory Visit: Payer: Self-pay | Admitting: *Deleted

## 2022-12-01 ENCOUNTER — Telehealth: Payer: Self-pay | Admitting: *Deleted

## 2022-12-01 ENCOUNTER — Other Ambulatory Visit: Payer: Self-pay

## 2022-12-01 DIAGNOSIS — Z79899 Other long term (current) drug therapy: Secondary | ICD-10-CM

## 2022-12-01 DIAGNOSIS — G35 Multiple sclerosis: Secondary | ICD-10-CM | POA: Diagnosis not present

## 2022-12-01 NOTE — Telephone Encounter (Signed)
Placed JCV lab in [facility] lock box for routine lab pick up. Results pending. 

## 2022-12-02 LAB — CBC WITH DIFFERENTIAL/PLATELET
Basophils Absolute: 0.1 10*3/uL (ref 0.0–0.2)
Basos: 0 %
EOS (ABSOLUTE): 0.2 10*3/uL (ref 0.0–0.4)
Eos: 2 %
Hematocrit: 42.1 % (ref 34.0–46.6)
Hemoglobin: 14.6 g/dL (ref 11.1–15.9)
Immature Grans (Abs): 0 10*3/uL (ref 0.0–0.1)
Immature Granulocytes: 0 %
Lymphocytes Absolute: 4.9 10*3/uL — ABNORMAL HIGH (ref 0.7–3.1)
Lymphs: 43 %
MCH: 30.4 pg (ref 26.6–33.0)
MCHC: 34.7 g/dL (ref 31.5–35.7)
MCV: 88 fL (ref 79–97)
Monocytes Absolute: 0.7 10*3/uL (ref 0.1–0.9)
Monocytes: 7 %
Neutrophils Absolute: 5.4 10*3/uL (ref 1.4–7.0)
Neutrophils: 48 %
Platelets: 286 10*3/uL (ref 150–450)
RBC: 4.8 x10E6/uL (ref 3.77–5.28)
RDW: 13.1 % (ref 11.7–15.4)
WBC: 11.4 10*3/uL — ABNORMAL HIGH (ref 3.4–10.8)

## 2022-12-04 ENCOUNTER — Other Ambulatory Visit: Payer: Self-pay | Admitting: Neurology

## 2022-12-04 MED ORDER — AMPHETAMINE-DEXTROAMPHETAMINE 10 MG PO TABS: 10.0000 mg | ORAL_TABLET | Freq: Two times a day (BID) | ORAL | 0 refills | Status: DC

## 2022-12-04 NOTE — Telephone Encounter (Signed)
Pt last seen on 06/25/22 per note "2. Continue Adderall for weight loss, ADD and sleepiness/fatigue   Follow up scheduled on 12/30/22  Last filled on 11/03/22 #60 tablets (30 day supply)  Rx pending to be signed

## 2022-12-08 NOTE — Telephone Encounter (Signed)
JCV ab drawn on 12/01/22 positive, index: 0.47. Gave to MD for review.

## 2022-12-30 ENCOUNTER — Ambulatory Visit (INDEPENDENT_AMBULATORY_CARE_PROVIDER_SITE_OTHER): Payer: BC Managed Care – PPO | Admitting: Neurology

## 2022-12-30 ENCOUNTER — Encounter: Payer: Self-pay | Admitting: Neurology

## 2022-12-30 VITALS — BP 122/79 | HR 82 | Ht 64.0 in | Wt 203.5 lb

## 2022-12-30 DIAGNOSIS — R269 Unspecified abnormalities of gait and mobility: Secondary | ICD-10-CM

## 2022-12-30 DIAGNOSIS — E109 Type 1 diabetes mellitus without complications: Secondary | ICD-10-CM

## 2022-12-30 DIAGNOSIS — G35 Multiple sclerosis: Secondary | ICD-10-CM

## 2022-12-30 DIAGNOSIS — R41840 Attention and concentration deficit: Secondary | ICD-10-CM

## 2022-12-30 DIAGNOSIS — R5383 Other fatigue: Secondary | ICD-10-CM

## 2022-12-30 DIAGNOSIS — Z79899 Other long term (current) drug therapy: Secondary | ICD-10-CM | POA: Diagnosis not present

## 2022-12-30 DIAGNOSIS — R200 Anesthesia of skin: Secondary | ICD-10-CM

## 2022-12-30 MED ORDER — ZONISAMIDE 100 MG PO CAPS: 100.0000 mg | ORAL_CAPSULE | Freq: Every day | ORAL | 3 refills | Status: DC

## 2022-12-30 MED ORDER — AMPHETAMINE-DEXTROAMPHETAMINE 10 MG PO TABS
10.0000 mg | ORAL_TABLET | Freq: Two times a day (BID) | ORAL | 0 refills | Status: DC
Start: 2022-12-30 — End: 2023-01-26

## 2022-12-30 NOTE — Progress Notes (Signed)
[facility]  PATIENT: Danielle Baker DOB: 1995-12-20  REFERRING DOCTOR OR PCP:   Abner Greenspan, MD (PCP) SOURCE: Patient, notes from both hospital admissions, imaging and laboratory reports, MRI images personally reviewed.  _________________________________   HISTORICAL  CHIEF COMPLAINT:  Chief Complaint  Patient presents with   Follow-up    Pt in room 11. Here for MS follow up. Pt report MS stable. No concerns.     HISTORY OF PRESENT ILLNESS:  Update 12/30/2022   She is on Tysabri and has tolerated it well with no exacerbation.  Unfortunately, she is low positive and is on q 6 weeks.   She had one titer of 1.0 but last titer was 0.47 (4/24)    We discussed her risk of PML.   Initially, she did Ocrevus but felt poorly on it.   She was very tired on Ocrevus so switched to Tysabri in March 2022.     Gait is doing well with mild reduced balance .  She usually does not need to use the bannister and can walk 2-3 miles without a rest.   She feels gait is mildly better than it was 2 years ago.   She experiences  left arm > face and leg numbness that is stable but the more painful dysesthesias have resolved.    She gets occasional vertigo and takes meclizine.    Fatigue improved on Tysabri and with Adderall.    She has stable depression and anxiety and she takes Lexapro.  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  Last HgbA1c was 7.3.  Se has lost 60 pounds  Migraines did better on zonisamide.  MS History: She had the rapid onset of  left arm and flank numbness on 11/07/2019, and to a lesser extent in the right arm.  In retrospect she had some dysesthetic sensation in her  trunk 2 weeks earlier.    She went to [facility] and was admitted for IV Solumedrol.   Two weeks later, she had left visual changes  and more numbness bilaterally but more on her right side and also had left facial numbness.   She has a Lhermitte sign into the right leg with the second episode last week.    She received 3 days IV Solu-Medrol and noted improvement but feels the Lhermitte sign and numbness are worse since then.   She started Ocrevus June/July 2021.   She switched to Tysabri in 2023.     She has no FH of MS.    Data reviewed: MRI brain 06/25/2022 showed T2/FLAIR hyperintense foci in the hemispheres, brainstem and cerebellum in a pattern and configuration consistent with chronic demyelinating plaque associated with multiple sclerosis.  None of the foci enhanced or appear to be acute.  Compared to the MRI from 06/20/2020, there were no new lesions.    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  None of the foci appear to be acute.  Compared to the MRI from 11/07/2019, there do not appear to be any new lesions.  MRI cervical spine 06/20/2020 showed T2 hyperintense foci within the spinal cord posteriorly adjacent to C3-C4, to the left adjacent to T1 and adjacent to T2 consistent with chronic demyelinating plaque associated with multiple sclerosis.  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  There are also very minimal stable disc bulges at C3-C4 and C5-C6.  There is no nerve root compression.  MRI thoracic spine 06/20/2020 showed Foci within the spinal cord adjacent to T1 and T3-T4 as detailed above.  A third focus  adjacent to T2 was noted on the cervical spine images but not clearly apparent on the thoracic spine images.  All 3 of these foci were present on MRIs from 11/07/2019 and all consistent with chronic demyelinating plaque associated with multiple sclerosis.  There are no new lesions.  MRI of the cervical and thoracic spine 11/07/2019 showed enhancing foci at C3C4 posteriorly to the left and T1 and non-enhancing foci centrally at C1-C2, T2 centrally and T3T4 towards the right.   Brain MRI showed scattered foci in the MCP's, left cerebellar hemisphere, pons, left thalamus and periventricular and juxtacortical white matter.   Several foci in the hemispheres and in the left middle cerebellar peduncle enhanced after contrast.    MRI of the brain and orbits 11/22/2019 showed an additional enhancing lesion in the left posterior lateral pons.    Orbits were normal.    Labs showed negative HIV and NMO Ab.   Vit D was 11 (now being supplemented).   REVIEW OF SYSTEMS: Constitutional: No fevers, chills, sweats, or change in appetite.  She has fatigue. Eyes: No visual changes, double vision, eye pain Ear, nose and throat: No hearing loss, ear pain, nasal congestion, sore throat Cardiovascular: No chest pain, palpitations Respiratory:  No shortness of breath at rest or with exertion.   No wheezes GastrointestinaI: No nausea, vomiting, diarrhea, abdominal pain, fecal incontinence Genitourinary:   Urinary urgency and frequency. Musculoskeletal:  No neck pain, back pain Integumentary: No rash, pruritus, skin lesions Neurological: as above Psychiatric: Anxiety and mild depression. Endocrine: She is a type I diabetic. Hematologic/Lymphatic:  No anemia, purpura, petechiae. Allergic/Immunologic: No itchy/runny eyes, nasal congestion, recent allergic reactions, rashes  ALLERGIES: Allergies  Allergen Reactions   Morphine And Related Rash    HOME MEDICATIONS:  Current Outpatient Medications:    acetaminophen  (TYLENOL) 325 MG tablet, Take 325 mg by mouth every 6 (six) hours as needed for mild pain., Disp: , Rfl:    Cholecalciferol (DIALYVITE VITAMIN D 5000) 125 MCG (5000 UT) capsule, Take 5,000 Units by mouth daily., Disp: , Rfl:    cyclobenzaprine (FLEXERIL) 10 MG tablet, Take 0.5-1 tablets (5-10 mg total) by mouth 3 (three) times daily as needed., Disp: 30 tablet, Rfl: 0   ibuprofen (ADVIL) 200 MG tablet, Take 200 mg by mouth every 6 (six) hours as needed for moderate pain., Disp: , Rfl:    insulin aspart (NOVOLOG) 100 UNIT/ML injection, Inject 10-23 Units into the skin 3 (three) times daily before meals., Disp: , Rfl:    insulin degludec (TRESIBA FLEXTOUCH) 200 UNIT/ML FlexTouch Pen, Inject 40 Units into the skin 2 (two) times daily., Disp: , Rfl:    levonorgestrel (MIRENA, 52 MG,) 20 MCG/DAY IUD, Mirena 20 mcg/24 hours (7 yrs)  52 mg intrauterine device  provided by [facility], Disp: , Rfl:    meclizine (ANTIVERT) 25 MG tablet, Take 1 tablet (25 mg total) by mouth 3 (three) times daily as needed for dizziness., Disp: 90 tablet, Rfl: 0   naproxen (NAPROSYN) 500 MG tablet, Take 1 tablet (500 mg total) by mouth 2 (two) times daily with a meal., Disp: 30 tablet, Rfl: 0   scopolamine (TRANSDERM-SCOP) 1 MG/3DAYS, Place 1 patch (1.5 mg total) onto the skin every 3 (three) days., Disp: 4 patch, Rfl: 0   tirzepatide (ZEPBOUND) 10 MG/0.5ML Pen, Inject 10 mg into the skin once a week., Disp: , Rfl:    amphetamine-dextroamphetamine (ADDERALL) 10 MG tablet, Take 1 tablet (10 mg total) by mouth 2 (two) times daily with a meal., Disp: 60 tablet, Rfl: 0   escitalopram (LEXAPRO) 20 MG tablet, Take 1.5 tablets (30 mg total) by mouth daily., Disp: 45 tablet, Rfl: 1   natalizumab (TYSABRI) 300 MG/15ML injection, Inject 300 mg as directed every 30 (thirty) days. (Patient not taking: Reported on 12/30/2022), Disp: , Rfl:    Semaglutide,0.25 or 0.5MG /DOS, (OZEMPIC, 0.25 OR 0.5 MG/DOSE,) 2 MG/1.5ML SOPN, Inject 0.5 mg into the  skin every Tuesday. (Patient not taking: Reported on 12/30/2022), Disp: , Rfl:    WEGOVY 1.7 MG/0.75ML SOAJ, Inject 1.7 mg into the skin once a week. (Patient not taking: Reported on 12/30/2022), Disp: 9 mL, Rfl: 3   zonisamide (ZONEGRAN) 100 MG capsule, Take 1 capsule (100 mg total) by mouth at bedtime., Disp: 90 capsule, Rfl: 3  PAST MEDICAL HISTORY: Past Medical History:  Diagnosis Date   Heart murmur    as a child   Hypoglycemia associated with diabetes (HCC)    Multiple sclerosis (HCC)    Obesity, morbid (HCC)    Pancreatitis    Tachycardia    Type 1 diabetes mellitus not at goal [facility])    since age of 26 years old    PAST SURGICAL HISTORY: Past Surgical History:  Procedure Laterality Date   CESAREAN SECTION N/A 03/17/2016   Procedure: CESAREAN SECTION;  Surgeon: Marlow Baars, MD;  Location: [facility];  Service: Obstetrics;  Laterality: N/A;  REQUEST RNFA   CESAREAN SECTION N/A 08/06/2018   Procedure: CESAREAN SECTION;  Surgeon: Marlow Baars, MD;  Location: [facility] SU[facility];  Service: Obstetrics;  Laterality: N/A;  RNFA AVAILABLE   CHOLECYSTECTOMY     IR IMAGING GUIDED PORT INSERTION  11/02/2020    FAMILY HISTORY: Family History  Problem Relation Age of Onset   Thyroid disease Mother    Obesity Mother    Diabetes Father    Obesity Father    Obesity Sister    Cancer Maternal Grandmother    Obesity Paternal Grandfather     SOCIAL HISTORY:  Social History   Socioeconomic History   Marital status: Married    Spouse name: Not on file   Number of children: Not on file   Years of education: Not on file   Highest education level: Not on file  Occupational History   Not on file  Tobacco Use   Smoking status: Never   Smokeless tobacco: Never  Vaping Use   Vaping Use: Never used  Substance and Sexual Activity   Alcohol use: No    Alcohol/week: 0.0 standard drinks of alcohol   Drug use: No   Sexual activity: Yes    Birth control/protection: None  Other  Topics Concern   Not on file  Social History Narrative  Right handed   Caffeine use: none   Newly married   [employer] - online   Works part time at a daycare   Works at a [employer] time   Social Determinants of [employer]: Low Risk  (07/29/2018)   Overall Financial Resource Strain (CARDIA)    Difficulty of Paying Living Expenses: Not hard at all  Food Insecurity: No Food Insecurity (07/29/2018)   Hunger Vital Sign    Worried About Running Out of Food in the Last Year: Never true    Ran Out of Food in the Last Year: Never true  Transportation Needs: Unknown (07/29/2018)   PRAPARE - Administrator, Civil Service (Medical): No    Lack of Transportation (Non-Medical): Not on file  Physical Activity: Not on file  Stress: Not on file  Social Connections: Not on file  Intimate Partner Violence: Not At Risk (07/29/2018)   Humiliation, Afraid, Rape, and Kick questionnaire    Fear of Current or Ex-Partner: No    Emotionally Abused: No    Physically Abused: No    Sexually Abused: No     PHYSICAL EXAM  Vitals:   12/30/22 1518  BP: 122/79  Pulse: 82  Weight: 203 lb 8 oz (92.3 kg)  Height: 5\' 4"  (1.626 m)    Body mass index is 34.93 kg/m.  No results found.   General: The patient is well-developed and well-nourished and in no acute distress  HEENT:  Head is [place]/AT.  Sclera are anicteric.     Skin: Extremities are without rash or edema.   Neurologic Exam  Mental status: The patient is alert and oriented x 3 at the time of the examination.  Speech is normal.  Cranial nerves: Extraocular movements are full.   Facial symmetry is present. She has reduced left facial sensation to soft touch and temperature.    .Facial strength is normal.  Trapezius and sternocleidomastoid strength is normal. No dysarthria is noted.   No obvious hearing deficits are noted.  Motor:  Muscle bulk is normal.   Tone is normal. Strength is  5 /  5.  Sensory: She reports reduced touch, temperature and vibration sensation in the left arm and left leg     Coordination: Cerebellar testing reveals good finger-nose-finger right and slightly reduced heel-to-shin bilaterally.  Gait and station: Station is normal.   Gait is fairly normal.   Mildly wide tandem.  .  Romberg is negative.   Reflexes: Deep tendon reflexes are symmetric and normal bilaterally.        DIAGNOSTIC DATA (LABS, IMAGING, TESTING) - I reviewed patient records, labs, notes, testing and imaging myself where available.  Lab Results  Component Value Date   WBC 11.4 (H) 12/01/2022   HGB 14.6 12/01/2022   HCT 42.1 12/01/2022   MCV 88 12/01/2022   PLT 286 12/01/2022      Component Value Date/Time   NA 139 02/23/2020 0436   K 4.2 02/23/2020 0436   CL 111 02/23/2020 0436   CO2 19 (L) 02/23/2020 0436   GLUCOSE 232 (H) 02/23/2020 0436   BUN 15 02/23/2020 0436   CREATININE 0.58 02/23/2020 0436   CREATININE 0.62 03/22/2015 1210   CALCIUM 8.3 (L) 02/23/2020 0436   PROT 6.2 09/24/2020 1345   ALBUMIN 4.0 09/24/2020 1345   AST 14 09/24/2020 1345   ALT 16 09/24/2020 1345   ALKPHOS 123 (H) 09/24/2020 1345   BILITOT 0.3 09/24/2020 1345  GFRNONAA >60 02/23/2020 0436   GFRNONAA >89 03/22/2015 1210   GFRAA >60 02/23/2020 0436   GFRAA >89 03/22/2015 1210   Lab Results  Component Value Date   CHOL 160 03/22/2015   HDL 35.30 (L) 03/22/2015   LDLCALC 95 03/22/2015   TRIG 151.0 (H) 03/22/2015   CHOLHDL 5 03/22/2015   Lab Results  Component Value Date   HGBA1C 10.0 (H) 02/21/2020   Lab Results  Component Value Date   VITAMINB12 229 11/07/2019   Lab Results  Component Value Date   TSH 2.716 11/07/2019       ASSESSMENT AND PLAN  Multiple sclerosis (HCC)  High risk medication use  Type 1 diabetes mellitus without complication (HCC)  Gait disturbance  Attention deficit  Numbness  Other fatigue  1.   Continue Tysabri every 6 weeks (has been  JCV Ab positive, most recently 0.47)  As long as she remains under 0.9, in general, it is reasonable to continue the Tysabri.  However, if the titer goes up and stays up we will need to consider switching to Briumvi or Kesimpta) (as has difficulty tolerating OCrevus) or different medication. 2.   Continue Adderall for ADD and sleepiness/fatigue 3.  rtc 6 months, sooner if new or worsening neurologic function   Tabbetha Kutscher A. Epimenio Foot, MD, [facility] 12/30/2022, 8:26 PM Certified in Neurology, Clinical Neurophysiology, Sleep Medicine and Neuroimaging  [facility] [address], [address] [place], [place] [address] [phone]

## 2023-01-13 DIAGNOSIS — G35 Multiple sclerosis: Secondary | ICD-10-CM | POA: Diagnosis not present

## 2023-01-26 ENCOUNTER — Other Ambulatory Visit: Payer: Self-pay | Admitting: Neurology

## 2023-01-26 MED ORDER — AMPHETAMINE-DEXTROAMPHETAMINE 10 MG PO TABS: 10.0000 mg | ORAL_TABLET | Freq: Two times a day (BID) | ORAL | 0 refills | Status: DC

## 2023-01-26 NOTE — Telephone Encounter (Signed)
Last seen on 12/30/22 Follow up scheduled on 07/06/23 Last filled on 12/30/22 #60 tablets (30 day supply) Rx pending to be signed

## 2023-02-02 DIAGNOSIS — Z13220 Encounter for screening for lipoid disorders: Secondary | ICD-10-CM | POA: Diagnosis not present

## 2023-02-02 DIAGNOSIS — E1065 Type 1 diabetes mellitus with hyperglycemia: Secondary | ICD-10-CM | POA: Diagnosis not present

## 2023-02-02 DIAGNOSIS — E559 Vitamin D deficiency, unspecified: Secondary | ICD-10-CM | POA: Diagnosis not present

## 2023-02-02 DIAGNOSIS — R634 Abnormal weight loss: Secondary | ICD-10-CM | POA: Diagnosis not present

## 2023-02-02 DIAGNOSIS — K861 Other chronic pancreatitis: Secondary | ICD-10-CM | POA: Diagnosis not present

## 2023-02-02 DIAGNOSIS — G35 Multiple sclerosis: Secondary | ICD-10-CM | POA: Diagnosis not present

## 2023-02-05 DIAGNOSIS — E1065 Type 1 diabetes mellitus with hyperglycemia: Secondary | ICD-10-CM | POA: Diagnosis not present

## 2023-02-12 ENCOUNTER — Telehealth: Payer: BC Managed Care – PPO | Admitting: Physician Assistant

## 2023-02-12 DIAGNOSIS — L700 Acne vulgaris: Secondary | ICD-10-CM

## 2023-02-12 MED ORDER — DOXYCYCLINE HYCLATE 100 MG PO TABS
100.0000 mg | ORAL_TABLET | Freq: Two times a day (BID) | ORAL | 0 refills | Status: DC
Start: 2023-02-12 — End: 2023-07-06

## 2023-02-12 MED ORDER — CLINDAMYCIN PHOS-BENZOYL PEROX 1-5 % EX GEL
Freq: Two times a day (BID) | CUTANEOUS | 0 refills | Status: DC
Start: 2023-02-12 — End: 2023-07-06

## 2023-02-12 NOTE — Progress Notes (Signed)
E-Visit for Acne  We are sorry that you are experiencing this issue.  Here is how we plan to help!  Based on what you shared with me it looks like you have uncomplicated acne.  Acne is a disorder of the hair follicles and oil glands (sebaceous glands). The sebaceous glands secrete oils to keep the skin moist.  When the glands get clogged, it can lead to pimples or cysts.  These cysts may become infected and leave scars. Acne is very common and normally occurs at puberty.  Acne is also inherited.  Your personal care plan consists of the following recommendations:  I recommend that you use a daily cleanser  You may try a topical exfoliator and salicylic acid scrub.  These scrubs have coarse particles that clear your pores but may also irritate your skin.  I have prescribed a topical gel with an antibiotic:  Clindamycin-benzoyl peroxide gel.  This gel should be applied to the affected areas twice a day.  Be sure to read the package insert to understand potential side effects.  I have also prescribed one of the following additional therapies:  Doxycycline an oral antibiotic 100 mg twice a day for 10 days  If excessive dryness or peeling occurs, reduce dose frequency or concentration of the topical scrubs.  If excessive stinging or burning occurs, remove the topical gel with mild soap and water and resume at a lower dose the next day.  Remember oral antibiotics and topical acne treatments may increase your sensitivity to the sun!  HOME CARE: Do not squeeze pimples because that can often lead to infections, worse acne, and scars. Use a moisturizer that contains retinoid or fruit acids that may inhibit the development of new acne lesions. Although there is not a clear link that foods can cause acne, doctors do believe that too many sweets predispose you to skin problems.  GET HELP RIGHT AWAY IF: If your acne gets worse or is not better within 10 days. If you become depressed. If you become  pregnant, discontinue medications and call your OB/GYN.  MAKE SURE YOU: Understand these instructions. Will watch your condition. Will get help right away if you are not doing well or get worse.  Thank you for choosing an e-visit.  Your e-visit answers were reviewed by a board certified advanced clinical practitioner to complete your personal care plan. Depending upon the condition, your plan could have included both over the counter or prescription medications.  Please review your pharmacy choice. Make sure the pharmacy is open so you can pick up prescription now. If there is a problem, you may contact your provider through [employer] and have the prescription routed to another pharmacy.  Your safety is important to [place]. If you have drug allergies check your prescription carefully.   For the next 24 hours you can use MyChart to ask questions about today's visit, request a non-urgent call back, or ask for a work or school excuse. You will get an email in the next two days asking about your experience. I hope that your e-visit has been valuable and will speed your recovery.  I have spent 5 minutes in review of e-visit questionnaire, review and updating patient chart, medical decision making and response to patient.   Margaretann Loveless, PA-C

## 2023-02-24 ENCOUNTER — Other Ambulatory Visit: Payer: Self-pay | Admitting: *Deleted

## 2023-02-24 ENCOUNTER — Other Ambulatory Visit: Payer: Self-pay

## 2023-02-24 ENCOUNTER — Telehealth: Payer: Self-pay | Admitting: *Deleted

## 2023-02-24 DIAGNOSIS — G35 Multiple sclerosis: Secondary | ICD-10-CM

## 2023-02-24 DIAGNOSIS — Z79899 Other long term (current) drug therapy: Secondary | ICD-10-CM

## 2023-02-24 NOTE — Telephone Encounter (Signed)
Placed JCV lab in [facility] lock box for routine lab pick up. Results pending. 

## 2023-02-25 LAB — CBC WITH DIFFERENTIAL/PLATELET
Basophils Absolute: 0.1 10*3/uL (ref 0.0–0.2)
Basos: 1 %
EOS (ABSOLUTE): 0.3 10*3/uL (ref 0.0–0.4)
Eos: 4 %
Hematocrit: 46.9 % — ABNORMAL HIGH (ref 34.0–46.6)
Hemoglobin: 16 g/dL — ABNORMAL HIGH (ref 11.1–15.9)
Immature Grans (Abs): 0 10*3/uL (ref 0.0–0.1)
Immature Granulocytes: 0 %
Lymphocytes Absolute: 5.1 10*3/uL — ABNORMAL HIGH (ref 0.7–3.1)
Lymphs: 55 %
MCH: 30.8 pg (ref 26.6–33.0)
MCHC: 34.1 g/dL (ref 31.5–35.7)
MCV: 90 fL (ref 79–97)
Monocytes Absolute: 0.7 10*3/uL (ref 0.1–0.9)
Monocytes: 7 %
Neutrophils Absolute: 3.1 10*3/uL (ref 1.4–7.0)
Neutrophils: 33 %
Platelets: 237 10*3/uL (ref 150–450)
RBC: 5.19 x10E6/uL (ref 3.77–5.28)
RDW: 12.4 % (ref 11.7–15.4)
WBC: 9.3 10*3/uL (ref 3.4–10.8)

## 2023-02-27 ENCOUNTER — Other Ambulatory Visit: Payer: Self-pay | Admitting: Neurology

## 2023-03-02 MED ORDER — AMPHETAMINE-DEXTROAMPHETAMINE 10 MG PO TABS: 10.0000 mg | ORAL_TABLET | Freq: Two times a day (BID) | ORAL | 0 refills | Status: DC

## 2023-03-02 NOTE — Telephone Encounter (Addendum)
Reported 03-01-2023.   JCV index 0.39 JCV antibody indeterminate.  Inhibition assay NEGATIVE

## 2023-03-20 DIAGNOSIS — H9202 Otalgia, left ear: Secondary | ICD-10-CM | POA: Diagnosis not present

## 2023-03-20 DIAGNOSIS — H6122 Impacted cerumen, left ear: Secondary | ICD-10-CM | POA: Diagnosis not present

## 2023-03-24 ENCOUNTER — Telehealth: Payer: Self-pay

## 2023-03-24 NOTE — Telephone Encounter (Signed)
Faxed Tysabri Reauthorization form to [phone] on 03/24/2023

## 2023-04-02 DIAGNOSIS — E109 Type 1 diabetes mellitus without complications: Secondary | ICD-10-CM | POA: Diagnosis not present

## 2023-04-05 ENCOUNTER — Other Ambulatory Visit: Payer: Self-pay | Admitting: Neurology

## 2023-04-07 DIAGNOSIS — G35 Multiple sclerosis: Secondary | ICD-10-CM | POA: Diagnosis not present

## 2023-04-07 MED ORDER — AMPHETAMINE-DEXTROAMPHETAMINE 10 MG PO TABS: 10.0000 mg | ORAL_TABLET | Freq: Two times a day (BID) | ORAL | 0 refills | Status: DC

## 2023-04-07 NOTE — Telephone Encounter (Signed)
Last seen on 12/30/22 Follow up scheduled on 07/06/23 Last filled on 03/02/23 #60 tablets (30 day supply) Rx pending to be signed

## 2023-04-27 ENCOUNTER — Telehealth: Payer: BC Managed Care – PPO | Admitting: Nurse Practitioner

## 2023-04-27 DIAGNOSIS — T753XXD Motion sickness, subsequent encounter: Secondary | ICD-10-CM

## 2023-04-27 MED ORDER — SCOPOLAMINE 1 MG/3DAYS TD PT72
1.0000 | MEDICATED_PATCH | TRANSDERMAL | 0 refills | Status: DC
Start: 2023-04-27 — End: 2023-07-06

## 2023-04-27 NOTE — Progress Notes (Signed)
E-Visit for Motion Sickness  We are sorry that you are not feeling well. Here is how we plan to help!  Based on what you have shared with me it looks like you have symptoms of motion sickness.  I have prescribed a medication that will help prevent or alleviate your symptoms:  Scopolamine Transdermal 1 mg patch behind ear at least 4 hours prior to travel (preferably 12 hours)   Prevention:  You might feel better if you keep your eyes focused on outside while you are in motion. For example, if you are in a car, sit in the front and look in the direction you are moving; if you are on a boat, stay on the deck and look to the horizon. This helps make what you see match the movement you are feeling, and so you are less likely to feel sick.  You should also avoid reading, watching a movie, texting or reading messages, or looking at things close to you inside the vehicle you are riding in.  Use the seat head rest. Lean your head against the back of the seat or head rest when traveling in vehicles with seats to minimize head movements.  On a ship: When making your reservations, choose a cabin in the middle of the ship and near the waterline. When on board, go up on deck and focus on the horizon.  In an airplane: Request a window seat and look out the window. A seat over the front edge of the wing is the most preferable spot (the degree of motion is the lowest here). Direct the air vent to blow cool air on your face.  On a train: Always face forward and sit near a window.  In a vehicle: Sit in the front seat; if you are the passenger, look at the scenery in the distance. For some people, driving the vehicle (rather than being a passenger) is an instant remedy.  Avoid others who have become nauseous with motion sickness. Seeing and smelling others who have motion sickness may cause you to become sick.  GET HELP RIGHT AWAY IF:  Your symptoms do not improve or worsen within 2 days after  treatment.  You cannot keep down fluids after trying the medication.  Other associated symptoms such as severe headache, visual field changes, fever, or intractable nausea and vomiting.  MAKE SURE YOU:  Understand these instructions. Will watch your condition. Will get help right away if you are not doing well or get worse.  Thank you for choosing an e-visit.  Your e-visit answers were reviewed by a board certified advanced clinical practitioner to complete your personal care plan. Depending upon the condition, your plan could have included both over the counter or prescription medications.  Please review your pharmacy choice. Be sure that the pharmacy you have chosen is open so that you can pick up your prescription now.  If there is a problem you may message your provider in MyChart to have the prescription routed to another pharmacy.  Your safety is important to [place]. If you have drug allergies check your prescription carefully.   For the next 24 hours, you can use MyChart to ask questions about today's visit, request a non-urgent call back, or ask for a work or school excuse from your e-visit provider.  You will get an e-mail in the next two days asking about your experience. I hope that your e-visit has been valuable and will speed your recovery.   References or for more information:  https://cross.com/ https://my.https://rowe.info/ https://www.uptodate.com   Meds ordered this encounter  Medications   scopolamine (TRANSDERM-SCOP) 1 MG/3DAYS    Sig: Place 1 patch (1.5 mg total) onto the skin every 3 (three) days.    Dispense:  10 patch    Refill:  0    I spent approximately 5 minutes reviewing the patient's history, current symptoms and coordinating their care today.

## 2023-05-06 DIAGNOSIS — E1065 Type 1 diabetes mellitus with hyperglycemia: Secondary | ICD-10-CM | POA: Diagnosis not present

## 2023-05-08 ENCOUNTER — Other Ambulatory Visit: Payer: Self-pay | Admitting: Neurology

## 2023-05-12 MED ORDER — AMPHETAMINE-DEXTROAMPHETAMINE 10 MG PO TABS: 10.0000 mg | ORAL_TABLET | Freq: Two times a day (BID) | ORAL | 0 refills | Status: DC

## 2023-05-12 NOTE — Telephone Encounter (Signed)
Last seen 12-30-2022, last fill 04-15-2023 #60.  Next appt 07-06-2023.

## 2023-05-16 ENCOUNTER — Telehealth: Payer: BC Managed Care – PPO

## 2023-05-16 DIAGNOSIS — R399 Unspecified symptoms and signs involving the genitourinary system: Secondary | ICD-10-CM

## 2023-05-17 MED ORDER — CEPHALEXIN 500 MG PO CAPS
500.0000 mg | ORAL_CAPSULE | Freq: Two times a day (BID) | ORAL | 0 refills | Status: DC
Start: 2023-05-17 — End: 2023-07-06

## 2023-05-17 NOTE — Progress Notes (Signed)

## 2023-05-18 DIAGNOSIS — M79661 Pain in right lower leg: Secondary | ICD-10-CM | POA: Diagnosis not present

## 2023-05-18 DIAGNOSIS — M79662 Pain in left lower leg: Secondary | ICD-10-CM | POA: Diagnosis not present

## 2023-05-18 DIAGNOSIS — I83893 Varicose veins of bilateral lower extremities with other complications: Secondary | ICD-10-CM | POA: Diagnosis not present

## 2023-05-18 DIAGNOSIS — M79604 Pain in right leg: Secondary | ICD-10-CM | POA: Diagnosis not present

## 2023-05-18 DIAGNOSIS — M79605 Pain in left leg: Secondary | ICD-10-CM | POA: Diagnosis not present

## 2023-05-19 ENCOUNTER — Telehealth: Payer: Self-pay

## 2023-05-19 ENCOUNTER — Other Ambulatory Visit: Payer: Self-pay

## 2023-05-19 DIAGNOSIS — G35 Multiple sclerosis: Secondary | ICD-10-CM | POA: Diagnosis not present

## 2023-05-19 NOTE — Telephone Encounter (Signed)
JCV Lab in [facility] box 05/19/2023

## 2023-05-20 LAB — CBC WITH DIFFERENTIAL/PLATELET
Basophils Absolute: 0.1 10*3/uL (ref 0.0–0.2)
Basos: 1 %
EOS (ABSOLUTE): 0.3 10*3/uL (ref 0.0–0.4)
Eos: 3 %
Hematocrit: 41.7 % (ref 34.0–46.6)
Hemoglobin: 14 g/dL (ref 11.1–15.9)
Immature Grans (Abs): 0.1 10*3/uL (ref 0.0–0.1)
Immature Granulocytes: 1 %
Lymphocytes Absolute: 4.9 10*3/uL — ABNORMAL HIGH (ref 0.7–3.1)
Lymphs: 47 %
MCH: 30.8 pg (ref 26.6–33.0)
MCHC: 33.6 g/dL (ref 31.5–35.7)
MCV: 92 fL (ref 79–97)
Monocytes Absolute: 0.8 10*3/uL (ref 0.1–0.9)
Monocytes: 8 %
Neutrophils Absolute: 4.1 10*3/uL (ref 1.4–7.0)
Neutrophils: 40 %
Platelets: 312 10*3/uL (ref 150–450)
RBC: 4.55 x10E6/uL (ref 3.77–5.28)
RDW: 12.6 % (ref 11.7–15.4)
WBC: 10.2 10*3/uL (ref 3.4–10.8)

## 2023-06-04 ENCOUNTER — Telehealth: Payer: BC Managed Care – PPO | Admitting: Physician Assistant

## 2023-06-04 DIAGNOSIS — R21 Rash and other nonspecific skin eruption: Secondary | ICD-10-CM | POA: Diagnosis not present

## 2023-06-04 DIAGNOSIS — B355 Tinea imbricata: Secondary | ICD-10-CM | POA: Diagnosis not present

## 2023-06-04 NOTE — Progress Notes (Signed)
Because the two areas of rash look different and it is hard to distinguish the cause without a detailed exam, I feel your condition warrants further evaluation and I recommend that you be seen in a face to face visit.   NOTE: There will be NO CHARGE for this eVisit   If you are having a true medical emergency please call 911.      For an urgent face to face visit, [place] has eight urgent care centers for your convenience:   NEW!! [facility] at [facility] Get Driving Directions [phone] [address] [place], [address]    [facility] Center at [facility] Get Driving Directions [phone] [address] [place], [place] [address]   [facility]) Get Driving Directions [phone] [address] [place], [place] [address]  [facility] Center [facility]) Get Driving Directions [phone] [address] [place],  [place]  [address]  [facility] Health Urgent Care Center [facility] - at [employer]  [phone] [address] [place],  [place] [address]   [facility] at [facility] Get Driving Directions [phone] [address] [place], [place] [address]   [facility] Health Urgent Care at [facility] Get Driving Directions  [phone] [address] [place], [place] [address]   [facility] Health Urgent Care at [facility] Directions [phone] [address] [place], [place] [address]  Your MyChart E-visit questionnaire answers were reviewed by a board certified advanced clinical practitioner to complete your personal care plan based on your specific symptoms.  Thank you for using e-Visits.

## 2023-06-05 ENCOUNTER — Other Ambulatory Visit: Payer: Self-pay | Admitting: Neurology

## 2023-06-05 DIAGNOSIS — G35 Multiple sclerosis: Secondary | ICD-10-CM | POA: Diagnosis not present

## 2023-06-05 DIAGNOSIS — K861 Other chronic pancreatitis: Secondary | ICD-10-CM | POA: Diagnosis not present

## 2023-06-05 DIAGNOSIS — E109 Type 1 diabetes mellitus without complications: Secondary | ICD-10-CM | POA: Diagnosis not present

## 2023-06-05 DIAGNOSIS — E559 Vitamin D deficiency, unspecified: Secondary | ICD-10-CM | POA: Diagnosis not present

## 2023-06-08 MED ORDER — AMPHETAMINE-DEXTROAMPHETAMINE 10 MG PO TABS: 10.0000 mg | ORAL_TABLET | Freq: Two times a day (BID) | ORAL | 0 refills | Status: DC

## 2023-06-08 NOTE — Telephone Encounter (Signed)
Last seen on 12/30/22 Follow up scheduled on 07/06/23 Last filled on 05/12/23 #60 tablets (30 day supply) Rx pending to be signed

## 2023-06-30 NOTE — Patient Instructions (Signed)
Below is our plan:  We will continue current treatment plan.   Please make sure you are staying well hydrated. I recommend 50-60 ounces daily. Well balanced diet and regular exercise encouraged. Consistent sleep schedule with 6-8 hours recommended.   Please continue follow up with care team as directed.   Follow up with Dr Sater in 6 months   You may receive a survey regarding today's visit. I encourage you to leave honest feed back as I do use this information to improve patient care. Thank you for seeing me today!    

## 2023-06-30 NOTE — Progress Notes (Signed)
Chief Complaint  Patient presents with   Follow-up    Pt in room 1, daughters in room. Here for MS follow up DMT: Tysabri . Patient reports doing well, wants to discuss recent JCV.    HISTORY OF PRESENT ILLNESS:  07/06/23 ALL:  Danielle Baker is a 27 y.o. female here today for follow up for RRMS. She continues Tysabri q6wks for low positive JCV. Last JCV 0.38 05/2023.   She reports doing well. No new or exacerbating symptoms. She feels gait is stable. She walks daily. 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   She continues Adderall 10mg  BID. She feels it helps with cognition and fatigue.  She continues meclizine as needed. Vertigo is stable. Headaches are well managed on zonisamide. She has not had any recent headaches. She is sleeping well. Mood is good.   No vision, bowel or bladder changes. She is Type 1 insulin dependant diabetic. Last A1C around 6.4.    HISTORY (copied from Dr Bonnita Hollow previous note)  She is on Tysabri and has tolerated it well with no exacerbation.  Unfortunately, she is low positive and is on q 6 weeks.   She had one titer of 1.0 but last titer was 0.47 (4/24)    We discussed her risk of PML.   Initially, she did Ocrevus but felt poorly on it.   She was very tired on Ocrevus so switched to Tysabri in March 2022.      Gait is doing well with mild reduced balance .  She usually does not need to use the bannister and can walk 2-3 miles without a rest.   She feels gait is mildly better than it was 2 years ago.   She experiences  left arm > face and leg numbness that is stable but the more painful dysesthesias have resolved.    She gets occasional vertigo and takes meclizine.     Fatigue improved on Tysabri and with Adderall.     She has stable depression and anxiety and she takes Lexapro.  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  Last HgbA1c was 7.3.  Se has lost 60 pounds   Migraines did better on zonisamide.   MS History: She had the rapid onset of  left arm and flank numbness on 11/07/2019, and to a lesser extent in the right arm.  In retrospect she had some dysesthetic sensation in her trunk 2 weeks earlier.    She went to [facility] and was admitted for IV Solumedrol.   Two weeks later, she had left visual changes  and more numbness bilaterally but more on her right side and also had left facial numbness.   She has a Lhermitte sign into the right leg with the second episode last week.    She received 3 days IV Solu-Medrol and noted improvement but feels the Lhermitte sign and numbness are worse since then.   She started Ocrevus June/July 2021.   She switched to Tysabri in 2023.      She has no FH of MS.     Data reviewed:  MRI brain 06/25/2022 showed T2/FLAIR hyperintense foci in the hemispheres, brainstem and cerebellum in a pattern and configuration consistent with chronic demyelinating plaque associated with multiple sclerosis.  None of the foci enhanced or appear to be acute.  Compared to the MRI from 06/20/2020, there were no new lesions.    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  None of the foci appear to be acute.  Compared to the MRI from 11/07/2019, there do not appear to be any new lesions.   MRI cervical spine 06/20/2020 showed T2 hyperintense foci within the spinal cord posteriorly adjacent to C3-C4, to the left adjacent to T1 and adjacent to T2  consistent with chronic demyelinating plaque associated with multiple sclerosis.  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  There are also very minimal stable disc bulges at C3-C4 and C5-C6.  There is no nerve root compression.   MRI thoracic spine 06/20/2020 showed Foci within the spinal cord adjacent to T1 and T3-T4 as detailed above.  A third focus adjacent to T2 was noted on the cervical spine images but not clearly apparent on the thoracic spine images.  All 3 of these foci were present on MRIs from 11/07/2019 and all consistent with chronic demyelinating plaque associated with multiple sclerosis.  There are no new lesions.   MRI of the cervical and thoracic spine 11/07/2019 showed enhancing foci at C3C4 posteriorly to the left and T1 and non-enhancing foci centrally at C1-C2, T2 centrally and T3T4 towards the right.   Brain MRI showed scattered foci in the MCP's, left cerebellar hemisphere, pons, left thalamus and periventricular and juxtacortical white matter.   Several foci in the hemispheres and in the left middle cerebellar peduncle enhanced after contrast.    MRI of the brain and orbits 11/22/2019 showed an additional enhancing lesion in the left posterior lateral pons.    Orbits were normal.     Labs showed negative HIV and NMO Ab.   Vit D was 11 (now being supplemented).   REVIEW OF SYSTEMS: Out of a complete 14 system review of symptoms, the patient complains only of the following symptoms, fatigue and all other reviewed systems are negative.   ALLERGIES: Allergies  Allergen Reactions   Morphine And Codeine Rash     HOME MEDICATIONS: Outpatient Medications Prior to Visit  Medication Sig Dispense Refill   acetaminophen (TYLENOL) 325 MG tablet Take 325 mg by mouth every 6 (six) hours as needed for mild pain.     amphetamine-dextroamphetamine (ADDERALL) 10 MG tablet Take 1 tablet (10 mg  total) by mouth 2 (two) times daily with a meal. 60 tablet 0   Cholecalciferol (DIALYVITE VITAMIN D 5000) 125 MCG (5000 UT) capsule Take 5,000 Units by mouth daily.     cyclobenzaprine (FLEXERIL) 10 MG tablet Take 0.5-1 tablets (5-10 mg total) by mouth 3 (three) times daily as needed. 30 tablet 0   ibuprofen (ADVIL) 200 MG tablet Take 200 mg by mouth every 6 (six) hours as needed for moderate pain.     insulin aspart (NOVOLOG) 100 UNIT/ML injection Inject 10-23 Units into the skin 3 (three) times daily before meals.  levonorgestrel (MIRENA, 52 MG,) 20 MCG/DAY IUD Mirena 20 mcg/24 hours (7 yrs) 52 mg intrauterine device  provided by Care Center     meclizine (ANTIVERT) 25 MG tablet Take 1 tablet (25 mg total) by mouth 3 (three) times daily as needed for dizziness. 90 tablet 0   naproxen (NAPROSYN) 500 MG tablet Take 1 tablet (500 mg total) by mouth 2 (two) times daily with a meal. 30 tablet 0   natalizumab (TYSABRI) 300 MG/15ML injection Inject 300 mg as directed every 30 (thirty) days.     tirzepatide (ZEPBOUND) 10 MG/0.5ML Pen Inject 10 mg into the skin once a week.     zonisamide (ZONEGRAN) 100 MG capsule Take 1 capsule (100 mg total) by mouth at bedtime. 90 capsule 3   escitalopram (LEXAPRO) 20 MG tablet Take 1.5 tablets (30 mg total) by mouth daily. 45 tablet 1   cephALEXin (KEFLEX) 500 MG capsule Take 1 capsule (500 mg total) by mouth 2 (two) times daily. (Patient not taking: Reported on 07/06/2023) 14 capsule 0   clindamycin-benzoyl peroxide (BENZACLIN) gel Apply topically 2 (two) times daily. (Patient not taking: Reported on 07/06/2023) 50 g 0   doxycycline (VIBRA-TABS) 100 MG tablet Take 1 tablet (100 mg total) by mouth 2 (two) times daily. (Patient not taking: Reported on 07/06/2023) 20 tablet 0   insulin degludec (TRESIBA FLEXTOUCH) 200 UNIT/ML FlexTouch Pen Inject 40 Units into the skin 2 (two) times daily. (Patient not taking: Reported on 07/06/2023)     scopolamine (TRANSDERM-SCOP) 1  MG/3DAYS Place 1 patch (1.5 mg total) onto the skin every 3 (three) days. (Patient not taking: Reported on 07/06/2023) 10 patch 0   Semaglutide,0.25 or 0.5MG /DOS, (OZEMPIC, 0.25 OR 0.5 MG/DOSE,) 2 MG/1.5ML SOPN Inject 0.5 mg into the skin every Tuesday. (Patient not taking: Reported on 12/30/2022)     WEGOVY 1.7 MG/0.75ML SOAJ Inject 1.7 mg into the skin once a week. (Patient not taking: Reported on 12/30/2022) 9 mL 3   No facility-administered medications prior to visit.     PAST MEDICAL HISTORY: Past Medical History:  Diagnosis Date   Heart murmur    as a child   Hypoglycemia associated with diabetes (HCC)    Multiple sclerosis (HCC)    Obesity, morbid (HCC)    Pancreatitis    Tachycardia    Type 1 diabetes mellitus not at goal [facility])    since age of 27 years old     PAST SURGICAL HISTORY: Past Surgical History:  Procedure Laterality Date   CESAREAN SECTION N/A 03/17/2016   Procedure: CESAREAN SECTION;  Surgeon: Marlow Baars, MD;  Location: [facility];  Service: Obstetrics;  Laterality: N/A;  REQUEST RNFA   CESAREAN SECTION N/A 08/06/2018   Procedure: CESAREAN SECTION;  Surgeon: Marlow Baars, MD;  Location: [facility] BIRTHING SUITES;  Service: Obstetrics;  Laterality: N/A;  RNFA AVAILABLE   CHOLECYSTECTOMY     IR IMAGING GUIDED PORT INSERTION  11/02/2020     FAMILY HISTORY: Family History  Problem Relation Age of Onset   Thyroid disease Mother    Obesity Mother    Diabetes Father    Obesity Father    Obesity Sister    Cancer Maternal Grandmother    Obesity Paternal Grandfather      SOCIAL HISTORY: Social History   Socioeconomic History   Marital status: Married    Spouse name: Not on file   Number of children: Not on file   Years of education: Not on file   Highest education  level: Not on file  Occupational History   Not on file  Tobacco Use   Smoking status: Never   Smokeless tobacco: Never  Vaping Use   Vaping status: Never Used  Substance and Sexual  Activity   Alcohol use: No    Alcohol/week: 0.0 standard drinks of alcohol   Drug use: No   Sexual activity: Yes    Birth control/protection: None  Other Topics Concern   Not on file  Social History Narrative   Right handed   Caffeine use: none   Newly married   [employer] - online   Works part time at a daycare   Works at a [employer] time   Social Determinants of [employer]: Low Risk  (07/29/2018)   Overall Financial Resource Strain (CARDIA)    Difficulty of Paying Living Expenses: Not hard at all  Food Insecurity: No Food Insecurity (07/29/2018)   Hunger Vital Sign    Worried About Running Out of Food in the Last Year: Never true    Ran Out of Food in the Last Year: Never true  Transportation Needs: Unknown (07/29/2018)   PRAPARE - Administrator, Civil Service (Medical): No    Lack of Transportation (Non-Medical): Not on file  Physical Activity: Not on file  Stress: Not on file  Social Connections: Unknown (06/14/2022)   [facility]   Social Network    Social Network: Not on file  Intimate Partner Violence: Unknown (06/14/2022)   [facility]   HITS    Physically Hurt: Not on file    Insult or Talk Down To: Not on file    Threaten Physical Harm: Not on file    Scream or Curse: Not on file     PHYSICAL EXAM  Vitals:   07/06/23 1433  BP: 114/73  Pulse: 89  Weight: 174 lb 8 oz (79.2 kg)  Height: 5\' 4"  (1.626 m)   Body mass index is 29.95 kg/m.  Generalized: Well developed, in no acute distress  Cardiology: normal rate and rhythm, no murmur auscultated  Respiratory: clear to auscultation bilaterally    Neurological examination  Mentation: Alert oriented to time, place, history taking. Follows all commands speech and language fluent Cranial nerve II-XII: Pupils were equal round reactive to light. Extraocular movements were full, visual field were  full on confrontational test. Facial sensation and strength were normal. Uvula tongue midline. Head turning and shoulder shrug  were normal and symmetric. Motor: The motor testing reveals 5 over 5 strength of all 4 extremities. Good symmetric motor tone is noted throughout.  Sensory: Sensory testing is intact to soft touch on all 4 extremities. No evidence of extinction is noted.  Coordination: Cerebellar testing reveals good finger-nose-finger and heel-to-shin bilaterally.  Gait and station: Gait is normal.  Reflexes: Deep tendon reflexes are symmetric and normal bilaterally.    DIAGNOSTIC DATA (LABS, IMAGING, TESTING) - I reviewed patient records, labs, notes, testing and imaging myself where available.  Lab Results  Component Value Date   WBC 10.2 05/19/2023   HGB 14.0 05/19/2023   HCT 41.7 05/19/2023   MCV 92 05/19/2023   PLT 312 05/19/2023      Component Value Date/Time   NA 139 02/23/2020 0436   K 4.2 02/23/2020 0436   CL 111 02/23/2020 0436   CO2 19 (L) 02/23/2020 0436   GLUCOSE 232 (H) 02/23/2020 0436   BUN 15 02/23/2020  0436   CREATININE 0.58 02/23/2020 0436   CREATININE 0.62 03/22/2015 1210   CALCIUM 8.3 (L) 02/23/2020 0436   PROT 6.2 09/24/2020 1345   ALBUMIN 4.0 09/24/2020 1345   AST 14 09/24/2020 1345   ALT 16 09/24/2020 1345   ALKPHOS 123 (H) 09/24/2020 1345   BILITOT 0.3 09/24/2020 1345   GFRNONAA >60 02/23/2020 0436   GFRNONAA >89 03/22/2015 1210   GFRAA >60 02/23/2020 0436   GFRAA >89 03/22/2015 1210   Lab Results  Component Value Date   CHOL 160 03/22/2015   HDL 35.30 (L) 03/22/2015   LDLCALC 95 03/22/2015   TRIG 151.0 (H) 03/22/2015   CHOLHDL 5 03/22/2015   Lab Results  Component Value Date   HGBA1C 10.0 (H) 02/21/2020   Lab Results  Component Value Date   VITAMINB12 229 11/07/2019   Lab Results  Component Value Date   TSH 2.716 11/07/2019        No data to display               No data to display           ASSESSMENT  AND PLAN  27 y.o. year old female  has a past medical history of Heart murmur, Hypoglycemia associated with diabetes (HCC), Multiple sclerosis (HCC), Obesity, morbid (HCC), Pancreatitis, Tachycardia, and Type 1 diabetes mellitus not at goal [facility]). here with    Relapsing remitting multiple sclerosis (HCC)  High risk medication use  Attention deficit  Other fatigue  Type 1 diabetes mellitus without complication (HCC)  Danielle Baker is doing well. We will continue Tysabri infusions every 6 weeks. She will continue Adderall 10mg  BID. PDMP appropriate. She will call when refill needed. She may continue meclizine as needed and will continue zonisamide 100mg  at bedtime for headaches. Healthy lifestyle habits encouraged. She will follow up with PCP as directed. She will return to see Dr Epimenio Foot in 6 months, sooner if needed. She verbalizes understanding and agreement with this plan.   No orders of the defined types were placed in this encounter.    No orders of the defined types were placed in this encounter.    Shawnie Dapper, MSN, FNP-C 07/06/2023, 3:10 PM  [facility] [address], [address] [place], [place] [address] [phone]

## 2023-07-01 ENCOUNTER — Encounter: Payer: Self-pay | Admitting: Neurology

## 2023-07-06 ENCOUNTER — Ambulatory Visit (INDEPENDENT_AMBULATORY_CARE_PROVIDER_SITE_OTHER): Payer: BC Managed Care – PPO | Admitting: Family Medicine

## 2023-07-06 ENCOUNTER — Encounter: Payer: Self-pay | Admitting: Family Medicine

## 2023-07-06 VITALS — BP 114/73 | HR 89 | Ht 64.0 in | Wt 174.5 lb

## 2023-07-06 DIAGNOSIS — E109 Type 1 diabetes mellitus without complications: Secondary | ICD-10-CM

## 2023-07-06 DIAGNOSIS — Z79899 Other long term (current) drug therapy: Secondary | ICD-10-CM | POA: Diagnosis not present

## 2023-07-06 DIAGNOSIS — R5383 Other fatigue: Secondary | ICD-10-CM | POA: Diagnosis not present

## 2023-07-06 DIAGNOSIS — G35 Multiple sclerosis: Secondary | ICD-10-CM

## 2023-07-06 DIAGNOSIS — R41840 Attention and concentration deficit: Secondary | ICD-10-CM | POA: Diagnosis not present

## 2023-07-19 ENCOUNTER — Other Ambulatory Visit: Payer: Self-pay | Admitting: Neurology

## 2023-07-21 MED ORDER — AMPHETAMINE-DEXTROAMPHETAMINE 10 MG PO TABS: 10.0000 mg | ORAL_TABLET | Freq: Two times a day (BID) | ORAL | 0 refills | Status: DC

## 2023-07-21 NOTE — Telephone Encounter (Signed)
Last seen on 07/06/23 Follow up scheduled on 01/21/23 Last filled on 06/18/23 #60 tablets (30 day supply) Rx pending to be signed

## 2023-08-11 ENCOUNTER — Telehealth: Payer: Self-pay

## 2023-08-11 ENCOUNTER — Other Ambulatory Visit: Payer: Self-pay | Admitting: *Deleted

## 2023-08-11 ENCOUNTER — Other Ambulatory Visit: Payer: Self-pay

## 2023-08-11 DIAGNOSIS — Z79899 Other long term (current) drug therapy: Secondary | ICD-10-CM

## 2023-08-11 DIAGNOSIS — G35 Multiple sclerosis: Secondary | ICD-10-CM

## 2023-08-11 NOTE — Telephone Encounter (Signed)
JCV was placed in [facility] box 08/11/2023

## 2023-08-12 LAB — CBC WITH DIFFERENTIAL/PLATELET
Basophils Absolute: 0.1 10*3/uL (ref 0.0–0.2)
Basos: 1 %
EOS (ABSOLUTE): 0.3 10*3/uL (ref 0.0–0.4)
Eos: 3 %
Hematocrit: 41.1 % (ref 34.0–46.6)
Hemoglobin: 13.7 g/dL (ref 11.1–15.9)
Immature Grans (Abs): 0 10*3/uL (ref 0.0–0.1)
Immature Granulocytes: 0 %
Lymphocytes Absolute: 5.5 10*3/uL — ABNORMAL HIGH (ref 0.7–3.1)
Lymphs: 44 %
MCH: 30.1 pg (ref 26.6–33.0)
MCHC: 33.3 g/dL (ref 31.5–35.7)
MCV: 90 fL (ref 79–97)
Monocytes Absolute: 0.9 10*3/uL (ref 0.1–0.9)
Monocytes: 7 %
Neutrophils Absolute: 5.6 10*3/uL (ref 1.4–7.0)
Neutrophils: 45 %
Platelets: 242 10*3/uL (ref 150–450)
RBC: 4.55 x10E6/uL (ref 3.77–5.28)
RDW: 12.1 % (ref 11.7–15.4)
WBC: 12.4 10*3/uL — ABNORMAL HIGH (ref 3.4–10.8)

## 2023-08-14 ENCOUNTER — Other Ambulatory Visit: Payer: Self-pay | Admitting: Neurology

## 2023-08-17 MED ORDER — AMPHETAMINE-DEXTROAMPHETAMINE 10 MG PO TABS: 10.0000 mg | ORAL_TABLET | Freq: Two times a day (BID) | ORAL | 0 refills | Status: DC

## 2023-08-17 NOTE — Telephone Encounter (Signed)
JCV Positive 0.47

## 2023-08-17 NOTE — Telephone Encounter (Signed)
Last seen on 07/06/23 Follow up scheduled on 01/21/24 Last filled on 07/21/23 #60 tablets (30 day supply) Rx pending to be signed

## 2023-09-17 ENCOUNTER — Telehealth: Payer: BC Managed Care – PPO | Admitting: Physician Assistant

## 2023-09-17 DIAGNOSIS — R3989 Other symptoms and signs involving the genitourinary system: Secondary | ICD-10-CM | POA: Diagnosis not present

## 2023-09-17 MED ORDER — CEPHALEXIN 500 MG PO CAPS: 500.0000 mg | ORAL_CAPSULE | Freq: Two times a day (BID) | ORAL | 0 refills | Status: AC

## 2023-09-17 NOTE — Progress Notes (Signed)

## 2023-09-17 NOTE — Progress Notes (Signed)
I have spent 5 minutes in review of e-visit questionnaire, review and updating patient chart, medical decision making and response to patient.   Piedad Climes, PA-C

## 2023-09-19 ENCOUNTER — Other Ambulatory Visit: Payer: Self-pay | Admitting: Neurology

## 2023-09-23 MED ORDER — AMPHETAMINE-DEXTROAMPHETAMINE 10 MG PO TABS: 10.0000 mg | ORAL_TABLET | Freq: Two times a day (BID) | ORAL | 0 refills | Status: DC

## 2023-11-04 ENCOUNTER — Other Ambulatory Visit: Payer: Self-pay | Admitting: *Deleted

## 2023-11-04 DIAGNOSIS — G35 Multiple sclerosis: Secondary | ICD-10-CM

## 2023-11-04 DIAGNOSIS — Z79899 Other long term (current) drug therapy: Secondary | ICD-10-CM

## 2023-11-05 ENCOUNTER — Other Ambulatory Visit

## 2023-11-05 ENCOUNTER — Telehealth: Payer: Self-pay | Admitting: *Deleted

## 2023-11-05 NOTE — Telephone Encounter (Signed)
 Placed JCV lab in [facility] lock box for routine lab pick up. Results pending.

## 2023-11-06 ENCOUNTER — Telehealth: Payer: Self-pay | Admitting: Diagnostic Neuroimaging

## 2023-11-06 LAB — CBC WITH DIFFERENTIAL/PLATELET
Basophils Absolute: 0 10*3/uL (ref 0.0–0.2)
Basos: 1 %
EOS (ABSOLUTE): 0.3 10*3/uL (ref 0.0–0.4)
Eos: 4 %
Hematocrit: 39.1 % (ref 34.0–46.6)
Hemoglobin: 13.3 g/dL (ref 11.1–15.9)
Immature Grans (Abs): 0 10*3/uL (ref 0.0–0.1)
Immature Granulocytes: 0 %
Lymphocytes Absolute: 3.5 10*3/uL — ABNORMAL HIGH (ref 0.7–3.1)
Lymphs: 45 %
MCH: 30.9 pg (ref 26.6–33.0)
MCHC: 34 g/dL (ref 31.5–35.7)
MCV: 91 fL (ref 79–97)
Monocytes Absolute: 0.5 10*3/uL (ref 0.1–0.9)
Monocytes: 7 %
Neutrophils Absolute: 3.4 10*3/uL (ref 1.4–7.0)
Neutrophils: 43 %
RBC: 4.3 x10E6/uL (ref 3.77–5.28)
RDW: 12.4 % (ref 11.7–15.4)
WBC: 7.9 10*3/uL (ref 3.4–10.8)

## 2023-11-06 NOTE — Telephone Encounter (Signed)
 Patient called in with new onset headache, dizziness starting today. Tysabri dose yesterday. Consider ER eval now or over the weekend if sxs worsen, otherwise will plan for MRI brain w/wo next week.    Suanne Marker, MD 11/06/2023, 8:57 PM Certified in Neurology, Neurophysiology and Neuroimaging  [facility] [address] [place], [place] [address] [phone]

## 2023-11-07 ENCOUNTER — Other Ambulatory Visit: Payer: Self-pay | Admitting: Neurology

## 2023-11-09 ENCOUNTER — Ambulatory Visit (INDEPENDENT_AMBULATORY_CARE_PROVIDER_SITE_OTHER): Admitting: Neurology

## 2023-11-09 ENCOUNTER — Encounter: Payer: Self-pay | Admitting: Neurology

## 2023-11-09 VITALS — BP 120/77 | HR 93 | Ht 64.0 in | Wt 167.0 lb

## 2023-11-09 DIAGNOSIS — R42 Dizziness and giddiness: Secondary | ICD-10-CM

## 2023-11-09 DIAGNOSIS — G35D Multiple sclerosis, unspecified: Secondary | ICD-10-CM

## 2023-11-09 DIAGNOSIS — R200 Anesthesia of skin: Secondary | ICD-10-CM

## 2023-11-09 DIAGNOSIS — G35 Multiple sclerosis: Secondary | ICD-10-CM

## 2023-11-09 DIAGNOSIS — R41840 Attention and concentration deficit: Secondary | ICD-10-CM

## 2023-11-09 DIAGNOSIS — G35A Relapsing-remitting multiple sclerosis: Secondary | ICD-10-CM

## 2023-11-09 DIAGNOSIS — Z79899 Other long term (current) drug therapy: Secondary | ICD-10-CM

## 2023-11-09 DIAGNOSIS — R269 Unspecified abnormalities of gait and mobility: Secondary | ICD-10-CM

## 2023-11-09 DIAGNOSIS — I639 Cerebral infarction, unspecified: Secondary | ICD-10-CM | POA: Diagnosis not present

## 2023-11-09 DIAGNOSIS — G43619 Persistent migraine aura with cerebral infarction, intractable, without status migrainosus: Secondary | ICD-10-CM

## 2023-11-09 MED ORDER — AMPHETAMINE-DEXTROAMPHETAMINE 10 MG PO TABS
10.0000 mg | ORAL_TABLET | Freq: Two times a day (BID) | ORAL | 0 refills | Status: DC
Start: 2023-11-09 — End: 2023-12-15

## 2023-11-09 MED ORDER — DEXAMETHASONE SODIUM PHOSPHATE 4 MG/ML IJ SOLN
4.0000 mg | Freq: Once | INTRAMUSCULAR | Status: AC
  Administered 2023-11-09: 4 mg via INTRAVENOUS

## 2023-11-09 MED ORDER — KETOROLAC TROMETHAMINE 60 MG/2ML IM SOLN
60.0000 mg | Freq: Once | INTRAMUSCULAR | Status: AC
  Administered 2023-11-09: 60 mg via INTRAMUSCULAR

## 2023-11-09 NOTE — Telephone Encounter (Signed)
 Danielle Baker

## 2023-11-09 NOTE — Telephone Encounter (Signed)
 Dr. Epimenio Foot- did you want to see her for appt?

## 2023-11-09 NOTE — Telephone Encounter (Signed)
 Last seen on 07/06/23 Follow up scheduled on 01/21/24 Last filled on 09/23/23 #60 tablets (30 day supply) Rx pending to be signed

## 2023-11-09 NOTE — Telephone Encounter (Signed)
 Called pt. She will come in today to see Dr. Epimenio Foot at 4pm. Aware to check in at 3:30pm, bring updated insurance cards and med list.

## 2023-11-09 NOTE — Patient Instructions (Signed)
 Per Dr Epimenio Foot please give patient 4mg /ML Dexamethasone sodium phosphate and toradol 60mg  injections . Gave injections placed bandade on injection sites (right deltoid,right upper outer quadrant) Pt stayed for 5 minutes to make sure no reaction to medication . Dr Epimenio Foot came back to give patient work note . Pt went to check out .

## 2023-11-09 NOTE — Progress Notes (Addendum)
 [facility]  PATIENT: Danielle Baker DOB: 07-Oct-1995  REFERRING DOCTOR OR PCP:   Landry Georgi, MD (PCP) SOURCE: Patient, notes from both hospital admissions, imaging and laboratory reports, MRI images personally reviewed.  _________________________________   HISTORICAL  CHIEF COMPLAINT:  Chief Complaint  Patient presents with   Follow-up    Pt in 10 alone Pt here for MS f/u Pt states right eye blurriness,vertigo. Increased headaches Pt states pressure behind right eye      HISTORY OF PRESENT ILLNESS: 28 year old woman with relapsing remitting MS  Update 11/09/2023   She is on Tysabri  and has tolerated it well with no exacerbation. Last infusion was Thursday last week.  On Friday, she had a HA, pressure sensation and mild eye blurriness.   Vertigo started later in the day.  Vertigo is rotational.   She has nausea but not vomiting.   She called out of work due to the symptoms.    There is no positional component to the vertigo.   She does not have diplopia  She has taken meclizine .   She felt worse on FRiday but feels the same as yesterday.  The headache is now mild.       She has had migraines but has not had vertigo with them in the   She is low positive JCV (0.42) and is on q 6 weeks.   She had one titer of 1.0 but last titer was 0.47 (4/24)    We discussed her risk of PML.   Initially, she did Ocrevus but felt poorly on it.   She was very tired on Ocrevus so switched to Tysabri  in March 2022.     Gait is doing well with mild reduced balance .  She usually does not need to use the bannister and can walk 2-3 miles without a rest.   She feels gait is mildly better than it was 2 years ago.   She experiences  left arm > face and leg numbness that is stable but the more painful dysesthesias have resolved.    She gets occasional vertigo and takes meclizine .    Fatigue has generally done well with Tysabri  and Adderall.  Mood has been doing well.    Cognitive issues like  reduced STM, verbal fluency but these are better with Adderall 10 mg po twice a day.    Migraines did better on zonisamide .  Vit D was low and she is taking 5000 U daily (was on 50000 weekly)    She has Type 1 IDDM and has a new insulin  pump.  Last HgbA1c was 7.3.  Se has lost 60 pounds    MS History: She had the rapid onset of  left arm and flank numbness on 11/07/2019, and to a lesser extent in the right arm.  In retrospect she had some dysesthetic sensation in her trunk 2 weeks earlier.    She went to [facility] and was admitted for IV Solumedrol.   Two weeks later, she had left visual changes  and more numbness bilaterally but more on her right side and also had left facial numbness.   She has a Lhermitte sign into the right leg with the second episode last week.    She received 3 days IV Solu-Medrol  and noted improvement but feels the Lhermitte sign and numbness are worse since then.   She started Ocrevus June/July 2021.   She switched to Tysabri  in 2023.     She has no FH of  MS.    Data reviewed: MRI brain 06/25/2022 showed T2/FLAIR hyperintense foci in the hemispheres, brainstem and cerebellum in a pattern and configuration consistent with chronic demyelinating plaque associated with multiple sclerosis.  None of the foci enhanced or appear to be acute.  Compared to the MRI from 06/20/2020, there were no new lesions.    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  None of the foci appear to be acute.  Compared to the MRI from 11/07/2019, there do not appear to be any new lesions.  MRI cervical spine 06/20/2020 showed T2 hyperintense foci within the spinal cord posteriorly adjacent to C3-C4, to the left adjacent  to T1 and adjacent to T2 consistent with chronic demyelinating plaque associated with multiple sclerosis.  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  There are also very minimal stable disc bulges at C3-C4 and C5-C6.  There is no nerve root compression.  MRI thoracic spine 06/20/2020 showed Foci within the spinal cord adjacent to T1 and T3-T4 as detailed above.  A third focus adjacent to T2 was noted on the cervical spine images but not clearly apparent on the thoracic spine images.  All 3 of these foci were present on MRIs from 11/07/2019 and all consistent with chronic demyelinating plaque associated with multiple sclerosis.  There are no new lesions.  MRI of the cervical and thoracic spine 11/07/2019 showed enhancing foci at C3C4 posteriorly to the left and T1 and non-enhancing foci centrally at C1-C2, T2 centrally and T3T4 towards the right.   Brain MRI showed scattered foci in the MCP's, left cerebellar hemisphere, pons, left thalamus and periventricular and juxtacortical white matter.   Several foci in the hemispheres and in the left middle cerebellar peduncle enhanced after contrast.    MRI of the brain and orbits 11/22/2019 showed an additional enhancing lesion in the left posterior lateral pons.    Orbits were normal.    Labs showed negative HIV and NMO Ab.   Vit D was 11 (now being supplemented).   REVIEW OF SYSTEMS: Constitutional: No fevers, chills, sweats, or change in appetite.  She has fatigue. Eyes: No visual changes, double vision, eye pain Ear, nose and throat: No hearing loss, ear pain, nasal congestion, sore throat Cardiovascular: No chest pain, palpitations Respiratory:  No shortness of breath at rest or with exertion.   No wheezes GastrointestinaI: No nausea, vomiting, diarrhea, abdominal pain, fecal incontinence Genitourinary:   Urinary urgency and frequency. Musculoskeletal:  No neck  pain, back pain Integumentary: No rash, pruritus, skin lesions Neurological: as above Psychiatric: Anxiety and mild depression. Endocrine: She is a type I diabetic. Hematologic/Lymphatic:  No anemia, purpura, petechiae. Allergic/Immunologic: No itchy/runny eyes, nasal congestion, recent allergic reactions, rashes  ALLERGIES: Allergies  Allergen Reactions   Morphine  And Codeine Rash    HOME MEDICATIONS:  Current Outpatient Medications:    acetaminophen  (TYLENOL ) 325 MG tablet, Take 325 mg by mouth every 6 (six) hours as needed for mild pain., Disp: , Rfl:    Cholecalciferol (DIALYVITE VITAMIN D  5000) 125 MCG (5000 UT) capsule, Take 5,000 Units by mouth daily., Disp: , Rfl:    ibuprofen  (ADVIL ) 200 MG  tablet, Take 200 mg by mouth every 6 (six) hours as needed for moderate pain., Disp: , Rfl:    insulin  aspart (NOVOLOG ) 100 UNIT/ML injection, Inject 10-23 Units into the skin 3 (three) times daily before meals., Disp: , Rfl:    levonorgestrel (MIRENA, 52 MG,) 20 MCG/DAY IUD, Mirena 20 mcg/24 hours (7 yrs) 52 mg intrauterine device  provided by [facility], Disp: , Rfl:    meclizine  (ANTIVERT ) 25 MG tablet, Take 1 tablet (25 mg total) by mouth 3 (three) times daily as needed for dizziness., Disp: 90 tablet, Rfl: 0   natalizumab  (TYSABRI ) 300 MG/15ML injection, Inject 300 mg as directed every 30 (thirty) days., Disp: , Rfl:    tirzepatide (ZEPBOUND) 10 MG/0.5ML Pen, Inject 10 mg into the skin once a week., Disp: , Rfl:    ALPRAZolam  (XANAX ) 0.5 MG tablet, Take 2-3 before MRI, Disp: 3 tablet, Rfl: 0   amphetamine -dextroamphetamine  (ADDERALL) 10 MG tablet, Take 1 tablet (10 mg total) by mouth 2 (two) times daily with a meal., Disp: 60 tablet, Rfl: 0   clindamycin -benzoyl peroxide (BENZACLIN) gel, Apply topically 2 (two) times daily., Disp: 25 g, Rfl: 0   ondansetron  (ZOFRAN  ODT) 4 MG disintegrating tablet, Take 1 tablet (4 mg total) by mouth every 8 (eight) hours as needed for nausea or  vomiting., Disp: 20 tablet, Rfl: 5   scopolamine  (TRANSDERM-SCOP) 1 MG/3DAYS, Place 1 patch (1 mg total) onto the skin every 3 (three) days., Disp: 5 patch, Rfl: 0   zonisamide  (ZONEGRAN ) 100 MG capsule, TAKE 1 CAPSULE(100 MG) BY MOUTH AT BEDTIME, Disp: 90 capsule, Rfl: 1  PAST MEDICAL HISTORY: Past Medical History:  Diagnosis Date   Heart murmur    as a child   Hypoglycemia associated with diabetes (HCC)    Multiple sclerosis    Obesity, morbid (HCC)    Pancreatitis    Tachycardia    Type 1 diabetes mellitus not at goal [facility])    since age of 28 years old    PAST SURGICAL HISTORY: Past Surgical History:  Procedure Laterality Date   CESAREAN SECTION N/A 03/17/2016   Procedure: CESAREAN SECTION;  Surgeon: Jolene Gaskins, MD;  Location: [facility] BIRTHING SUITES;  Service: Obstetrics;  Laterality: N/A;  REQUEST RNFA   CESAREAN SECTION N/A 08/06/2018   Procedure: CESAREAN SECTION;  Surgeon: Gaskins Jolene, MD;  Location: [facility] BIRTHING SUITES;  Service: Obstetrics;  Laterality: N/A;  RNFA AVAILABLE   CHOLECYSTECTOMY     IR IMAGING GUIDED PORT INSERTION  11/02/2020    FAMILY HISTORY: Family History  Problem Relation Age of Onset   Thyroid  disease Mother    Obesity Mother    Diabetes Father    Obesity Father    Obesity Sister    Cancer Maternal Grandmother    Obesity Paternal Grandfather    Multiple sclerosis Neg Hx     SOCIAL HISTORY:  Social History   Socioeconomic History   Marital status: Married    Spouse name: Not on file   Number of children: Not on file   Years of education: Not on file   Highest education level: Not on file  Occupational History   Not on file  Tobacco Use   Smoking status: Never   Smokeless tobacco: Never  Vaping Use   Vaping status: Never Used  Substance and Sexual Activity   Alcohol use: No    Alcohol/week: 0.0 standard drinks of alcohol   Drug use: No   Sexual activity: Yes    Birth control/protection:  None  Other Topics Concern   Not on file   Social History Narrative   Right handed   Caffeine  use: none   Newly married   [employer] - online   Works part time at a daycare   Works at a [employer] time   Social Drivers of [employer]: Low Risk  (07/29/2018)   Overall Financial Resource Strain (CARDIA)    Difficulty of Paying Living Expenses: Not hard at all  Food Insecurity: No Food Insecurity (07/29/2018)   Hunger Vital Sign    Worried About Running Out of Food in the Last Year: Never true    Ran Out of Food in the Last Year: Never true  Transportation Needs: Unknown (07/29/2018)   PRAPARE - Administrator, Civil Service (Medical): No    Lack of Transportation (Non-Medical): Not on file  Physical Activity: Not on file  Stress: Not on file  Social Connections: Unknown (06/14/2022)   Received from [facility]   Social Network    Social Network: Not on file  Intimate Partner Violence: Unknown (06/14/2022)   Received from [facility]   HITS    Physically Hurt: Not on file    Insult or Talk Down To: Not on file    Threaten Physical Harm: Not on file    Scream or Curse: Not on file     PHYSICAL EXAM  Vitals:   11/09/23 1603  BP: 120/77  Pulse: 93  Weight: 167 lb (75.8 kg)  Height: 5' 4 (1.626 m)    Body mass index is 28.67 kg/m.  Vision Screening   Right eye Left eye Both eyes  Without correction 20/200 20/30 20/30  With correction 20/200 20/30 20/30     General: The patient is well-developed and well-nourished and in no acute distress  HEENT:  Head is [place]/AT.  Sclera are anicteric.   Tympanic membranes were intact.  She had a small amount of cerumen in the ear canals but it was not blocking the view of the tympanic membrane  Skin: Extremities are without rash or edema.   Neurologic Exam  Mental status: The patient is alert and oriented x 3 at the time of the examination.  Speech is normal.  Cranial nerves: Extraocular movements are full.    Facial symmetry is present. She has mildly reduced left facial sensation to soft touch and temperature.    .Facial strength is normal.  Trapezius and sternocleidomastoid strength is normal. No dysarthria is noted.   No obvious hearing deficits are noted.  Motor:  Muscle bulk is normal.   Tone is normal. Strength is  5 / 5.  Sensory: She reports reduced touch, temperature and vibration sensation in the left arm and left leg     Coordination: Cerebellar testing reveals good finger-nose-finger right and slightly reduced heel-to-shin bilaterally.  Gait and station: Station is normal.   Her gait is fairly normal.  The tandem is mildly wide..  Romberg is negative.   Reflexes: Deep tendon reflexes are symmetric and normal bilaterally.     The Dix-Hallpike maneuver did not elicit significant vertigo.  There was no nystagmus.     DIAGNOSTIC DATA (LABS, IMAGING, TESTING) - I reviewed patient records, labs, notes, testing and imaging myself where available.  Lab Results  Component Value Date   WBC 11.1 (H) 04/27/2024   HGB 15.0 04/27/2024   HCT 44.9 04/27/2024   MCV 93 04/27/2024   PLT 251 04/27/2024  Component Value Date/Time   NA 139 02/23/2020 0436   K 4.2 02/23/2020 0436   CL 111 02/23/2020 0436   CO2 19 (L) 02/23/2020 0436   GLUCOSE 232 (H) 02/23/2020 0436   BUN 15 02/23/2020 0436   CREATININE 0.58 02/23/2020 0436   CREATININE 0.62 03/22/2015 1210   CALCIUM 8.3 (L) 02/23/2020 0436   PROT 6.2 09/24/2020 1345   ALBUMIN 4.0 09/24/2020 1345   AST 14 09/24/2020 1345   ALT 16 09/24/2020 1345   ALKPHOS 123 (H) 09/24/2020 1345   BILITOT 0.3 09/24/2020 1345   GFRNONAA >60 02/23/2020 0436   GFRNONAA >89 03/22/2015 1210   GFRAA >60 02/23/2020 0436   GFRAA >89 03/22/2015 1210   Lab Results  Component Value Date   CHOL 160 03/22/2015   HDL 35.30 (L) 03/22/2015   LDLCALC 95 03/22/2015   TRIG 151.0 (H) 03/22/2015   CHOLHDL 5 03/22/2015   Lab Results  Component Value Date    HGBA1C 10.0 (H) 02/21/2020   Lab Results  Component Value Date   VITAMINB12 229 11/07/2019   Lab Results  Component Value Date   TSH 2.716 11/07/2019       ASSESSMENT AND PLAN  Multiple sclerosis, relapsing-remitting  Multiple sclerosis - Plan: MR BRAIN W WO CONTRAST, ketorolac  (TORADOL ) injection 60 mg, dexamethasone  (DECADRON ) injection 4 mg  Vertigo - Plan: MR BRAIN W WO CONTRAST, ketorolac  (TORADOL ) injection 60 mg, dexamethasone  (DECADRON ) injection 4 mg  High risk medication use  Attention deficit  Numbness  Gait disturbance  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   We will continue Tysabri  every 6 weeks (has been JCV Ab positive, most recently 0.42)  As long as she remains under 0.9, continue the Tysabri .  However, if the titer goes up and stays up we will need to consider switching to Briumvi or Kesimpta) (as has difficulty tolerating OCrevus) or different medication. 2.   Continue Adderall for ADD and sleepiness/fatigue 3.   Decadron  4 mg IM and Toradol  60 mg IM today headache.  Etiology of the vertigo is uncertain.  It is improving.  Possibly migraine related. rtc 6 months, sooner if new or worsening neurologic function   Pawan Knechtel A. Vear, MD, [facility] 06/02/2024, 5:09 PM Certified in Neurology, Clinical Neurophysiology, Sleep Medicine and Neuroimaging  [facility] [address] [place], [place] [address] [phone]

## 2023-11-12 ENCOUNTER — Encounter: Payer: Self-pay | Admitting: Neurology

## 2023-11-12 ENCOUNTER — Other Ambulatory Visit: Payer: Self-pay | Admitting: Neurology

## 2023-11-12 ENCOUNTER — Telehealth: Payer: Self-pay | Admitting: Neurology

## 2023-11-12 MED ORDER — ALPRAZOLAM 0.5 MG PO TABS: ORAL_TABLET | ORAL | 0 refills | Status: DC

## 2023-11-12 NOTE — Telephone Encounter (Signed)
 MRI scheduled @ [facility] for 11/17/23. Pt is asking for something to be called in to help with claustrophobia.

## 2023-11-17 ENCOUNTER — Ambulatory Visit (INDEPENDENT_AMBULATORY_CARE_PROVIDER_SITE_OTHER)

## 2023-11-17 DIAGNOSIS — G35 Multiple sclerosis: Secondary | ICD-10-CM | POA: Diagnosis not present

## 2023-11-17 DIAGNOSIS — R42 Dizziness and giddiness: Secondary | ICD-10-CM

## 2023-11-17 MED ORDER — GADOBENATE DIMEGLUMINE 529 MG/ML IV SOLN
15.0000 mL | Freq: Once | INTRAVENOUS | Status: AC | PRN
  Administered 2023-11-17: 15 mL via INTRAVENOUS

## 2023-11-18 ENCOUNTER — Encounter: Payer: Self-pay | Admitting: Neurology

## 2023-12-02 ENCOUNTER — Other Ambulatory Visit: Payer: Self-pay | Admitting: Neurology

## 2023-12-03 ENCOUNTER — Other Ambulatory Visit: Payer: Self-pay | Admitting: Neurology

## 2023-12-03 MED ORDER — ONDANSETRON 4 MG PO TBDP: 4.0000 mg | ORAL_TABLET | Freq: Three times a day (TID) | ORAL | 5 refills | Status: AC | PRN

## 2023-12-07 ENCOUNTER — Telehealth: Admitting: Family Medicine

## 2023-12-07 DIAGNOSIS — R059 Cough, unspecified: Secondary | ICD-10-CM | POA: Diagnosis not present

## 2023-12-07 MED ORDER — BENZONATATE 100 MG PO CAPS: 100.0000 mg | ORAL_CAPSULE | Freq: Three times a day (TID) | ORAL | 0 refills | Status: AC | PRN

## 2023-12-07 MED ORDER — GUAIFENESIN 200 MG PO TABS: 400.0000 mg | ORAL_TABLET | ORAL | 0 refills | Status: AC | PRN

## 2023-12-07 NOTE — Progress Notes (Signed)
E-Visit for Cough  We are sorry that you are not feeling well.  Here is how we plan to help!  Based on your presentation I believe you most likely have A cough due to a virus.  This is called viral bronchitis and is best treated by rest, plenty of fluids and control of the cough.  You may use Ibuprofen or Tylenol as directed to help your symptoms.     In addition you may use A non-prescription cough medication called Mucinex DM: take 2 tablets every 12 hours. and A prescription cough medication called Tessalon Perles 100mg . You may take 1-2 capsules every 8 hours as needed for your cough.    From your responses in the eVisit questionnaire you describe inflammation in the upper respiratory tract which is causing a significant cough.  This is commonly called Bronchitis and has four common causes:   Allergies Viral Infections Acid Reflux Bacterial Infection Allergies, viruses and acid reflux are treated by controlling symptoms or eliminating the cause. An example might be a cough caused by taking certain blood pressure medications. You stop the cough by changing the medication. Another example might be a cough caused by acid reflux. Controlling the reflux helps control the cough.  USE OF BRONCHODILATOR ("RESCUE") INHALERS: There is a risk from using your bronchodilator too frequently.  The risk is that over-reliance on a medication which only relaxes the muscles surrounding the breathing tubes can reduce the effectiveness of medications prescribed to reduce swelling and congestion of the tubes themselves.  Although you feel brief relief from the bronchodilator inhaler, your asthma may actually be worsening with the tubes becoming more swollen and filled with mucus.  This can delay other crucial treatments, such as oral steroid medications. If you need to use a bronchodilator inhaler daily, several times per day, you should discuss this with your provider.  There are probably better treatments that  could be used to keep your asthma under control.     HOME CARE Only take medications as instructed by your medical team. Complete the entire course of an antibiotic. Drink plenty of fluids and get plenty of rest. Avoid close contacts especially the very young and the elderly Cover your mouth if you cough or cough into your sleeve. Always remember to wash your hands A steam or ultrasonic humidifier can help congestion.   GET HELP RIGHT AWAY IF: You develop worsening fever. You become short of breath You cough up blood. Your symptoms persist after you have completed your treatment plan MAKE SURE YOU  Understand these instructions. Will watch your condition. Will get help right away if you are not doing well or get worse.    Thank you for choosing an e-visit.  Your e-visit answers were reviewed by a board certified advanced clinical practitioner to complete your personal care plan. Depending upon the condition, your plan could have included both over the counter or prescription medications.  Please review your pharmacy choice. Make sure the pharmacy is open so you can pick up prescription now. If there is a problem, you may contact your provider through [employer] and have the prescription routed to another pharmacy.  Your safety is important to [place]. If you have drug allergies check your prescription carefully.   For the next 24 hours you can use MyChart to ask questions about today's visit, request a non-urgent call back, or ask for a work or school excuse. You will get an email in the next two days asking about your  experience. I hope that your e-visit has been valuable and will speed your recovery.  I have spent 5 minutes in review of e-visit questionnaire, review and updating patient chart, medical decision making and response to patient.   Reed Pandy, PA-C

## 2023-12-15 ENCOUNTER — Other Ambulatory Visit: Payer: Self-pay | Admitting: Neurology

## 2023-12-15 MED ORDER — AMPHETAMINE-DEXTROAMPHETAMINE 10 MG PO TABS: 10.0000 mg | ORAL_TABLET | Freq: Two times a day (BID) | ORAL | 0 refills | Status: DC

## 2023-12-15 NOTE — Telephone Encounter (Signed)
 Last seen on 11/09/23 Follow up scheduled on 01/21/24    Dispensed Days Supply Quantity Provider Pharmacy  D-AMPHETAMINE  SALT COMBO 10MG  TAB 11/09/2023 30 60 each Sater, Sherida Dimmer, MD [facility]...     Rx pending to be signed

## 2024-01-13 ENCOUNTER — Telehealth: Admitting: Physician Assistant

## 2024-01-13 DIAGNOSIS — L700 Acne vulgaris: Secondary | ICD-10-CM | POA: Diagnosis not present

## 2024-01-13 MED ORDER — CLINDAMYCIN PHOS-BENZOYL PEROX 1-5 % EX GEL: Freq: Two times a day (BID) | CUTANEOUS | 0 refills | Status: DC

## 2024-01-13 NOTE — Progress Notes (Signed)
 I have spent 5 minutes in review of e-visit questionnaire, review and updating patient chart, medical decision making and response to patient.   Piedad Climes, PA-C

## 2024-01-13 NOTE — Progress Notes (Signed)
 E-Visit for Acne  We are sorry that you are experiencing this issue.  Here is how we plan to help!  Based on what you shared with me it looks like you have uncomplicated acne.  Acne is a disorder of the hair follicles and oil glands (sebaceous glands). The sebaceous glands secrete oils to keep the skin moist.  When the glands get clogged, it can lead to pimples or cysts.  These cysts may become infected and leave scars. Acne is very common and normally occurs at puberty.  Acne is also inherited.  Your personal care plan consists of the following recommendations:  I recommend that you use a daily cleanser  You might try 2% topical salicylic acid pads or wipes.  Use the pads to daily cleanse your skin.  I have prescribed a topical gel with an antibiotic:  Clindamycin-benzoyl peroxide gel.  This gel should be applied to the affected areas twice a day.  Be sure to read the package insert to understand potential side effects.  If excessive dryness or peeling occurs, reduce dose frequency or concentration of the topical scrubs.  If excessive stinging or burning occurs, remove the topical gel with mild soap and water and resume at a lower dose the next day.  Remember oral antibiotics and topical acne treatments may increase your sensitivity to the sun!  HOME CARE: Do not squeeze pimples because that can often lead to infections, worse acne, and scars. Use a moisturizer that contains retinoid or fruit acids that may inhibit the development of new acne lesions. Although there is not a clear link that foods can cause acne, doctors do believe that too many sweets predispose you to skin problems.  GET HELP RIGHT AWAY IF: If your acne gets worse or is not better within 10 days. If you become depressed. If you become pregnant, discontinue medications and call your OB/GYN.  MAKE SURE YOU: Understand these instructions. Will watch your condition. Will get help right away if you are not doing well or  get worse.  Thank you for choosing an e-visit.  Your e-visit answers were reviewed by a board certified advanced clinical practitioner to complete your personal care plan. Depending upon the condition, your plan could have included both over the counter or prescription medications.  Please review your pharmacy choice. Make sure the pharmacy is open so you can pick up prescription now. If there is a problem, you may contact your provider through [employer] and have the prescription routed to another pharmacy.  Your safety is important to [place]. If you have drug allergies check your prescription carefully.   For the next 24 hours you can use MyChart to ask questions about today's visit, request a non-urgent call back, or ask for a work or school excuse. You will get an email in the next two days asking about your experience. I hope that your e-visit has been valuable and will speed your recovery.

## 2024-01-20 ENCOUNTER — Telehealth: Payer: Self-pay | Admitting: Neurology

## 2024-01-20 NOTE — Telephone Encounter (Signed)
 Request made to r/s pt asked appointment be pushed out to Sept.  She took next available appointment for Dec

## 2024-01-21 ENCOUNTER — Ambulatory Visit: Payer: BC Managed Care – PPO | Admitting: Neurology

## 2024-02-03 ENCOUNTER — Other Ambulatory Visit: Payer: Self-pay

## 2024-02-03 ENCOUNTER — Telehealth: Payer: Self-pay | Admitting: *Deleted

## 2024-02-03 ENCOUNTER — Other Ambulatory Visit: Payer: Self-pay | Admitting: *Deleted

## 2024-02-03 DIAGNOSIS — G35 Multiple sclerosis: Secondary | ICD-10-CM

## 2024-02-03 DIAGNOSIS — Z79899 Other long term (current) drug therapy: Secondary | ICD-10-CM

## 2024-02-03 NOTE — Telephone Encounter (Signed)
 Placed JCV lab in [facility] lock box for routine lab pick up. Results pending.

## 2024-02-04 LAB — CBC WITH DIFFERENTIAL/PLATELET
Basophils Absolute: 0.1 10*3/uL (ref 0.0–0.2)
Basos: 1 %
EOS (ABSOLUTE): 0.3 10*3/uL (ref 0.0–0.4)
Eos: 4 %
Hematocrit: 43.5 % (ref 34.0–46.6)
Hemoglobin: 14.6 g/dL (ref 11.1–15.9)
Immature Grans (Abs): 0 10*3/uL (ref 0.0–0.1)
Immature Granulocytes: 0 %
Lymphocytes Absolute: 5.1 10*3/uL — ABNORMAL HIGH (ref 0.7–3.1)
Lymphs: 59 %
MCH: 31.5 pg (ref 26.6–33.0)
MCHC: 33.6 g/dL (ref 31.5–35.7)
MCV: 94 fL (ref 79–97)
Monocytes Absolute: 0.6 10*3/uL (ref 0.1–0.9)
Monocytes: 7 %
Neutrophils Absolute: 2.5 10*3/uL (ref 1.4–7.0)
Neutrophils: 29 %
Platelets: 248 10*3/uL (ref 150–450)
RBC: 4.64 x10E6/uL (ref 3.77–5.28)
RDW: 12.6 % (ref 11.7–15.4)
WBC: 8.6 10*3/uL (ref 3.4–10.8)

## 2024-02-09 NOTE — Telephone Encounter (Signed)
 SABRA

## 2024-02-27 ENCOUNTER — Telehealth: Admitting: Nurse Practitioner

## 2024-02-27 DIAGNOSIS — B001 Herpesviral vesicular dermatitis: Secondary | ICD-10-CM

## 2024-02-27 MED ORDER — VALACYCLOVIR HCL 1 G PO TABS: 2000.0000 mg | ORAL_TABLET | Freq: Two times a day (BID) | ORAL | 0 refills | Status: AC

## 2024-02-27 NOTE — Progress Notes (Signed)
 I have spent 5 minutes in review of e-visit questionnaire, review and updating patient chart, medical decision making and response to patient.   Claiborne Rigg, NP

## 2024-02-27 NOTE — Progress Notes (Signed)

## 2024-03-04 ENCOUNTER — Ambulatory Visit (HOSPITAL_BASED_OUTPATIENT_CLINIC_OR_DEPARTMENT_OTHER)
Admission: EM | Admit: 2024-03-04 | Discharge: 2024-03-04 | Disposition: A | Discharge status: Home / Self Care | Attending: Family Medicine | Admitting: Family Medicine

## 2024-03-04 ENCOUNTER — Encounter (HOSPITAL_BASED_OUTPATIENT_CLINIC_OR_DEPARTMENT_OTHER): Payer: Self-pay

## 2024-03-04 DIAGNOSIS — R509 Fever, unspecified: Secondary | ICD-10-CM

## 2024-03-04 DIAGNOSIS — R52 Pain, unspecified: Secondary | ICD-10-CM | POA: Diagnosis not present

## 2024-03-04 LAB — POCT URINALYSIS DIP (MANUAL ENTRY)
Bilirubin, UA: NEGATIVE
Blood, UA: NEGATIVE
Glucose, UA: NEGATIVE mg/dL
Ketones, POC UA: NEGATIVE mg/dL
Leukocytes, UA: NEGATIVE
Nitrite, UA: NEGATIVE
Protein Ur, POC: NEGATIVE mg/dL
Spec Grav, UA: 1.015 (ref 1.010–1.025)
Urobilinogen, UA: 0.2 U/dL
pH, UA: 6 (ref 5.0–8.0)

## 2024-03-04 LAB — POC COVID19/FLU A&B COMBO: Covid Antigen, POC: NEGATIVE

## 2024-03-04 NOTE — ED Provider Notes (Signed)
 [name]    CSN: [id number] Arrival date & time: 03/04/24  0807      History   Chief Complaint Chief Complaint  Patient presents with   Fever    HPI Danielle Baker is a 28 y.o. female.    28 year old female presents today with fever onset last night. Muscle aching. Right side pain. Denies urinary symptoms but wonders if she could have a UTI. 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 No cough, no runny nose, ear pain, sore throat.    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) 11/09/2023   Neck pain 08/08/2020   Occipital headache 08/08/2020   Attention deficit 04/12/2020   Other fatigue 04/12/2020   Ketosis (HCC) 02/22/2020   Exacerbation of multiple sclerosis (HCC) 02/21/2020   Sleepiness 01/04/2020   High risk medication use 11/28/2019   Numbness 11/28/2019   Gait disturbance 11/28/2019   Urinary urgency 11/28/2019   Vitamin D  deficiency 11/28/2019   Multiple sclerosis (HCC) 11/07/2019   History of cesarean delivery 08/06/2018   Palpitations 03/30/2018   Maternal varicella, non-immune 01/04/2018   Pancreatitis 05/21/2016   Diabetes mellitus affecting pregnancy in third trimester 03/18/2016   DKA (diabetic ketoacidosis) (HCC) 08/31/2013   Childhood obesity 07/08/2012   Obesity, morbid (HCC)    Tachycardia    Hypertension associated with diabetes (HCC)    Type 1 diabetes mellitus without complication (HCC) 12/16/2010    Past Surgical History:  Procedure Laterality Date   CESAREAN SECTION N/A 03/17/2016   Procedure: CESAREAN SECTION;  Surgeon: Jolene Gaskins, MD;   Location: [facility];  Service: Obstetrics;  Laterality: N/A;  REQUEST RNFA   CESAREAN SECTION N/A 08/06/2018   Procedure: CESAREAN SECTION;  Surgeon: Gaskins Jolene, MD;  Location: [facility];  Service: Obstetrics;  Laterality: N/A;  RNFA AVAILABLE   CHOLECYSTECTOMY     IR IMAGING GUIDED PORT INSERTION  11/02/2020    OB History     Gravida  2   Para  2   Term  2   Preterm      AB      Living  2      SAB      IAB      Ectopic      Multiple  0   Live Births  2            Home Medications    Prior to Admission medications   Medication Sig Start Date End Date Taking? Authorizing Provider  acetaminophen  (TYLENOL ) 325 MG tablet Take 325 mg by mouth every 6 (six) hours as needed for mild pain.    [provider]  ALPRAZolam  (XANAX ) 0.5 MG tablet Take 2-3 before MRI 11/12/23   Sater, Charlie LABOR, MD  amphetamine -dextroamphetamine  (ADDERALL) 10 MG tablet Take 1 tablet (10 mg total) by mouth 2 (two) times daily with a meal. 12/15/23   Sater, Charlie LABOR, MD  Cholecalciferol (DIALYVITE VITAMIN D  5000) 125 MCG (5000 UT) capsule Take 5,000 Units by mouth daily.  [provider]  clindamycin -benzoyl peroxide (BENZACLIN) gel Apply topically 2 (two) times daily. 01/13/24   Gladis Elsie BROCKS, PA-C  ibuprofen  (ADVIL ) 200 MG tablet Take 200 mg by mouth every 6 (six) hours as needed for moderate pain.    [provider]  insulin  aspart (NOVOLOG ) 100 UNIT/ML injection Inject 10-23 Units into the skin 3 (three) times daily before meals.    [provider]  levonorgestrel (MIRENA, 52 MG,) 20 MCG/DAY IUD Mirena 20 mcg/24 hours (7 yrs) 52 mg intrauterine device  [facility]    [provider]  meclizine  (ANTIVERT ) 25 MG tablet Take 1 tablet (25 mg total) by mouth 3 (three) times daily as needed for dizziness. 04/10/22   Sater, Charlie LABOR, MD  natalizumab  (TYSABRI ) 300 MG/15ML injection Inject 300 mg as directed every 30  (thirty) days.    [provider]  ondansetron  (ZOFRAN  ODT) 4 MG disintegrating tablet Take 1 tablet (4 mg total) by mouth every 8 (eight) hours as needed for nausea or vomiting. 12/03/23   Sater, Charlie LABOR, MD  tirzepatide (ZEPBOUND) 10 MG/0.5ML Pen Inject 10 mg into the skin once a week.    [provider]  zonisamide  (ZONEGRAN ) 100 MG capsule Take 1 capsule (100 mg total) by mouth at bedtime. 12/30/22   Sater, Charlie LABOR, MD    Family History Family History  Problem Relation Age of Onset   Thyroid  disease Mother    Obesity Mother    Diabetes Father    Obesity Father    Obesity Sister    Cancer Maternal Grandmother    Obesity Paternal Grandfather    Multiple sclerosis Neg Hx     Social History Social History   Tobacco Use   Smoking status: Never   Smokeless tobacco: Never  Vaping Use   Vaping status: Never Used  Substance Use Topics   Alcohol use: No    Alcohol/week: 0.0 standard drinks of alcohol   Drug use: No     Allergies   Morphine  and codeine   Review of Systems Review of Systems  Constitutional:  Positive for fever.  See HPI   Physical Exam Triage Vital Signs ED Triage Vitals  Encounter Vitals Group     BP 03/04/24 0820 113/72     Girls Systolic BP Percentile --      Girls Diastolic BP Percentile --      Boys Systolic BP Percentile --      Boys Diastolic BP Percentile --      Pulse Rate 03/04/24 0820 88     Resp 03/04/24 0820 20     Temp 03/04/24 0820 97.7 F (36.5 C)     Temp Source 03/04/24 0820 Oral     SpO2 03/04/24 0820 98 %     Weight --      Height --      Head Circumference --      Peak Flow --      Pain Score 03/04/24 0822 3     Pain Loc --      Pain Education --      Exclude from Growth Chart --    No data found.  Updated Vital Signs BP 113/72 (BP Location: Right Arm)   Pulse 88   Temp 97.7 F (36.5 C) (Oral)   Resp 20   SpO2 98%   Visual Acuity Right Eye Distance:   Left Eye Distance:   Bilateral  Distance:    Right Eye Near:  Left Eye Near:    Bilateral Near:     Physical Exam Constitutional:      General: She is not in acute distress.    Appearance: Normal appearance. She is not ill-appearing, toxic-appearing or diaphoretic.  HENT:     Head: Normocephalic and atraumatic.     Right Ear: Tympanic membrane and ear canal normal.     Left Ear: Tympanic membrane and ear canal normal.     Mouth/Throat:     Pharynx: Oropharynx is clear.  Eyes:     Conjunctiva/sclera: Conjunctivae normal.  Cardiovascular:     Rate and Rhythm: Normal rate and regular rhythm.     Pulses: Normal pulses.     Heart sounds: Normal heart sounds.  Pulmonary:     Effort: Pulmonary effort is normal.     Breath sounds: Normal breath sounds.  Skin:    General: Skin is warm and dry.  Neurological:     Mental Status: She is alert.  Psychiatric:        Mood and Affect: Mood normal.      UC Treatments / Results  Labs (all labs ordered are listed, but only abnormal results are displayed) Labs Reviewed  POC COVID19/FLU A&B COMBO - Normal  POCT URINALYSIS DIP (MANUAL ENTRY)    EKG   Radiology No results found.  Procedures Procedures (including critical care time)  Medications Ordered in UC Medications - No data to display  Initial Impression / Assessment and Plan / UC Course  I have reviewed the triage vital signs and the nursing notes.  Pertinent labs & imaging results that were available during my care of the patient were reviewed by me and considered in my medical decision making (see chart for details).     Fever and body aches- exam without concerns. Flu/Covid negative.  Urine negative for infection.  Most likely something viral.  Recommend OTC meds as needed for symptoms  F/U as needed.  Final Clinical Impressions(s) / UC Diagnoses   Final diagnoses:  Fever, unspecified  Body aches     Discharge Instructions      Your covid and flu test are negative.  Follow up with  your doctor for continued issues.  OTC meds as needed     ED Prescriptions   None    PDMP not reviewed this encounter.   Adah Wilbert LABOR, FNP 03/04/24 1140

## 2024-03-04 NOTE — Discharge Instructions (Signed)
 Your covid and flu test are negative.  Follow up with your doctor for continued issues.  OTC meds as needed

## 2024-03-04 NOTE — ED Triage Notes (Signed)
 Fever onset last night. Muscle aching. Right side pain. Denies urinary symptoms but wonders if she could have a UTI. 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

## 2024-03-29 ENCOUNTER — Other Ambulatory Visit: Payer: Self-pay | Admitting: Neurology

## 2024-03-29 ENCOUNTER — Encounter: Payer: Self-pay | Admitting: Neurology

## 2024-03-30 ENCOUNTER — Other Ambulatory Visit: Payer: Self-pay

## 2024-03-30 MED ORDER — AMPHETAMINE-DEXTROAMPHETAMINE 10 MG PO TABS: 10.0000 mg | ORAL_TABLET | Freq: Two times a day (BID) | ORAL | 0 refills | Status: DC

## 2024-03-30 NOTE — Telephone Encounter (Signed)
 Pt Last Seen 11/09/2023 Upcoming Appointment 08/11/2023  Adderall Last filled 12/15/2023

## 2024-04-08 ENCOUNTER — Telehealth: Admitting: Physician Assistant

## 2024-04-08 DIAGNOSIS — T753XXA Motion sickness, initial encounter: Secondary | ICD-10-CM

## 2024-04-08 MED ORDER — SCOPOLAMINE 1 MG/3DAYS TD PT72: 1.0000 | MEDICATED_PATCH | TRANSDERMAL | 0 refills | Status: DC

## 2024-04-08 NOTE — Progress Notes (Signed)
E-Visit for Motion Sickness  We are sorry that you are not feeling well. Here is how we plan to help!  Based on what you have shared with me it looks like you have symptoms of motion sickness.  I have prescribed a medication that will help prevent or alleviate your symptoms:  Scopolamine Transdermal 1 mg patch behind ear at least 4 hours prior to travel (preferably 12 hours)   Prevention:  You might feel better if you keep your eyes focused on outside while you are in motion. For example, if you are in a car, sit in the front and look in the direction you are moving; if you are on a boat, stay on the deck and look to the horizon. This helps make what you see match the movement you are feeling, and so you are less likely to feel sick.  You should also avoid reading, watching a movie, texting or reading messages, or looking at things close to you inside the vehicle you are riding in.  Use the seat head rest. Lean your head against the back of the seat or head rest when traveling in vehicles with seats to minimize head movements.  On a ship: When making your reservations, choose a cabin in the middle of the ship and near the waterline. When on board, go up on deck and focus on the horizon.  In an airplane: Request a window seat and look out the window. A seat over the front edge of the wing is the most preferable spot (the degree of motion is the lowest here). Direct the air vent to blow cool air on your face.  On a train: Always face forward and sit near a window.  In a vehicle: Sit in the front seat; if you are the passenger, look at the scenery in the distance. For some people, driving the vehicle (rather than being a passenger) is an instant remedy.  Avoid others who have become nauseous with motion sickness. Seeing and smelling others who have motion sickness may cause you to become sick.  GET HELP RIGHT AWAY IF:  Your symptoms do not improve or worsen within 2 days after  treatment.  You cannot keep down fluids after trying the medication.  Other associated symptoms such as severe headache, visual field changes, fever, or intractable nausea and vomiting.  MAKE SURE YOU:  Understand these instructions. Will watch your condition. Will get help right away if you are not doing well or get worse.  Thank you for choosing an e-visit.  Your e-visit answers were reviewed by a board certified advanced clinical practitioner to complete your personal care plan. Depending upon the condition, your plan could have included both over the counter or prescription medications.  Please review your pharmacy choice. Be sure that the pharmacy you have chosen is open so that you can pick up your prescription now.  If there is a problem you may message your provider in [place] to have the prescription routed to another pharmacy.  Your safety is important to [place]. If you have drug allergies check your prescription carefully.   For the next 24 hours, you can use MyChart to ask questions about today's visit, request a non-urgent call back, or ask for a work or school excuse from your e-visit provider.  You will get an e-mail in the next two days asking about your experience. I hope that your e-visit has been valuable and will speed your recovery.   References or for more information:  ThenWeb.com.ee https://my.ResearchRoots.be https://www.uptodate.com   Approximately 5 minutes was spent documenting and reviewing patient's chart.

## 2024-04-26 ENCOUNTER — Other Ambulatory Visit: Payer: Self-pay | Admitting: *Deleted

## 2024-04-26 DIAGNOSIS — G35 Multiple sclerosis: Secondary | ICD-10-CM

## 2024-04-26 DIAGNOSIS — Z79899 Other long term (current) drug therapy: Secondary | ICD-10-CM

## 2024-04-27 ENCOUNTER — Telehealth: Payer: Self-pay

## 2024-04-27 ENCOUNTER — Other Ambulatory Visit

## 2024-04-27 NOTE — Telephone Encounter (Signed)
 Placed JCV in [facility] box on 04/27/2024

## 2024-04-28 LAB — CBC WITH DIFFERENTIAL/PLATELET
Basophils Absolute: 0.1 x10E3/uL (ref 0.0–0.2)
Basos: 1 %
EOS (ABSOLUTE): 0.4 x10E3/uL (ref 0.0–0.4)
Eos: 3 %
Hematocrit: 44.9 % (ref 34.0–46.6)
Hemoglobin: 15 g/dL (ref 11.1–15.9)
Immature Grans (Abs): 0.1 x10E3/uL (ref 0.0–0.1)
Immature Granulocytes: 1 %
Lymphocytes Absolute: 4.9 x10E3/uL — ABNORMAL HIGH (ref 0.7–3.1)
Lymphs: 43 %
MCH: 31.1 pg (ref 26.6–33.0)
MCHC: 33.4 g/dL (ref 31.5–35.7)
MCV: 93 fL (ref 79–97)
Monocytes Absolute: 0.9 x10E3/uL (ref 0.1–0.9)
Monocytes: 9 %
Neutrophils Absolute: 4.8 x10E3/uL (ref 1.4–7.0)
Neutrophils: 43 %
Platelets: 251 x10E3/uL (ref 150–450)
RBC: 4.83 x10E6/uL (ref 3.77–5.28)
RDW: 13.4 % (ref 11.7–15.4)
WBC: 11.1 x10E3/uL — ABNORMAL HIGH (ref 3.4–10.8)

## 2024-05-02 NOTE — Telephone Encounter (Signed)
 SABRA

## 2024-05-06 ENCOUNTER — Telehealth: Admitting: Physician Assistant

## 2024-05-06 DIAGNOSIS — R3989 Other symptoms and signs involving the genitourinary system: Secondary | ICD-10-CM

## 2024-05-06 MED ORDER — CEPHALEXIN 500 MG PO CAPS: 500.0000 mg | ORAL_CAPSULE | Freq: Two times a day (BID) | ORAL | 0 refills | Status: AC

## 2024-05-06 NOTE — Progress Notes (Signed)
 I have spent 5 minutes in review of e-visit questionnaire, review and updating patient chart, medical decision making and response to patient.   Laure Kidney, PA-C

## 2024-05-06 NOTE — Progress Notes (Signed)

## 2024-05-17 ENCOUNTER — Encounter: Payer: Self-pay | Admitting: Neurology

## 2024-05-17 MED ORDER — AMPHETAMINE-DEXTROAMPHETAMINE 10 MG PO TABS: 10.0000 mg | ORAL_TABLET | Freq: Two times a day (BID) | ORAL | 0 refills | Status: DC

## 2024-05-17 NOTE — Telephone Encounter (Signed)
 Last seen on 11/09/23 Follow up scheduled on 08/10/24   Dispensed Days Supply Quantity Provider Pharmacy  D-AMPHETAMINE  SALT COMBO 10MG  TAB 03/30/2024 30 60 each Sater, Charlie LABOR, MD [facility]     Rx pending to be signed

## 2024-05-22 ENCOUNTER — Other Ambulatory Visit: Payer: Self-pay | Admitting: Medical Genetics

## 2024-05-24 ENCOUNTER — Other Ambulatory Visit (HOSPITAL_COMMUNITY)
Admission: RE | Admit: 2024-05-24 | Discharge: 2024-05-24 | Disposition: A | Discharge status: Home / Self Care | Payer: Self-pay | Source: Ambulatory Visit | Attending: Medical Genetics | Admitting: Medical Genetics

## 2024-06-01 ENCOUNTER — Encounter: Payer: Self-pay | Admitting: Neurology

## 2024-06-04 LAB — GENECONNECT MOLECULAR SCREEN: Genetic Analysis Overall Interpretation: NEGATIVE

## 2024-06-18 ENCOUNTER — Encounter: Payer: Self-pay | Admitting: Neurology

## 2024-06-20 MED ORDER — AMPHETAMINE-DEXTROAMPHETAMINE 10 MG PO TABS: 10.0000 mg | ORAL_TABLET | Freq: Two times a day (BID) | ORAL | 0 refills | Status: DC

## 2024-06-20 NOTE — Telephone Encounter (Signed)
 Requested Prescriptions   Pending Prescriptions Disp Refills   amphetamine -dextroamphetamine  (ADDERALL) 10 MG tablet 60 tablet 0    Sig: Take 1 tablet (10 mg total) by mouth 2 (two) times daily with a meal.   Last seen 11/12/23 Next appt scheduled 08/24/24 Dispenses   Dispensed Days Supply Quantity Provider Pharmacy  D-AMPHETAMINE  SALT COMBO 10MG  TAB 05/17/2024 30 60 each Sater, Charlie LABOR, MD [facility]  D-AMPHETAMINE  SALT COMBO 10MG  TAB 03/30/2024 30 60 each Sater, Charlie LABOR, MD [facility]  D-AMPHETAMINE  SALT COMBO 10MG  TAB 12/15/2023 30 60 each Sater, Charlie LABOR, MD [facility]  D-AMPHETAMINE  SALT COMBO 10MG  TAB 11/09/2023 30 60 each Sater, Charlie LABOR, MD [facility]  D-AMPHETAMINE  SALT COMBO 10MG  TAB 09/23/2023 30 60 each Sater, Charlie LABOR, MD [facility]  D-AMPHETAMINE  SALT COMBO 10MG  TAB 08/17/2023 30 60 each Sater, Charlie LABOR, MD [facility]  D-AMPHETAMINE  SALT COMBO 10MG  TAB 07/21/2023 30 60 each Sater, Charlie LABOR, MD [facility]

## 2024-07-15 ENCOUNTER — Telehealth: Admitting: Family Medicine

## 2024-07-15 DIAGNOSIS — B001 Herpesviral vesicular dermatitis: Secondary | ICD-10-CM | POA: Diagnosis not present

## 2024-07-15 MED ORDER — VALACYCLOVIR HCL 1 G PO TABS: 2000.0000 mg | ORAL_TABLET | Freq: Two times a day (BID) | ORAL | 0 refills | Status: AC

## 2024-07-15 NOTE — Progress Notes (Signed)
 We are sorry that you are not feeling well.  Here is how we plan to help!  Based on what you have shared with me it does look like you have a viral infection.    Most cold sores or fever blisters are small fluid filled blisters around the mouth caused by herpes simplex virus.  The most common strain of the virus causing cold sores is herpes simplex virus 1.  It can be spread by skin contact, sharing eating utensils, or even sharing towels.  Cold sores are contagious to other people until dry. (Approximately 5-7 days).  Wash your hands. You can spread the virus to your eyes through handling your contact lenses after touching the lesions.  Most people experience pain at the sight or tingling sensations in their lips that may begin before the ulcers erupt.  Herpes simplex is treatable but not curable.  It may lie dormant for a long time and then reappear due to stress or prolonged sun exposure.  Many patients have success in treating their cold sores with an over the counter topical called Abreva.  You may apply the cream up to 5 times daily (maximum 10 days) until healing occurs.  If you would like to use an oral antiviral medication to speed the healing of your cold sore, I have sent a prescription to your local pharmacy Valacyclovir  2 gm take one by mouth twice a day for 1 day    HOME CARE:  Wash your hands frequently. Do not pick at or rub the sore. Don't open the blisters. Avoid kissing other people during this time. Avoid sharing drinking glasses, eating utensils, or razors. Do not handle contact lenses unless you have thoroughly washed your hands with soap and warm water ! Avoid oral sex during this time.  Herpes from sores on your mouth can spread to your partner's genital area. Avoid contact with anyone who has eczema or a weakened immune system. Cold sores are often triggered by exposure to intense sunlight, use a lip balm containing a sunscreen (SPF 30 or higher).  GET HELP RIGHT AWAY  IF:  Blisters look infected. Blisters occur near or in the eye. Symptoms last longer than 10 days. Your symptoms become worse.  MAKE SURE YOU:  Understand these instructions. Will watch your condition. Will get help right away if you are not doing well or get worse.    Your e-visit answers were reviewed by a board certified advanced clinical practitioner to complete your personal care plan.  Depending upon the condition, your plan could have  Included both over the counter or prescription medications.    Please review your pharmacy choice.  Be sure that the pharmacy you have chosen is open so that you can pick up your prescription now.  If there is a problem you can message your provider in MyChart to have the prescription routed to another pharmacy.    Your safety is important to us .  If you have drug allergies check our prescription carefully.  For the next 24 hours you can use MyChart to ask questions about today's visit, request a non-urgent call back, or ask for a work or school excuse from your e-visit provider.  You will get an email in the next two days asking about your experience.  I hope that your e-visit has been valuable and will speed your recovery.   I have spent 5 minutes in review of e-visit questionnaire, review and updating patient chart, medical decision making and response to patient.  Fayelynn Distel, FNP

## 2024-07-19 ENCOUNTER — Other Ambulatory Visit: Payer: Self-pay | Admitting: *Deleted

## 2024-07-19 DIAGNOSIS — G35D Multiple sclerosis, unspecified: Secondary | ICD-10-CM

## 2024-07-19 DIAGNOSIS — Z79899 Other long term (current) drug therapy: Secondary | ICD-10-CM

## 2024-07-19 DIAGNOSIS — G35A Relapsing-remitting multiple sclerosis: Secondary | ICD-10-CM

## 2024-07-20 ENCOUNTER — Encounter: Payer: Self-pay | Admitting: Neurology

## 2024-07-20 ENCOUNTER — Other Ambulatory Visit

## 2024-07-20 ENCOUNTER — Other Ambulatory Visit: Payer: Self-pay

## 2024-07-20 DIAGNOSIS — G35D Multiple sclerosis, unspecified: Secondary | ICD-10-CM

## 2024-07-20 DIAGNOSIS — G35A Relapsing-remitting multiple sclerosis: Secondary | ICD-10-CM

## 2024-07-20 DIAGNOSIS — Z79899 Other long term (current) drug therapy: Secondary | ICD-10-CM

## 2024-07-21 MED ORDER — AMPHETAMINE-DEXTROAMPHETAMINE 10 MG PO TABS: 10.0000 mg | ORAL_TABLET | Freq: Two times a day (BID) | ORAL | 0 refills | Status: DC

## 2024-07-21 NOTE — Telephone Encounter (Signed)
 Requested Prescriptions   Pending Prescriptions Disp Refills   amphetamine -dextroamphetamine  (ADDERALL) 10 MG tablet 60 tablet 0    Sig: Take 1 tablet (10 mg total) by mouth 2 (two) times daily with a meal.   Last seen 11/09/23 Next appt 08/24/24  Dispenses   Dispensed Days Supply Quantity Provider Pharmacy  D-AMPHETAMINE  SALT COMBO 10MG  TAB 06/20/2024 30 60 each Sater, Charlie LABOR, MD [facility]  D-AMPHETAMINE  SALT COMBO 10MG  TAB 05/17/2024 30 60 each Sater, Charlie LABOR, MD [facility]  D-AMPHETAMINE  SALT COMBO 10MG  TAB 03/30/2024 30 60 each Sater, Charlie LABOR, MD [facility]  D-AMPHETAMINE  SALT COMBO 10MG  TAB 12/15/2023 30 60 each Sater, Charlie LABOR, MD [facility]  D-AMPHETAMINE  SALT COMBO 10MG  TAB 11/09/2023 30 60 each Sater, Charlie LABOR, MD [facility]  D-AMPHETAMINE  SALT COMBO 10MG  TAB 09/23/2023 30 60 each Sater, Charlie LABOR, MD [facility]  D-AMPHETAMINE  SALT COMBO 10MG  TAB 08/17/2023 30 60 each Sater, Charlie LABOR, MD [facility]  D-AMPHETAMINE  SALT COMBO 10MG  TAB 07/21/2023 30 60 each Sater, Charlie LABOR, MD [facility]

## 2024-07-26 ENCOUNTER — Telehealth: Admitting: Family Medicine

## 2024-07-26 DIAGNOSIS — Z20828 Contact with and (suspected) exposure to other viral communicable diseases: Secondary | ICD-10-CM

## 2024-07-26 DIAGNOSIS — R051 Acute cough: Secondary | ICD-10-CM

## 2024-07-26 DIAGNOSIS — R6889 Other general symptoms and signs: Secondary | ICD-10-CM

## 2024-07-26 MED ORDER — IPRATROPIUM BROMIDE 0.03 % NA SOLN: 2.0000 | Freq: Two times a day (BID) | NASAL | 0 refills | Status: AC

## 2024-07-26 MED ORDER — OSELTAMIVIR PHOSPHATE 75 MG PO CAPS: 75.0000 mg | ORAL_CAPSULE | Freq: Two times a day (BID) | ORAL | 0 refills | Status: AC

## 2024-07-26 MED ORDER — BENZONATATE 100 MG PO CAPS: 100.0000 mg | ORAL_CAPSULE | Freq: Three times a day (TID) | ORAL | 0 refills | Status: AC | PRN

## 2024-07-26 NOTE — Progress Notes (Signed)
 E visit for Flu like symptoms   We are sorry that you are not feeling well.  Here is how we plan to help! Based on what you have shared with me it looks like you may have possible exposure to a virus that causes influenza.  Influenza or "the flu" is  an infection caused by a respiratory virus. The flu virus is highly contagious and persons who did not receive their yearly flu vaccination may "catch" the flu from close contact.  We have anti-viral medications to treat the viruses that cause this infection. They are not a "cure" and only shorten the course of the infection. These prescriptions are most effective when they are given within the first 2 days of "flu" symptoms. Antiviral medications are indicated if you have a high risk of complications from the flu. You should  also consider an antiviral medication if you are in close contact with someone who is at risk. These medications can help patients avoid complications from the flu but have side effects that you should know.   Possible side effects from Tamiflu  or oseltamivir  include nausea, vomiting, diarrhea, dizziness, headaches, eye redness, sleep problems or other respiratory symptoms. You should not take Tamiflu  if you have an allergy to oseltamivir  or any to the ingredients in Tamiflu .  Based upon your symptoms and potential risk factors I have prescribed Oseltamivir  (Tamiflu ).  It has been sent to your designated pharmacy.  You will take one 75 mg capsule orally twice a day for the next 5 days.   For nasal congestion, you may use an oral decongestant such as Mucinex  D or if you have glaucoma or high blood pressure use plain Mucinex .  Saline nasal spray or nasal drops can help and can safely be used as often as needed for congestion.  If you have a sore or scratchy throat, use a saltwater gargle-  to  teaspoon of salt dissolved in a 4-ounce to 8-ounce glass of warm water.  Gargle the solution for approximately 15-30 seconds and then spit.   It is important not to swallow the solution.  You can also use throat lozenges/cough drops and Chloraseptic spray to help with throat pain or discomfort.  Warm or cold liquids can also be helpful in relieving throat pain.  For headache, pain or general discomfort, you can use Ibuprofen or Tylenol  as directed.   Some authorities believe that zinc sprays or the use of Echinacea may shorten the course of your symptoms.  I have prescribed the following medications to help lessen symptoms: I have prescribed Tessalon  Perles 100 mg. You may take 1-2 capsules every 8 hours as needed for cough  You are to isolate at home until you have been fever-free for at least 24 hours without a fever-reducing medication, and symptoms have been steadily improving for 24 hours.  If you must be around other household members who do not have symptoms, you need to make sure that both you and the family members are masking consistently with a high-quality mask.  If you note any worsening of symptoms despite treatment, please seek an in-person evaluation ASAP. If you note any significant shortness of breath or any chest pain, please seek ED evaluation. Please do not delay care!  ANYONE WHO HAS FLU SYMPTOMS SHOULD: Stay home. The flu is highly contagious and going out or to work exposes others! Be sure to drink plenty of fluids. Water is fine as well as fruit juices, sodas and electrolyte beverages. You may want to  stay away from caffeine or alcohol . If you are nauseated, try taking small sips of liquids. How do you know if you are getting enough fluid? Your urine should be a pale yellow or almost colorless. Get rest. Taking a steamy shower or using a humidifier may help nasal congestion and ease sore throat pain. Using a saline nasal spray works much the same way. Cough drops, hard candies and sore throat lozenges may ease your cough. Line up a caregiver. Have someone check on you regularly.  GET HELP RIGHT AWAY IF: You  cannot keep down liquids or your medications. You become short of breath Your fell like you are going to pass out or loose consciousness. Your symptoms persist after you have completed your treatment plan  MAKE SURE YOU  Understand these instructions. Will watch your condition. Will get help right away if you are not doing well or get worse.  Your e-visit answers were reviewed by a board certified advanced clinical practitioner to complete your personal care plan.  Depending on the condition, your plan could have included both over the counter or prescription medications.  If there is a problem please reply  once you have received a response from your provider.  Your safety is important to us .  If you have drug allergies check your prescription carefully.    You can use MyChart to ask questions about today's visit, request a non-urgent call back, or ask for a work or school excuse for 24 hours related to this e-Visit. If it has been greater than 24 hours you will need to follow up with your provider, or enter a new e-Visit to address those concerns.  You will get an e-mail in the next two days asking about your experience.  I hope that your e-visit has been valuable and will speed your recovery. Thank you for using e-visits.   I have spent 5 minutes in review of e-visit questionnaire, review and updating patient chart, medical decision making and response to patient.   Chiquita CHRISTELLA Barefoot, NP

## 2024-07-28 ENCOUNTER — Ambulatory Visit: Payer: Self-pay | Admitting: Neurology

## 2024-07-28 LAB — CBC WITH DIFFERENTIAL/PLATELET
Basophils Absolute: 0.1 x10E3/uL (ref 0.0–0.2)
Basos: 1 %
EOS (ABSOLUTE): 0.5 x10E3/uL — ABNORMAL HIGH (ref 0.0–0.4)
Eos: 5 %
Hematocrit: 46.3 % (ref 34.0–46.6)
Hemoglobin: 15.4 g/dL (ref 11.1–15.9)
Immature Grans (Abs): 0.1 x10E3/uL (ref 0.0–0.1)
Immature Granulocytes: 1 %
Lymphocytes Absolute: 4.7 x10E3/uL — ABNORMAL HIGH (ref 0.7–3.1)
Lymphs: 49 %
MCH: 31.2 pg (ref 26.6–33.0)
MCHC: 33.3 g/dL (ref 31.5–35.7)
MCV: 94 fL (ref 79–97)
Monocytes Absolute: 0.8 x10E3/uL (ref 0.1–0.9)
Monocytes: 9 %
Neutrophils Absolute: 3.4 x10E3/uL (ref 1.4–7.0)
Neutrophils: 35 %
Platelets: 291 x10E3/uL (ref 150–450)
RBC: 4.93 x10E6/uL (ref 3.77–5.28)
RDW: 12.4 % (ref 11.7–15.4)
WBC: 9.6 x10E3/uL (ref 3.4–10.8)

## 2024-07-28 LAB — STRATIFY JCV(TM) AB W/INDEX: JCV Index Value: 0.39

## 2024-08-10 ENCOUNTER — Ambulatory Visit: Admitting: Neurology

## 2024-08-24 ENCOUNTER — Ambulatory Visit: Admitting: Neurology

## 2024-08-24 ENCOUNTER — Encounter: Payer: Self-pay | Admitting: Neurology

## 2024-08-24 VITALS — BP 106/64 | HR 78 | Ht 64.0 in | Wt 158.2 lb

## 2024-08-24 DIAGNOSIS — R200 Anesthesia of skin: Secondary | ICD-10-CM

## 2024-08-24 DIAGNOSIS — R42 Dizziness and giddiness: Secondary | ICD-10-CM | POA: Diagnosis not present

## 2024-08-24 DIAGNOSIS — Z79899 Other long term (current) drug therapy: Secondary | ICD-10-CM | POA: Diagnosis not present

## 2024-08-24 DIAGNOSIS — R269 Unspecified abnormalities of gait and mobility: Secondary | ICD-10-CM | POA: Diagnosis not present

## 2024-08-24 DIAGNOSIS — G35A Relapsing-remitting multiple sclerosis: Secondary | ICD-10-CM

## 2024-08-24 DIAGNOSIS — R41840 Attention and concentration deficit: Secondary | ICD-10-CM

## 2024-08-24 MED ORDER — ZONISAMIDE 100 MG PO CAPS: ORAL_CAPSULE | ORAL | 3 refills | Status: AC

## 2024-08-24 MED ORDER — AMPHETAMINE-DEXTROAMPHETAMINE 10 MG PO TABS: 10.0000 mg | ORAL_TABLET | Freq: Two times a day (BID) | ORAL | 0 refills | Status: AC

## 2024-08-24 NOTE — Progress Notes (Signed)
 "  [facility]  PATIENT: Danielle Baker DOB: [birth year]  REFERRING DOCTOR OR PCP:   Landry Georgi, MD (PCP) SOURCE: Patient, notes from both hospital admissions, imaging and laboratory reports, MRI images personally reviewed.  _________________________________   HISTORICAL  CHIEF COMPLAINT:  Chief Complaint  Patient presents with   Follow-up    Pt in room 11. Alone. Here for MS follow up.    HISTORY OF PRESENT ILLNESS: She is a 29 y.o. woman with relapsing remitting MS  Update 08/24/2024 She is on Tysabri  and has tolerated it well with no exacerbation or new neurologic symptoms. Last infusion was Thursday last week.  On Friday, she had a HA, pressure sensation and mild eye blurriness.   Vertigo started later in the day.  Vertigo is rotational.   She has nausea but not vomiting.   She called out of work due to the symptoms.    There is no positional component to the vertigo.   She does not have diplopia  She has taken meclizine .   She felt worse on FRiday but feels the same as yesterday.  The headache is now mild.       Gait is doing okay with only slight imbalance at times.   She usually does not need to use the bannister on stairs.   She can walk 2-3 miles without a rest.   She experiences  mild left arm > leg numbness.  This is stable but the more painful dysesthesias have resolved.    She gets occasional vertigo helped by meclizine .    Fatigue has generally done well with Tysabri  and Adderall.  Mood is better on sertraline (anxiety was more issue than depression)   Cognitive issues like reduced STM, verbal fluency but these are better with Adderall 10 mg po twice a day.    Sleeps ok most nights.  Migraines have improved with zonisamide   She is low positive JCV (0.42) and is on q 6 weeks.   She had one titer of 1.0 but last titer was 0.47 (4/24)    We discussed her risk of PML.   Initially, she did Ocrevus but felt poorly on it.   She was very tired on Ocrevus so  switched to Tysabri  in March 2022.     Vit D was low and she is taking 5000 U daily (was on 50000 weekly)    She has Type 1 IDDM and has a new insulin  pump.  Last HgbA1c was 7.3.  Se has lost 60 pounds  She stopped Zepbound and has kept her weight off with more  exercise.  MS History: She had the rapid onset of  left arm and flank numbness on 11/07/2019, and to a lesser extent in the right arm.  In retrospect she had some dysesthetic sensation in her trunk 2 weeks earlier.    She went to [facility] and was admitted for IV Solumedrol.   Two weeks later, she had left visual changes  and more numbness bilaterally but more on her right side and also had left facial numbness.   She has a Lhermitte sign into the right leg with the second episode last week.    She received 3 days IV Solu-Medrol  and noted improvement but feels the Lhermitte sign and numbness are worse since then.   She started Ocrevus June/July 2021.   She switched to Tysabri  in 2023.     She has no FH of MS.    Data reviewed: MRI brain  06/25/2022 showed T2/FLAIR hyperintense foci in the hemispheres, brainstem and cerebellum in a pattern and configuration consistent with chronic demyelinating plaque associated with multiple sclerosis.  None of the foci enhanced or appear to be acute.  Compared to the MRI from 06/20/2020, there were no new lesions.    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  None of the foci appear to be acute.  Compared to the MRI from 11/07/2019, there do not appear to be any new lesions.  MRI cervical spine 06/20/2020 showed T2 hyperintense foci within the spinal cord posteriorly adjacent to C3-C4, to the left adjacent to T1 and adjacent  to T2 consistent with chronic demyelinating plaque associated with multiple sclerosis.  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  There are also very minimal stable disc bulges at C3-C4 and C5-C6.  There is no nerve root compression.  MRI thoracic spine 06/20/2020 showed Foci within the spinal cord adjacent to T1 and T3-T4 as detailed above.  A third focus adjacent to T2 was noted on the cervical spine images but not clearly apparent on the thoracic spine images.  All 3 of these foci were present on MRIs from 11/07/2019 and all consistent with chronic demyelinating plaque associated with multiple sclerosis.  There are no new lesions.  MRI of the cervical and thoracic spine 11/07/2019 showed enhancing foci at C3C4 posteriorly to the left and T1 and non-enhancing foci centrally at C1-C2, T2 centrally and T3T4 towards the right.   Brain MRI showed scattered foci in the MCP's, left cerebellar hemisphere, pons, left thalamus and periventricular and juxtacortical white matter.   Several foci in the hemispheres and in the left middle cerebellar peduncle enhanced after contrast.    MRI of the brain and orbits 11/22/2019 showed an additional enhancing lesion in the left posterior lateral pons.    Orbits were normal.    Labs showed negative HIV and NMO Ab.   Vit D was 11 (now being supplemented).   REVIEW OF SYSTEMS: Constitutional: No fevers, chills, sweats, or change in appetite.  She has fatigue. Eyes: No visual changes, double vision, eye pain Ear, nose and throat: No hearing loss, ear pain, nasal congestion, sore throat Cardiovascular: No chest pain, palpitations Respiratory:  No shortness of breath at rest or with exertion.   No wheezes GastrointestinaI: No nausea, vomiting, diarrhea, abdominal pain, fecal incontinence Genitourinary:   Urinary urgency and frequency. Musculoskeletal:  No neck pain, back  pain Integumentary: No rash, pruritus, skin lesions Neurological: as above Psychiatric: Anxiety and mild depression. Endocrine: She is a type I diabetic. Hematologic/Lymphatic:  No anemia, purpura, petechiae. Allergic/Immunologic: No itchy/runny eyes, nasal congestion, recent allergic reactions, rashes  ALLERGIES: Allergies  Allergen Reactions   Morphine  And Codeine Rash    HOME MEDICATIONS:  Current Outpatient Medications:    acetaminophen  (TYLENOL ) 325 MG tablet, Take 325 mg by mouth every 6 (six) hours as needed for mild pain., Disp: , Rfl:    Cholecalciferol (DIALYVITE VITAMIN D  5000) 125 MCG (5000 UT) capsule, Take 5,000 Units by mouth daily., Disp: , Rfl:    ibuprofen  (ADVIL ) 200 MG tablet, Take 200 mg by mouth every 6 (  six) hours as needed for moderate pain., Disp: , Rfl:    Insulin  Lispro-aabc (LYUMJEV ) 100 UNIT/ML SOLN, Inject 100 Units as directed. Insulin  pump, Disp: , Rfl:    ipratropium (ATROVENT ) 0.03 % nasal spray, Place 2 sprays into both nostrils every 12 (twelve) hours., Disp: 30 mL, Rfl: 0   levonorgestrel (MIRENA, 52 MG,) 20 MCG/DAY IUD, Mirena 20 mcg/24 hours (7 yrs) 52 mg intrauterine device  provided by [facility], Disp: , Rfl:    meclizine  (ANTIVERT ) 25 MG tablet, Take 1 tablet (25 mg total) by mouth 3 (three) times daily as needed for dizziness., Disp: 90 tablet, Rfl: 0   natalizumab  (TYSABRI ) 300 MG/15ML injection, Inject 300 mg as directed every 30 (thirty) days., Disp: , Rfl:    ondansetron  (ZOFRAN  ODT) 4 MG disintegrating tablet, Take 1 tablet (4 mg total) by mouth every 8 (eight) hours as needed for nausea or vomiting., Disp: 20 tablet, Rfl: 5   sertraline (ZOLOFT) 50 MG tablet, Take 50 mg by mouth daily., Disp: , Rfl:    ALPRAZolam  (XANAX ) 0.5 MG tablet, Take 2-3 before MRI, Disp: 3 tablet, Rfl: 0   amphetamine -dextroamphetamine  (ADDERALL) 10 MG tablet, Take 1 tablet (10 mg total) by mouth 2 (two) times daily with a meal., Disp: 60 tablet, Rfl: 0    benzonatate  (TESSALON ) 100 MG capsule, Take 1 capsule (100 mg total) by mouth 3 (three) times daily as needed for cough. (Patient not taking: Reported on 08/24/2024), Disp: 30 capsule, Rfl: 0   clindamycin -benzoyl peroxide (BENZACLIN) gel, Apply topically 2 (two) times daily., Disp: 25 g, Rfl: 0   scopolamine  (TRANSDERM-SCOP) 1 MG/3DAYS, Place 1 patch (1 mg total) onto the skin every 3 (three) days., Disp: 5 patch, Rfl: 0   tirzepatide (ZEPBOUND) 10 MG/0.5ML Pen, Inject 10 mg into the skin once a week. (Patient not taking: Reported on 08/24/2024), Disp: , Rfl:    zonisamide  (ZONEGRAN ) 100 MG capsule, One po qHS, Disp: 90 capsule, Rfl: 3  PAST MEDICAL HISTORY: Past Medical History:  Diagnosis Date   Heart murmur    as a child   Hypoglycemia associated with diabetes (HCC)    Multiple sclerosis    Obesity, morbid (HCC)    Pancreatitis    Tachycardia    Type 1 diabetes mellitus not at goal [facility])    since age of 29 years old    PAST SURGICAL HISTORY: Past Surgical History:  Procedure Laterality Date   CESAREAN SECTION N/A 03/17/2016   Procedure: CESAREAN SECTION;  Surgeon: Jolene Gaskins, MD;  Location: [facility];  Service: Obstetrics;  Laterality: N/A;  REQUEST RNFA   CESAREAN SECTION N/A 08/06/2018   Procedure: CESAREAN SECTION;  Surgeon: Gaskins Jolene, MD;  Location: [facility] BIRTHING SUIT[facility];  Service: Obstetrics;  Laterality: N/A;  RNFA AVAILABLE   CHOLECYSTECTOMY     IR IMAGING GUIDED PORT INSERTION  11/02/2020    FAMILY HISTORY: Family History  Problem Relation Age of Onset   Thyroid  disease Mother    Obesity Mother    Diabetes Father    Obesity Father    Obesity Sister    Cancer Maternal Grandmother    Obesity Paternal Grandfather    Multiple sclerosis Neg Hx     SOCIAL HISTORY:  Social History   Socioeconomic History   Marital status: Married    Spouse name: Not on file   Number of children: Not on file   Years of education: Not on file   Highest education level: Not  on file  Occupational History   Not on file  Tobacco Use   Smoking status: Never   Smokeless tobacco: Never  Vaping Use   Vaping status: Never Used  Substance and Sexual Activity   Alcohol use: No    Alcohol/week: 0.0 standard drinks of alcohol   Drug use: No   Sexual activity: Yes    Birth control/protection: None  Other Topics Concern   Not on file  Social History Narrative   Right handed   Caffeine  use: none   Newly married   [employer] - online   Works part time at a daycare   Works at a [employer] time   Social Drivers of Health   Tobacco Use: Low Risk (08/24/2024)   Patient History    Smoking Tobacco Use: Never    Smokeless Tobacco Use: Never    Passive Exposure: Not on file  Financial Resource Strain: Not on file  Food Insecurity: Not on file  Transportation Needs: Not on file  Physical Activity: Not on file  Stress: Not on file  Social Connections: Unknown (06/14/2022)   Received from [facility]   Social Network    Social Network: Not on file  Intimate Partner Violence: Unknown (06/14/2022)   Received from [facility]   HITS    Physically Hurt: Not on file    Insult or Talk Down To: Not on file    Threaten Physical Harm: Not on file    Scream or Curse: Not on file  Depression (PHQ2-9): Not on file  Alcohol Screen: Not on file  Housing: Not on file  Utilities: Not on file  Health Literacy: Not on file     PHYSICAL EXAM  Vitals:   08/24/24 1119  BP: 106/64  Pulse: 78  Weight: 158 lb 3.2 oz (71.8 kg)  Height: 5' 4 (1.626 m)    Body mass index is 27.15 kg/m.  No results found.    General: The patient is well-developed and well-nourished and in no acute distress  HEENT:  Head is [place]/AT.  Sclera are anicteric.    Skin: Extremities are without rash or edema.  Neurologic Exam  Mental status: The patient is alert and oriented x 3 at the time of the examination.  Speech is normal.  Cranial nerves: Extraocular movements  are full.   Facial symmetry is present. She has mildly reduced left facial sensation to soft touch and temperature.    .Facial strength is normal.  Trapezius and sternocleidomastoid strength is normal. No dysarthria is noted.   No obvious hearing deficits are noted.  Motor:  Muscle bulk is normal.   Tone is normal. Strength is  5 / 5.  Sensory: She reports reduced touch, temperature and vibration sensation in the left arm and left leg     Coordination: Cerebellar testing reveals good finger-nose-finger right and slightly reduced heel-to-shin bilaterally.  Gait and station: Station is normal.   Her gait is normal.  The tandem is mildly wide..  Romberg is negative.   Reflexes: Deep tendon reflexes are symmetric and normal bilaterally.         DIAGNOSTIC DATA (LABS, IMAGING, TESTING) - I reviewed patient records, labs, notes, testing and imaging myself where available.  Lab Results  Component Value Date   WBC 9.6 07/20/2024   HGB 15.4 07/20/2024   HCT 46.3 07/20/2024   MCV 94 07/20/2024   PLT 291 07/20/2024      Component Value Date/Time   NA 139 02/23/2020 0436  K 4.2 02/23/2020 0436   CL 111 02/23/2020 0436   CO2 19 (L) 02/23/2020 0436   GLUCOSE 232 (H) 02/23/2020 0436   BUN 15 02/23/2020 0436   CREATININE 0.58 02/23/2020 0436   CREATININE 0.62 03/22/2015 1210   CALCIUM 8.3 (L) 02/23/2020 0436   PROT 6.2 09/24/2020 1345   ALBUMIN 4.0 09/24/2020 1345   AST 14 09/24/2020 1345   ALT 16 09/24/2020 1345   ALKPHOS 123 (H) 09/24/2020 1345   BILITOT 0.3 09/24/2020 1345   GFRNONAA >60 02/23/2020 0436   GFRNONAA >89 03/22/2015 1210   GFRAA >60 02/23/2020 0436   GFRAA >89 03/22/2015 1210   Lab Results  Component Value Date   CHOL 160 03/22/2015   HDL 35.30 (L) 03/22/2015   LDLCALC 95 03/22/2015   TRIG 151.0 (H) 03/22/2015   CHOLHDL 5 03/22/2015   Lab Results  Component Value Date   HGBA1C 10.0 (H) 02/21/2020   Lab Results  Component Value Date   VITAMINB12 229  11/07/2019   Lab Results  Component Value Date   TSH 2.716 11/07/2019       ASSESSMENT AND PLAN  Multiple sclerosis, relapsing-remitting  High risk medication use  Attention deficit  Numbness  Vertigo  Gait disturbance  1.   We will continue Tysabri  every 6 weeks (has been JCV Ab positive, though most recent titer was 0.39 negative)  As long as she remains under 0.9, we will continue the Tysabri .  However, if the titer goes up and stays up we will need to consider switching to Briumvi or Kesimpta) (as has difficulty tolerating OCrevus) or different medication. 2.   Continue Adderall for ADD and sleepiness/fatigue 3.   Continue zonisamide  for migraine rtc 6 months, sooner if new or worsening neurologic function   Keniyah Gelinas A. Vear, MD, [facility] 08/24/2024, 12:54 PM Certified in Neurology, Clinical Neurophysiology, Sleep Medicine and Neuroimaging  [facility] [address] [place], [place] [address] [phone] "

## 2025-04-26 ENCOUNTER — Ambulatory Visit: Admitting: Neurology
